# Patient Record
Sex: Female | Born: 1975 | Race: White | Hispanic: No | Marital: Married | Smoking: Never smoker
Health system: Southern US, Community
[De-identification: ages and names within clinical notes are randomized; demographics above are authoritative.]

## PROBLEM LIST (undated history)

## (undated) DIAGNOSIS — F32A Depression, unspecified: Secondary | ICD-10-CM

## (undated) DIAGNOSIS — Z808 Family history of malignant neoplasm of other organs or systems: Secondary | ICD-10-CM

## (undated) DIAGNOSIS — F419 Anxiety disorder, unspecified: Secondary | ICD-10-CM

## (undated) DIAGNOSIS — F329 Major depressive disorder, single episode, unspecified: Secondary | ICD-10-CM

## (undated) DIAGNOSIS — Z803 Family history of malignant neoplasm of breast: Secondary | ICD-10-CM

## (undated) HISTORY — DX: Anxiety disorder, unspecified: F41.9

## (undated) HISTORY — DX: Family history of malignant neoplasm of breast: Z80.3

## (undated) HISTORY — PX: NO PAST SURGERIES: SHX2092

## (undated) HISTORY — DX: Family history of malignant neoplasm of other organs or systems: Z80.8

## (undated) HISTORY — DX: Depression, unspecified: F32.A

## (undated) HISTORY — DX: Major depressive disorder, single episode, unspecified: F32.9

---

## 1998-11-23 ENCOUNTER — Other Ambulatory Visit: Admission: RE | Admit: 1998-11-23 | Discharge: 1998-11-23 | Payer: Self-pay | Admitting: Obstetrics and Gynecology

## 1999-11-22 ENCOUNTER — Other Ambulatory Visit: Admission: RE | Admit: 1999-11-22 | Discharge: 1999-11-22 | Payer: Self-pay | Admitting: Obstetrics and Gynecology

## 2001-01-01 ENCOUNTER — Other Ambulatory Visit: Admission: RE | Admit: 2001-01-01 | Discharge: 2001-01-01 | Payer: Self-pay | Admitting: Obstetrics and Gynecology

## 2002-01-21 ENCOUNTER — Other Ambulatory Visit: Admission: RE | Admit: 2002-01-21 | Discharge: 2002-01-21 | Payer: Self-pay | Admitting: Obstetrics and Gynecology

## 2002-02-14 ENCOUNTER — Ambulatory Visit (HOSPITAL_COMMUNITY): Admission: RE | Admit: 2002-02-14 | Discharge: 2002-02-14 | Payer: Self-pay | Admitting: Obstetrics and Gynecology

## 2002-02-14 ENCOUNTER — Encounter: Payer: Self-pay | Admitting: Obstetrics and Gynecology

## 2003-01-27 ENCOUNTER — Other Ambulatory Visit: Admission: RE | Admit: 2003-01-27 | Discharge: 2003-01-27 | Payer: Self-pay | Admitting: Obstetrics and Gynecology

## 2003-05-25 ENCOUNTER — Inpatient Hospital Stay (HOSPITAL_COMMUNITY): Admission: AD | Admit: 2003-05-25 | Discharge: 2003-05-25 | Payer: Self-pay | Admitting: Obstetrics and Gynecology

## 2003-05-25 IMAGING — US US OB COMP LESS 14 WK
1 series · 18 of 28 positions shown · non-contrast
Comparison: none

CLINICAL DATA: Abnormal uterine bleeding.
 TRANSABDOMINAL AND TRANSVAGINAL PELVIC OBSTETRICAL ULTRASOUND <14 WEEKS 
 There is a gestational sac with a mean sac diameter of 1 cm consistent with 5 weeks 4 days gestation.  A yolk sac is visible.  An embryo was not currently visible.  There is a small subchorionic hemorrhage.  The right ovary is normal.  There is a 1.6 x 1.4 x 2.0 cm hemorrhagic corpus luteum cyst on the left ovary.  There is no free fluid.  Transverse images of the uterus indicate that the patient appears to have a septated uterus.  The external contour of the fundus does not suggest a bicornuate uterus.  
 IMPRESSION
 Septated endometrial cavity.  Early pregnancy in the left horn.  Small subchorionic hemorrhage.

[Series 1: us ob comp<14 wk · 18 of 44 slices shown]
[im 1/44]
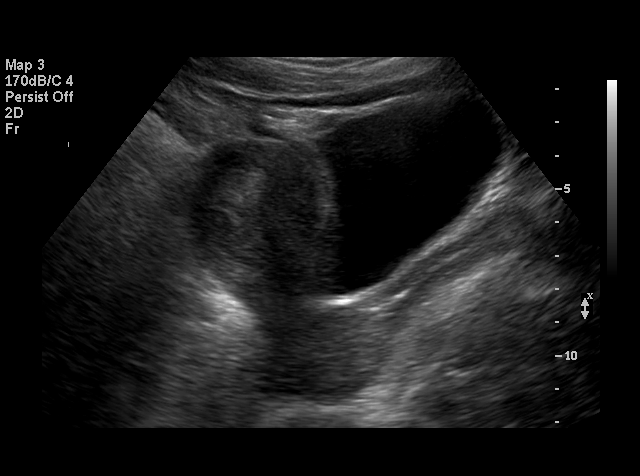
[im 4/44]
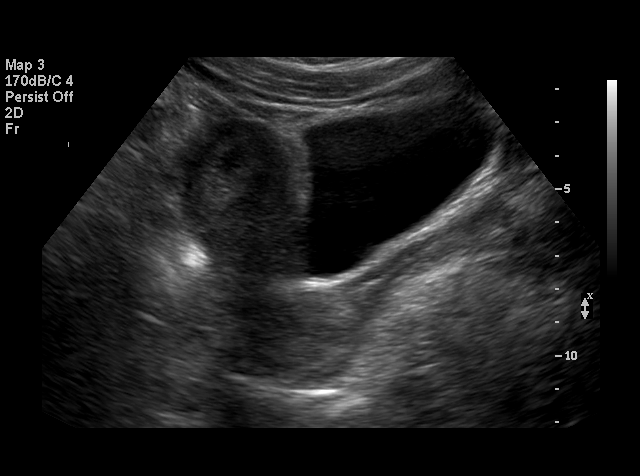
[im 5/44]
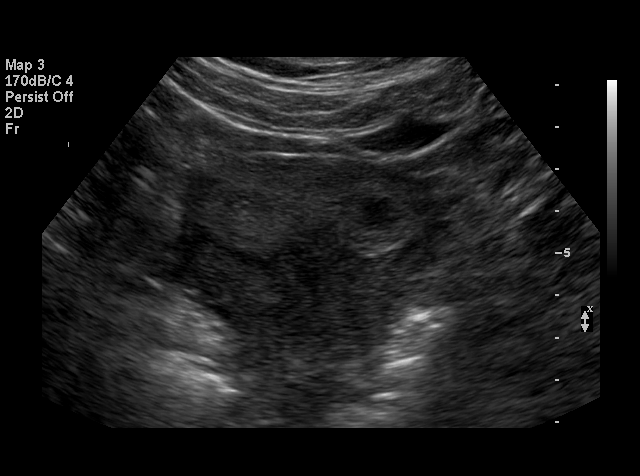
[im 8/44]
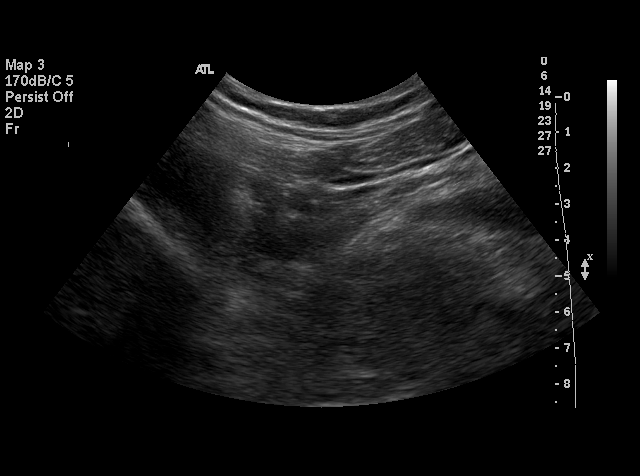
[im 12/44]
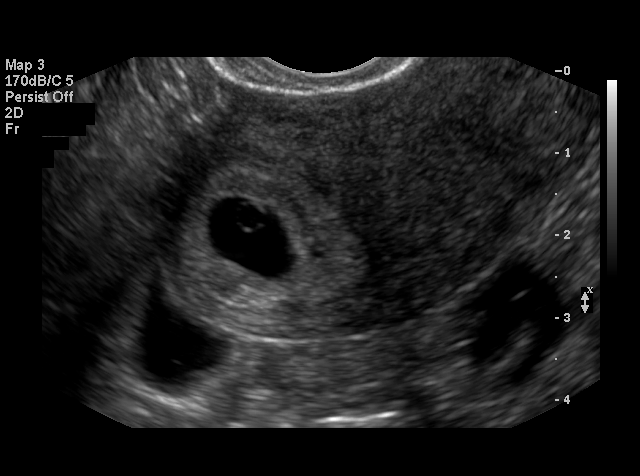
[im 13/44]
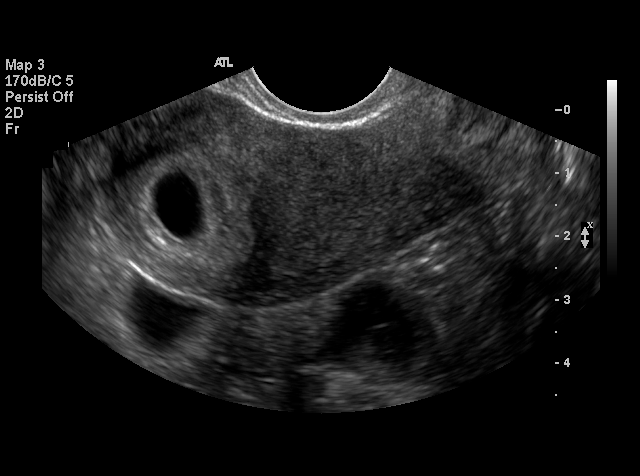
[im 16/44]
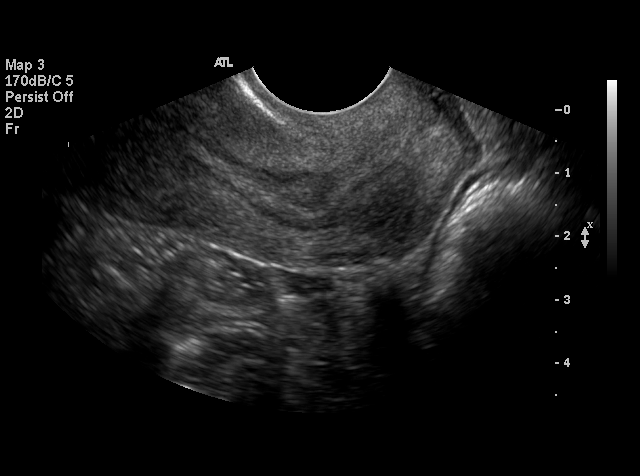
[im 18/44]
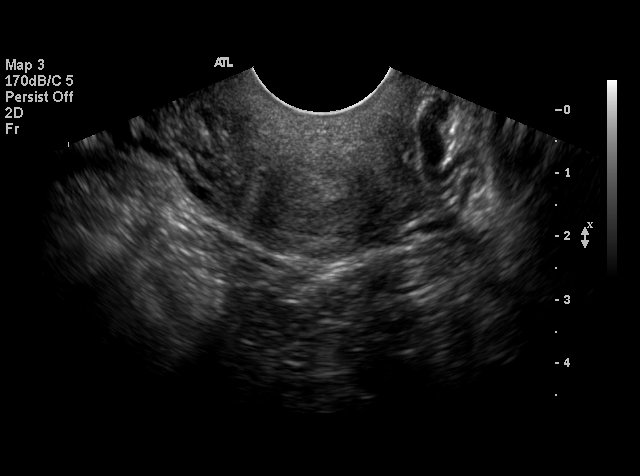
[im 21/44]
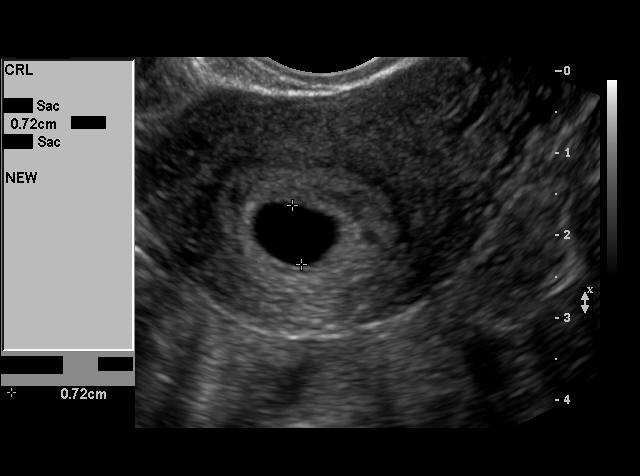
[im 23/44]
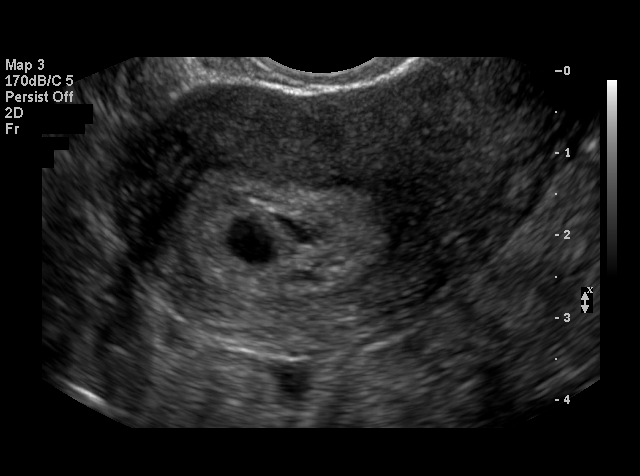
[im 26/44]
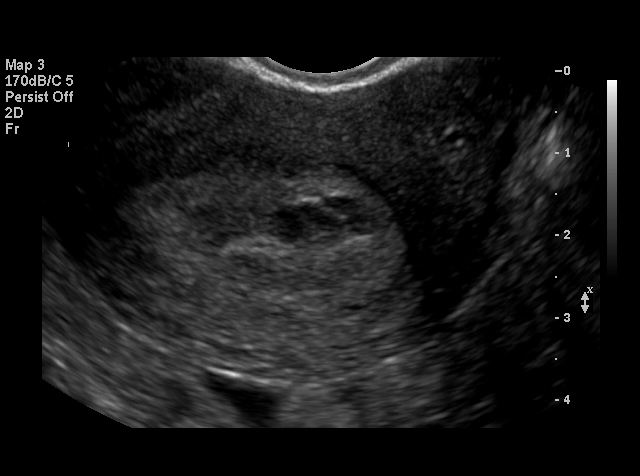
[im 28/44]
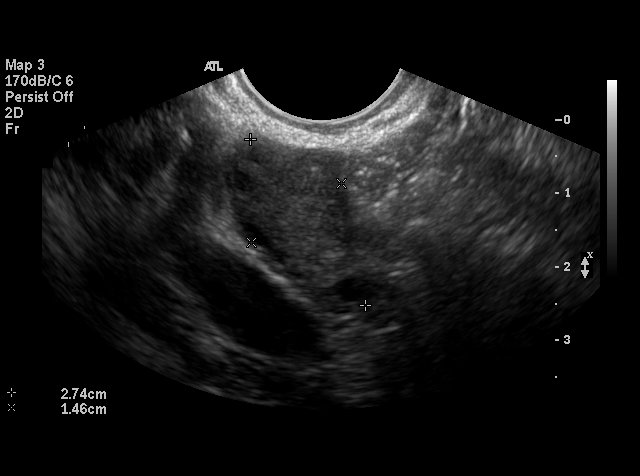
[im 31/44]
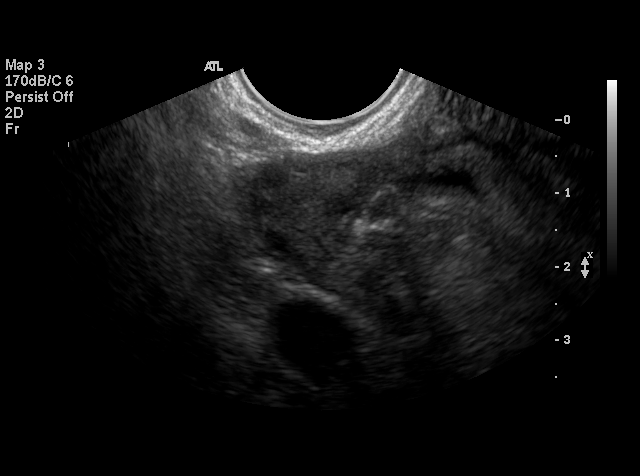
[im 34/44]
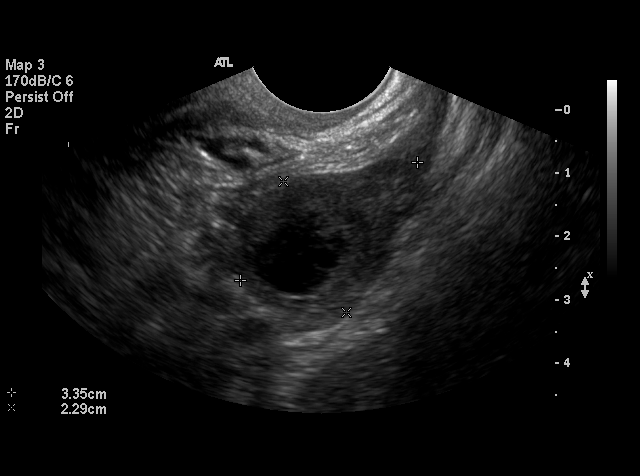
[im 36/44]
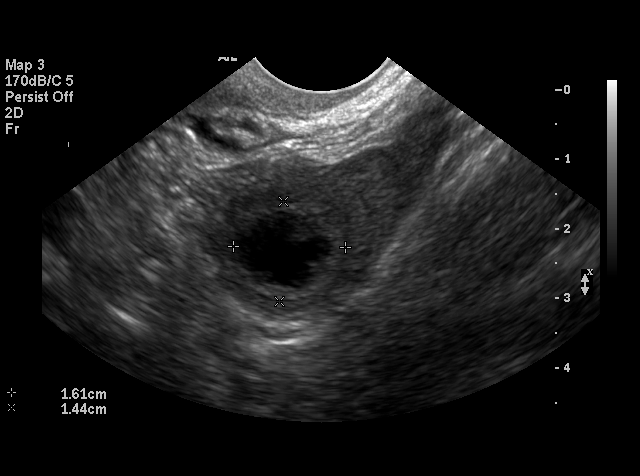
[im 39/44]
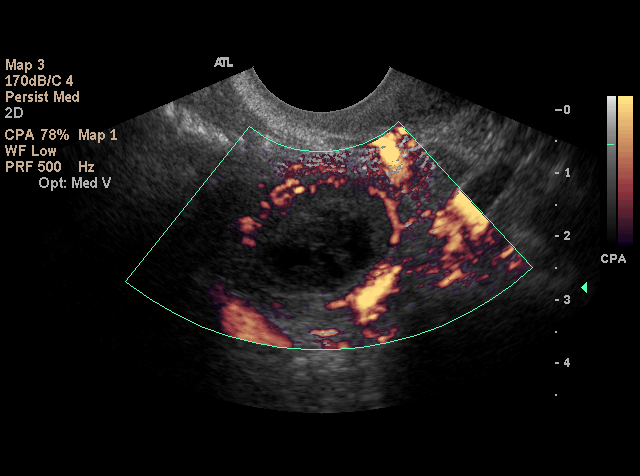
[im 40/44]
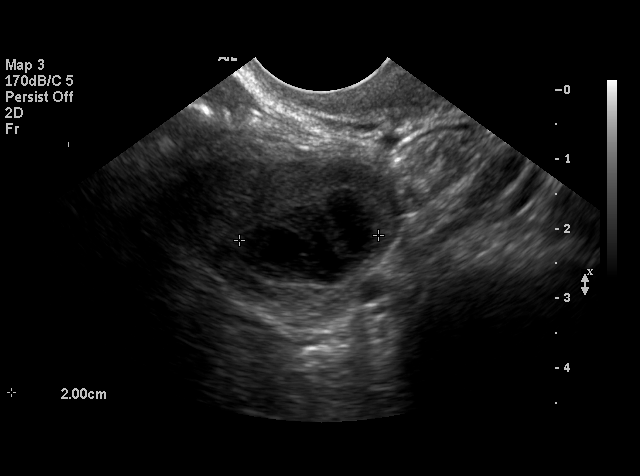
[im 44/44]
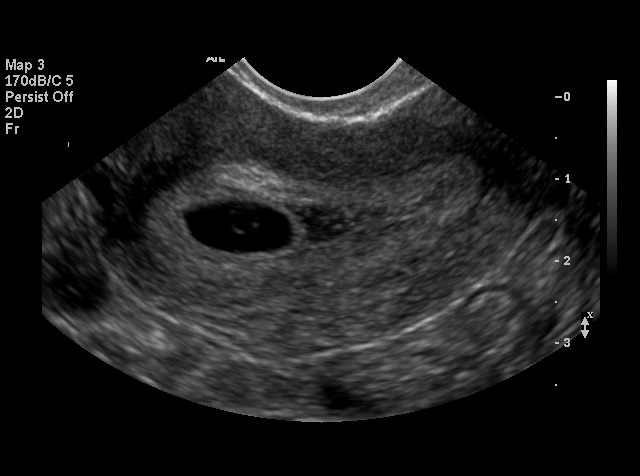

[18 of 28 positions shown; findings below may reference images not displayed]

## 2003-10-15 ENCOUNTER — Ambulatory Visit (HOSPITAL_COMMUNITY): Admission: RE | Admit: 2003-10-15 | Discharge: 2003-10-15 | Payer: Self-pay | Admitting: Obstetrics and Gynecology

## 2003-10-15 IMAGING — US US RETROPERITONEAL COMPLETE
1 series · 14 of 25 positions shown · non-contrast
Comparison: none

CLINICAL DATA: Right-sided pain. Hematuria.  26 weeks pregnant.  
 ULTRASOUND OF THE KIDNEYS 
 No comparison.
 The right kidney appears normal and measures 12.0 cm.  The left kidney appears normal measuring 10.9 cm.  There is no hydronephrosis.  The urinary bladder appears normal.  No renal calculi are identified. 
 IMPRESSION
 Negative.

[Series 1: unknown · 0.28mm/px · 14 of 26 slices shown]
[im 1/26]
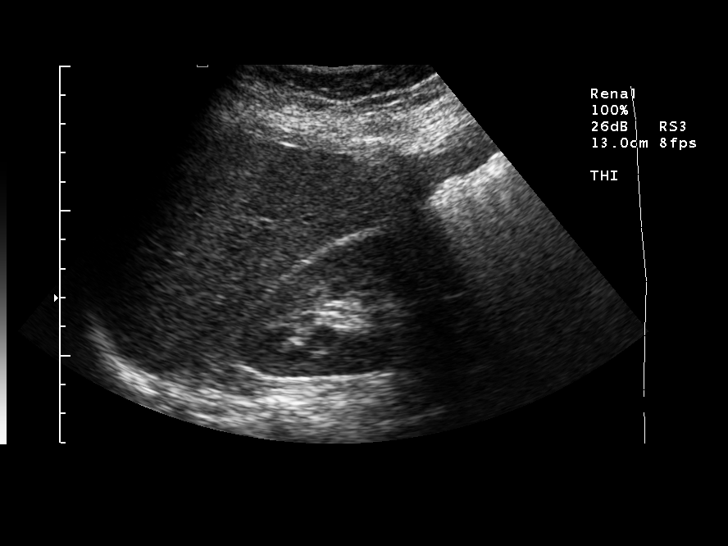
[im 3/26]
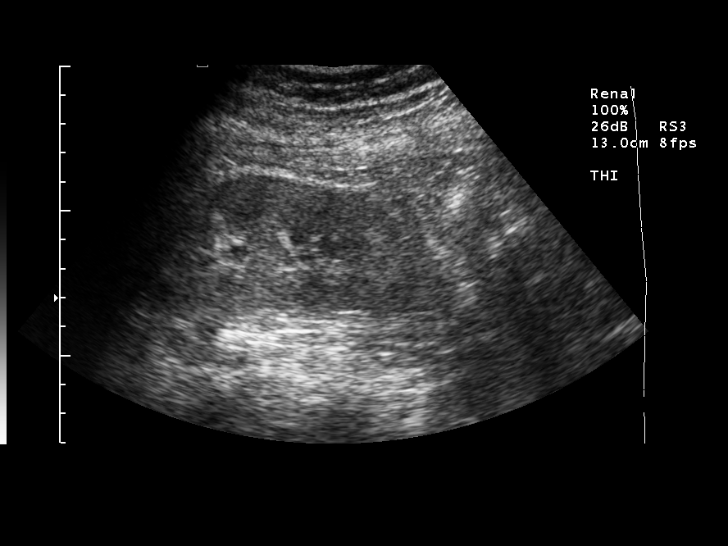
[im 5/26]
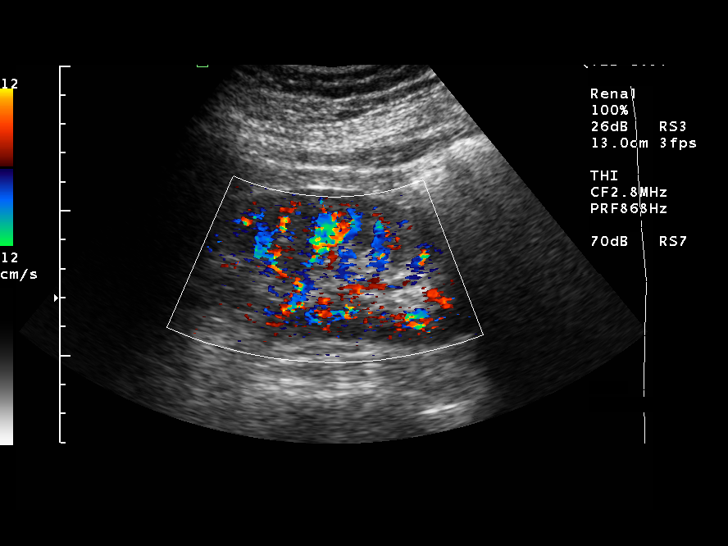
[im 7/26]
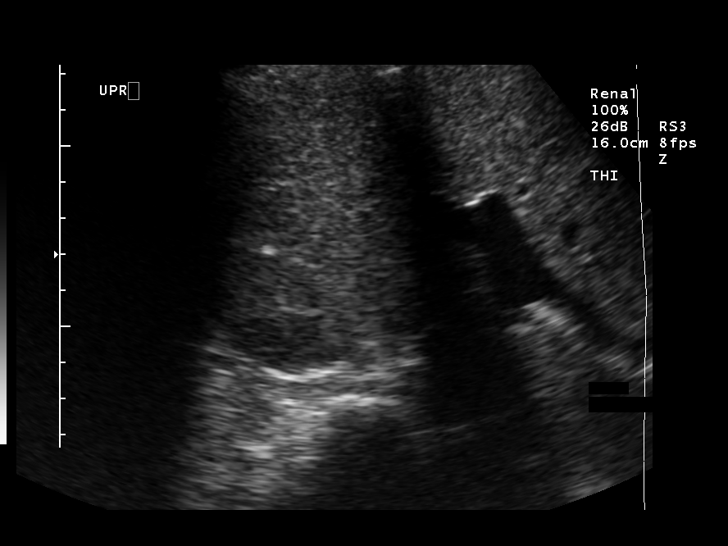
[im 9/26]
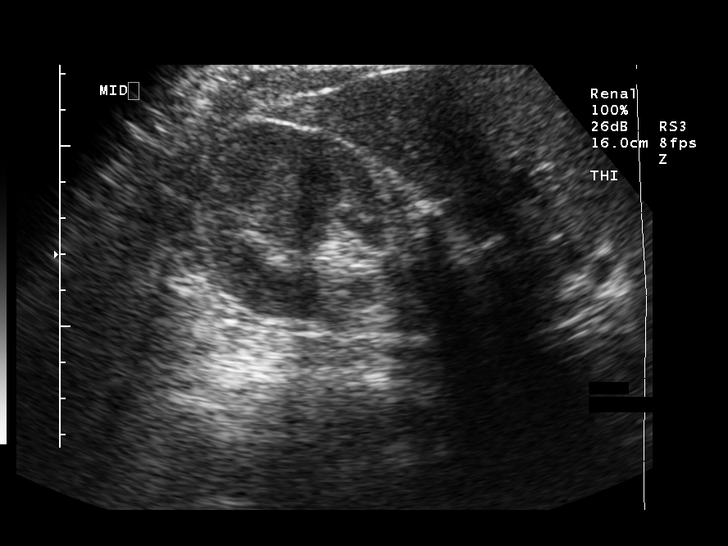
[im 10/26]
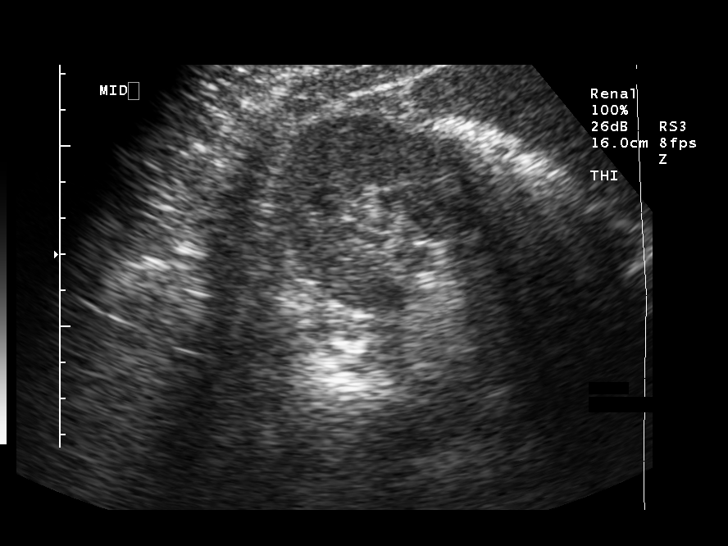
[im 12/26]
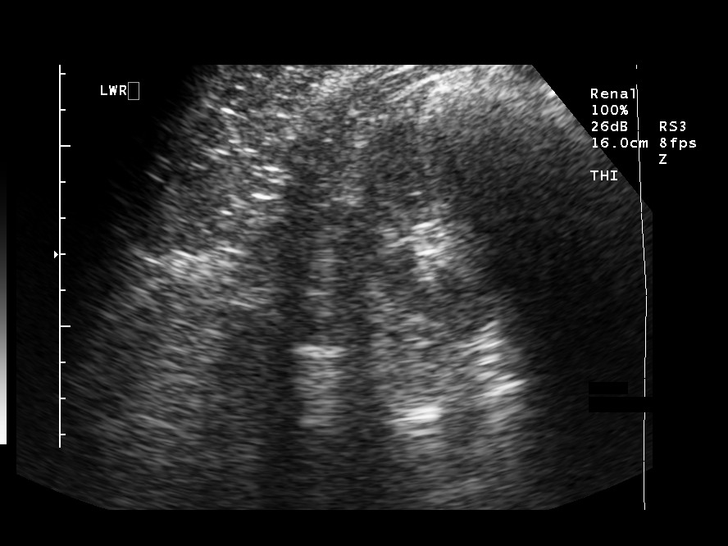
[im 14/26]
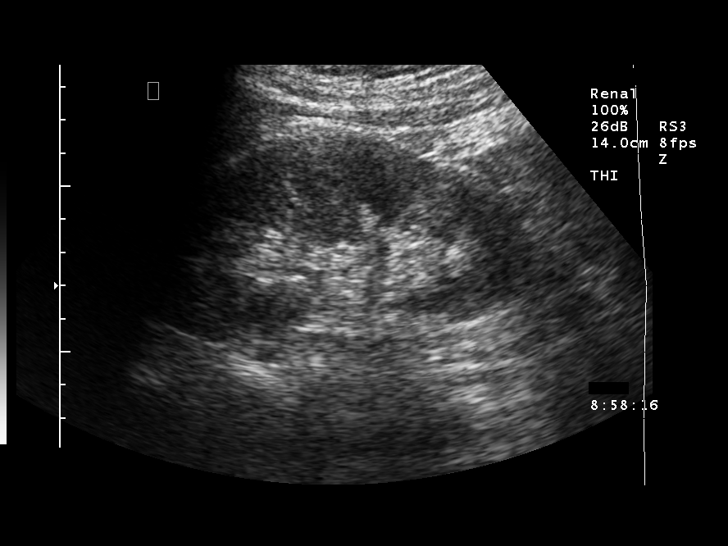
[im 16/26]
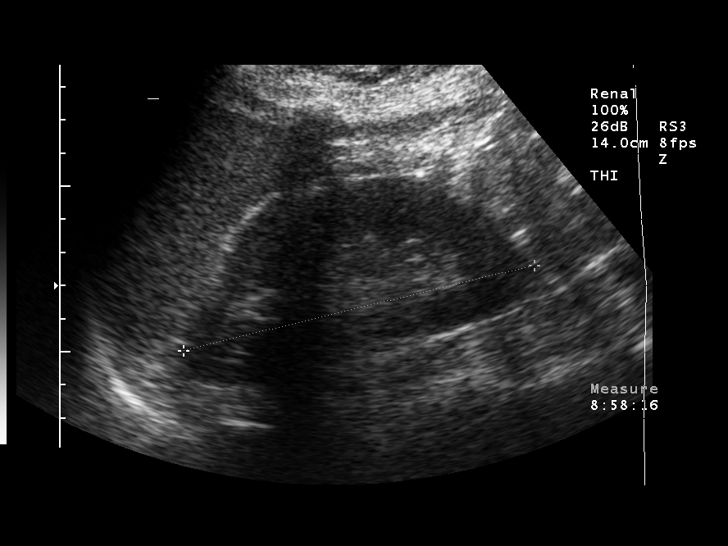
[im 17/26]
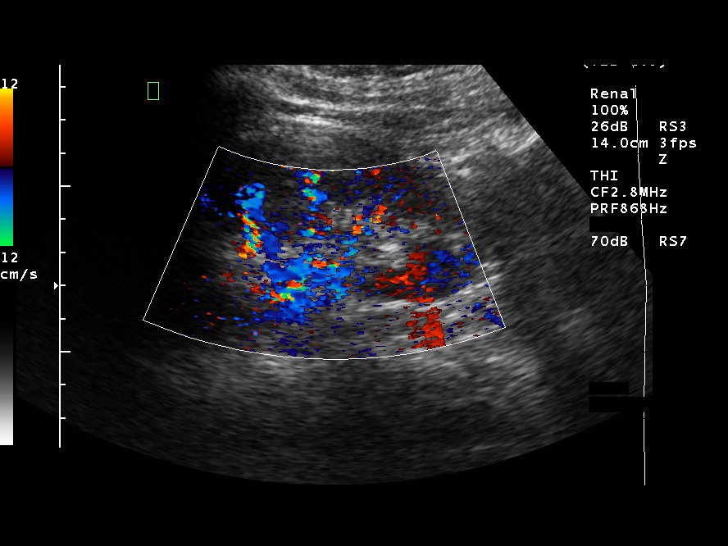
[im 19/26]
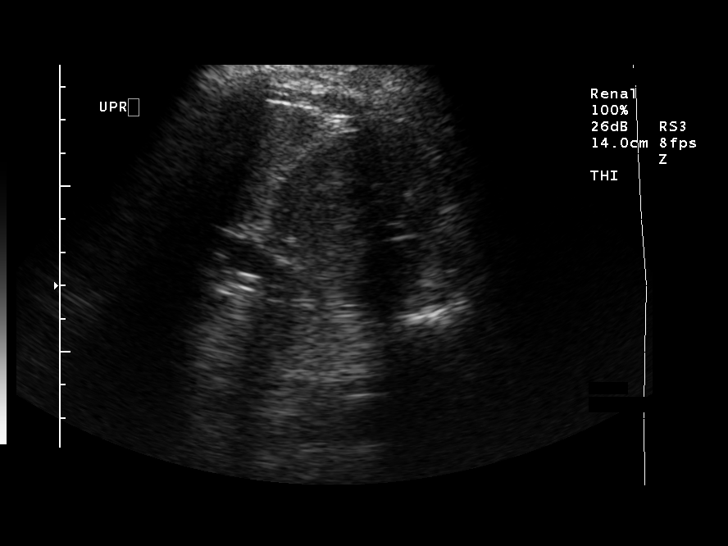
[im 21/26]
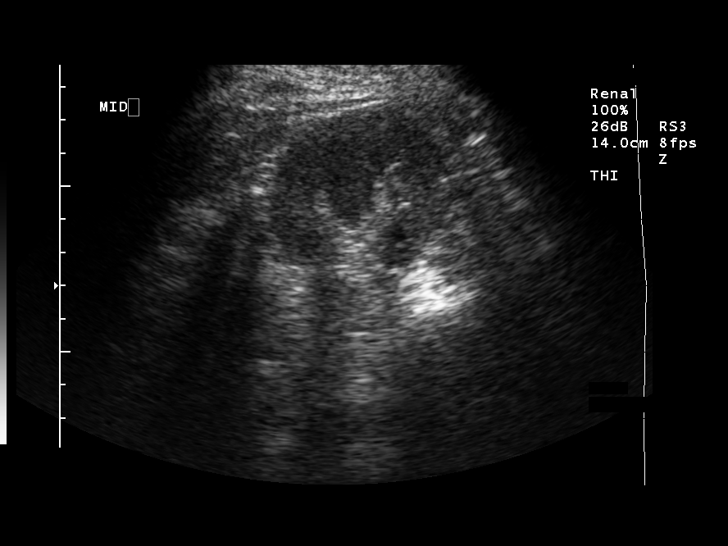
[im 23/26]
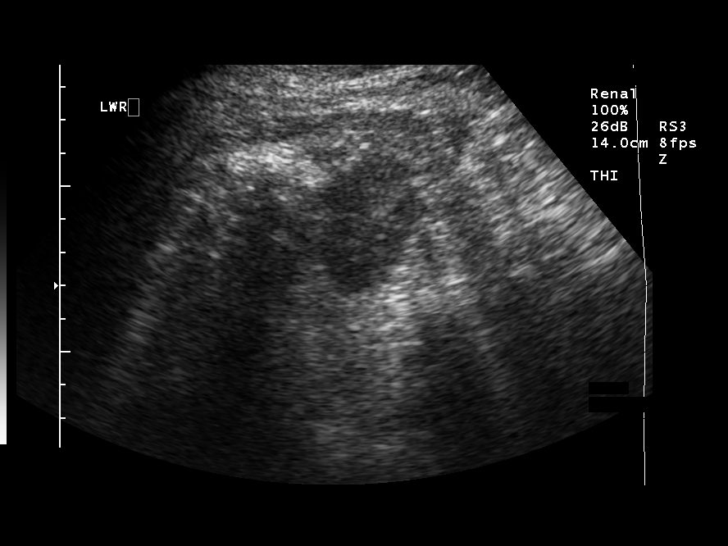
[im 26/26]
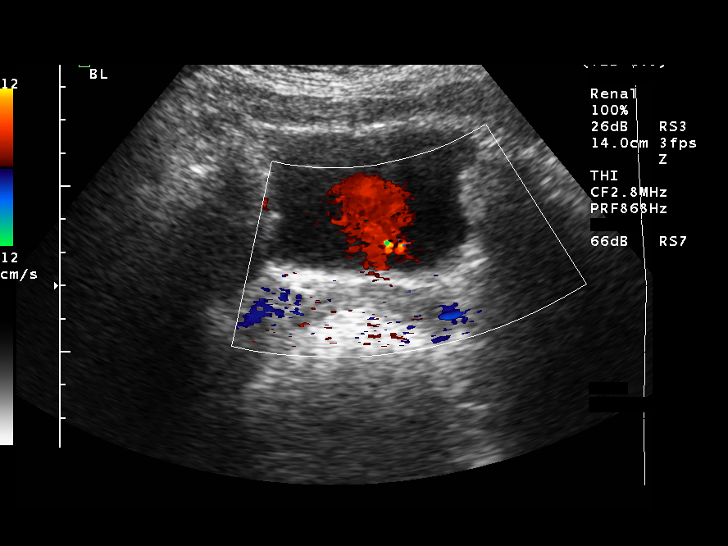

[14 of 25 positions shown; findings below may reference images not displayed]

## 2004-01-08 ENCOUNTER — Inpatient Hospital Stay (HOSPITAL_COMMUNITY): Admission: AD | Admit: 2004-01-08 | Discharge: 2004-01-11 | Payer: Self-pay | Admitting: Obstetrics and Gynecology

## 2004-01-09 ENCOUNTER — Encounter (INDEPENDENT_AMBULATORY_CARE_PROVIDER_SITE_OTHER): Payer: Self-pay | Admitting: Specialist

## 2004-02-16 ENCOUNTER — Other Ambulatory Visit: Admission: RE | Admit: 2004-02-16 | Discharge: 2004-02-16 | Payer: Self-pay | Admitting: Obstetrics and Gynecology

## 2005-03-02 ENCOUNTER — Other Ambulatory Visit: Admission: RE | Admit: 2005-03-02 | Discharge: 2005-03-02 | Payer: Self-pay | Admitting: Obstetrics and Gynecology

## 2006-03-06 ENCOUNTER — Other Ambulatory Visit: Admission: RE | Admit: 2006-03-06 | Discharge: 2006-03-06 | Payer: Self-pay | Admitting: Obstetrics and Gynecology

## 2008-03-27 ENCOUNTER — Ambulatory Visit: Payer: Self-pay | Admitting: Internal Medicine

## 2008-07-10 ENCOUNTER — Ambulatory Visit: Payer: Self-pay | Admitting: Internal Medicine

## 2010-09-23 NOTE — H&P (Signed)
NAME:  DANILLE, OPPEDISANO                           ACCOUNT NO.:  [id number]   MEDICAL RECORD NO.:  [id number]                   PATIENT TYPE:  INP   LOCATION:  9167                                 FACILITY:  [facility]   PHYSICIAN:  Janine Limbo, M.D.            DATE OF BIRTH:  1975-05-15   DATE OF ADMISSION:  01/08/2004  DATE OF DISCHARGE:                                HISTORY & PHYSICAL   HISTORY OF PRESENT ILLNESS:  This is a 35 year old gravida 1 para 0 at 28  and 0-sevenths weeks who presents to the office with gross leaking of clear  fluid from the vagina.  She reports only rare contractions.  Upon exam there  is gross evidence of ruptured membranes.  Her cervix remains at 1 cm  dilated.  She is therefore being admitted for observation and labor  management.  Pregnancy has been followed by the nurse midwife service and  remarkable for:  1.  First trimester bleeding.  2.  Bilateral renal pyelectasis (fetal).  3.  Hematuria related to urinary tract infection.  4.  Group B strep negative.   OBSTETRICAL HISTORY:  The patient is a primigravida.   HISTORY OF CURRENT PREGNANCY:  The patient entered care at [redacted] weeks  gestation.  She had some first trimester bleeding which resolved.  Her labs  came back within normal limits.  She had a normal ultrasound except for  fetal renal pyelectasis bilaterally.  This persisted on her second  ultrasound at 23 weeks.  She did have some hematuria at 25 weeks which was  treated with Macrobid.  Urine culture was negative but hematuria resolved.  She had an ultrasound at 30 weeks which showed persistent pyelectasis  bilaterally.  The rest of her pregnancy proceeded uneventfully until she  ruptured her membranes today.   MEDICAL HISTORY:  Remarkable for childhood varicella, childhood  pyelonephritis, and a fractured arm at age 45.   SURGICAL HISTORY:  Remarkable for wisdom teeth at age 23.   FAMILY HISTORY:  Remarkable for a grandfather with MI.   Grandmother and  father with diabetes.  Grandmother with stroke.  Grandfather with arthritis.   GENETIC HISTORY:  Remarkable for father-of-the-baby's grandmother who is a  twin, and a cousin with twins.   SOCIAL HISTORY:  The patient is married to [employer], who is involved and  supportive.  She is of the [employer].  She denies any alcohol, tobacco,  or drug use.   ALLERGIES:  None.   MEDICATIONS:  Prenatal vitamins.   PRENATAL LABORATORY DATA:  Hemoglobin 12.3, platelets 278.  Blood type A  positive, antibody screen negative.  RPR nonreactive.  Rubella immune.  Hepatitis B negative.  HIV negative. Pap test normal.  Gonorrhea negative,  chlamydia negative.  Cystis fibrosis negative.  Group B strep negative.   ASSESSMENT:  1.  Intrauterine pregnancy at 38 weeks.  2.  Premature rupture of  membranes at term.  3.  Prodromal labor/no labor.  4.  Group B streptococcus negative.   PLAN:  1.  Admit to birthing suites; Dr. Stefano Gaul notified.  2.  Routine C.N.M. orders.  3.  Options were discussed with the patient regarding expectant versus      augmentation of labor management and the patient will decide later.     Marie L. Williams, C.N.M.                 Janine Limbo, M.D.    MLW/MEDQ  D:  01/08/2004  T:  01/08/2004  Job:  [id number]

## 2010-09-23 NOTE — Discharge Summary (Signed)
NAME:  Lisa Golden, Lisa Golden                           ACCOUNT NO.:  [id number]   MEDICAL RECORD NO.:  [id number]                   PATIENT TYPE:  INP   LOCATION:  9115                                 FACILITY:  [facility]   PHYSICIAN:  Janine Limbo, M.D.            DATE OF BIRTH:  July 09, 1975   DATE OF ADMISSION:  01/08/2004  DATE OF DISCHARGE:  01/11/2004                                 DISCHARGE SUMMARY   ADMISSION DIAGNOSES:  1.  Intrauterine pregnancy at 38 weeks.  2.  Early labor.  3.  Spontaneous rupture of membranes.  4.  Negative group B Streptococcus.   DISCHARGE DIAGNOSES:  1.  Intrauterine pregnancy at 38 weeks.  2.  Early labor.  3.  Spontaneous rupture of membranes.  4.  Negative group B Streptococcus.  5.  Chorioamnionitis.  6.  Maternal exhaustion.  7.  Nuchal cord x2.  8.  Bilateral vaginal laceration.   HOSPITAL PROCEDURES:  1.  Pitocin augmentation.  2.  Epidural.  3.  Vacuum extraction vaginal delivery.  4.  Repair of bilateral vaginal lacerations.   HOSPITAL COURSE:  The patient was admitted with rupture of membranes in  early labor.  She was observed expectantly.  When labor did not progress  very far, she elected to proceed with Pitocin augmentation.  She progressed  well after that but developed a fever and maternal exhaustion.  When the  vertex was a +1 station, options were discussed and the patient elected to  proceed with a vacuum extraction vaginal delivery, which was accomplished of  a female infant named Jean Rosenthal, weighing 7 pounds 7 ounces, Apgars 6 and 9,  cord pH 7.32.  Bilateral vaginal lacerations were repaired by Dr. Stefano Gaul.  EBL was 350 mL.  She was taken to postpartum.  She was doing well, bottle-  feeding the infant.  On postpartum day #1 she continued to do well.  Hemoglobin was 10.5, fundus was firm, lochia was moderate.  On postpartum  day #2 she was ready to go home.  Vital signs were stable, fundus was firm,  lochia small, extremities  within normal limits, and she was deemed to have  received full benefit of her hospital stay and was discharged home.   DISCHARGE MEDICATIONS:  Motrin 600 mg p.o. q.6h. p.r.n.   DISCHARGE LABORATORY DATA:  White blood cell count 11.7, hemoglobin 10.5,  platelet count 192.   DISCHARGE INSTRUCTIONS:  Per [facility] handout.   DISCHARGE FOLLOW-UP:  In six weeks or p.r.n. at [facility].   CONDITION ON DISCHARGE:  Good.     Marie L. Williams, C.N.M.                 Janine Limbo, M.D.    MLW/MEDQ  D:  01/11/2004  T:  01/12/2004  Job:  [id number]

## 2010-09-23 NOTE — Op Note (Signed)
NAME:  Lisa Golden, Lisa Golden                           ACCOUNT NO.:  [id number]   MEDICAL RECORD NO.:  [id number]                   PATIENT TYPE:  INP   LOCATION:  9115                                 FACILITY:  [facility]   PHYSICIAN:  Janine Limbo, M.D.            DATE OF BIRTH:  12-Sep-1975   DATE OF PROCEDURE:  01/09/2004  DATE OF DISCHARGE:                                 OPERATIVE REPORT   PREOPERATIVE DIAGNOSES:  1.  Term intrauterine pregnancy.  2.  Chorioamnionitis.  3.  Maternal exhaustion.   POSTOPERATIVE DIAGNOSES:  1.  Term intrauterine pregnancy.  2.  Chorioamnionitis.  3.  Maternal exhaustion.  4.  Nuchal cord x2.  5.  Bilateral vaginal laceration.   PROCEDURE:  1.  Vacuum extraction vaginal delivery.  2.  Repair of bilateral vaginal lacerations.   SURGEON:  Janine Limbo, M.D.   FIRST ASSISTANT:  Renaldo Reel. Emilee Hero, C.N.M.   ANESTHESIA:  Epidural, local 1% Xylocaine.   DISPOSITION:  Lisa Golden is a 35 year old female, gravida 1, para 0, who  presented at 75 weeks' gestation on January 08, 2004.  On presentation, her  cervix was 1 cm dilated and 80% effaced.   DESCRIPTION OF PROCEDURE:  She was contracting regularly, and she had  spontaneous rupture of membranes.  The patient labored slowly and her labor  was augmented with Pitocin.  She was completely dilated at 11:06 p.m. on  January 08, 2004.  The patient pushed effectively.  However, after about 2-  1/2 hours of pushing, the patient began to become exhausted.  The fetal head  was visible at the introitus.  We discussed our options for care at that  point.  We discussed continued pushing, operative vaginal delivery, and  cesarean delivery.  The risks and benefits of each of those options were  outlined.  The patient and her husband elected to proceed with operative  vaginal delivery.  The risks and benefits of vacuum extraction and vaginal  delivery versus forceps vaginal deliveries were then reviewed.  The  patient  and husband decided to proceed with vacuum extraction.  We reviewed the  risks of vacuum extraction including, caput formation, hematoma formation,  the rare risks of intracranial bleeding, and the risks that the vacuum  extraction would be unsuccessful and that we would still need to proceed  with cesarean delivery.  They were ready to proceed at that point.   DESCRIPTION OF PROCEDURE:  The patient was already in the lithotomy  position.  The patient had been prepped and draped.  The cervix was  completely dilated, 100% effaced, and the fetal head was visible at the  introitus.  The Kiwi vacuum extractor was applied, and with one prolonged  push the patient was able to deliver the fetal head.  The mouth and nose  were suctioned.  The patient was noted to have two tight nuchal cords.  The  cords were clamped to the perineum and the father was allowed to cut the  cord.  The remainder of the infant was then delivered.  The infant was taken  to the fetal warmer.  Routine cord blood studies were obtained.  The  placenta was removed.  The cord pH was 7.32.  The patient was noted to have  bilateral second degree lacerations at the introitus.  The perineum was  injected with 28 mL of 1% Xylocaine.  Then 3-0 Vicryl was used to close the  laceration.  Hemostasis was adequate.  The patient tolerated the procedure  well.  The infant remained in the room with the parents for bonding for a  short time and then was taken to the nursery.  the patient was returned to  the supine position and allowed to rest.                                               Janine Limbo, M.D.    AVS/MEDQ  D:  01/09/2004  T:  01/11/2004  Job:  [id number]

## 2010-11-04 ENCOUNTER — Encounter: Payer: Self-pay | Admitting: Internal Medicine

## 2010-11-04 ENCOUNTER — Ambulatory Visit (INDEPENDENT_AMBULATORY_CARE_PROVIDER_SITE_OTHER): Payer: Managed Care, Other (non HMO) | Admitting: Internal Medicine

## 2010-11-04 VITALS — BP 128/86 | Temp 99.9°F | Ht 67.0 in | Wt 159.0 lb

## 2010-11-04 DIAGNOSIS — N943 Premenstrual tension syndrome: Secondary | ICD-10-CM

## 2010-11-04 DIAGNOSIS — F53 Postpartum depression: Secondary | ICD-10-CM

## 2010-11-04 DIAGNOSIS — F411 Generalized anxiety disorder: Secondary | ICD-10-CM

## 2010-11-04 DIAGNOSIS — F419 Anxiety disorder, unspecified: Secondary | ICD-10-CM | POA: Insufficient documentation

## 2010-11-04 DIAGNOSIS — O99345 Other mental disorders complicating the puerperium: Secondary | ICD-10-CM | POA: Insufficient documentation

## 2010-11-04 DIAGNOSIS — IMO0002 Reserved for concepts with insufficient information to code with codable children: Secondary | ICD-10-CM

## 2010-11-04 DIAGNOSIS — F329 Major depressive disorder, single episode, unspecified: Secondary | ICD-10-CM

## 2010-11-04 DIAGNOSIS — F32A Depression, unspecified: Secondary | ICD-10-CM

## 2010-11-04 LAB — T4, FREE: Free T4: 1.23 ng/dL (ref 0.80–1.80)

## 2010-11-04 LAB — TSH: TSH: 2.302 u[IU]/mL (ref 0.350–4.500)

## 2010-11-04 NOTE — Progress Notes (Signed)
  Subjective:    Patient ID: Lisa Golden, female    DOB: [birth year], 35 y.o.   MRN: [id number]  HPI  patient has not been seen here since 2009. She is married and has a 21-year-old son. Patient says that after the birth of her son she developed postpartum depression and took Serafem for depression for several months with good results. She subsequently weaned off of that and has had no recurrence of symptoms.  In January, her son was diagnosed with a possible demyelinating illness. He lost the vision suddenly in his left eye. He was placed on steroids for several months. His prognosis is  uncertain at this point as he had a relapse after the steroids were weaned off. She has found herself worrying about his condition constantly. She feels she is becoming hypervigilant, overprotective and perhaps overbearing. She called today asking for an appointment, crying on the phone. Her menstrual period is due to start next week. Has noticed significant premenstrual symptoms for the past few months. Is not on any oral contraceptives. Says husband has fertility issues and son was hard to conceive. Denies any suicidal thoughts. Tearful in the office today and telling the story about her son. He admits to being both anxious and depressed. Decreased energy level although just had a a good family vacation last week.    Review of Systems     Objective:   Physical Exam  neck supple no adenopathy no thyromegaly; chest clear; cardiac exam regular rate and rhythm normal S1/S2        Assessment & Plan:  Anxiety  Depression  Premenstrual syndrome  Plan start Prozac 20 mg daily #30 with 2 refills. See again in 6 weeks. For anxiety, Xanax 0.5 mg (#60) 1/2-1 by mouth twice daily as needed for anxiety with 2 refills. Recommended counselor.  Check TSH.

## 2010-11-05 ENCOUNTER — Encounter: Payer: Self-pay | Admitting: Internal Medicine

## 2010-12-08 ENCOUNTER — Ambulatory Visit (INDEPENDENT_AMBULATORY_CARE_PROVIDER_SITE_OTHER): Payer: Managed Care, Other (non HMO) | Admitting: Psychology

## 2010-12-08 DIAGNOSIS — F4323 Adjustment disorder with mixed anxiety and depressed mood: Secondary | ICD-10-CM

## 2010-12-16 ENCOUNTER — Encounter: Payer: Self-pay | Admitting: Internal Medicine

## 2010-12-16 ENCOUNTER — Ambulatory Visit (INDEPENDENT_AMBULATORY_CARE_PROVIDER_SITE_OTHER): Payer: Managed Care, Other (non HMO) | Admitting: Internal Medicine

## 2010-12-16 VITALS — BP 108/84 | HR 84 | Temp 100.0°F | Ht 67.0 in | Wt 158.0 lb

## 2010-12-16 DIAGNOSIS — R002 Palpitations: Secondary | ICD-10-CM

## 2010-12-16 DIAGNOSIS — F419 Anxiety disorder, unspecified: Secondary | ICD-10-CM

## 2010-12-16 DIAGNOSIS — F411 Generalized anxiety disorder: Secondary | ICD-10-CM

## 2010-12-16 NOTE — Progress Notes (Signed)
  Subjective:    Patient ID: Lisa Golden, female    DOB: 08-07-1975, 35 y.o.   MRN: [id number]  HPI  For follow up of anxiety depression. Prozac helping a great deal. Saw Dr. Evalina Field for counseling and has another appt 8/21.  No side effects with Prozac. Has not had to take much Xanax. Less obsessing and worrying. Recent TFTs normal.Had palpitaions very briefly in car one day. Explained these are aggravated by caffeine, OTC decongestants and stress.    Review of Systems     Objective:   Physical Exam  Constitutional: She is oriented to person, place, and time.  Cardiovascular: Normal rate and regular rhythm.   No murmur heard. Pulmonary/Chest: Effort normal and breath sounds normal. She has no wheezes. She has no rales.  Neurological: She is alert and oriented to person, place, and time.  Psychiatric: She has a normal mood and affect. Her behavior is normal. Judgment and thought content normal.            Assessment & Plan:  Anxiety/ depression Palpitations-benign Refill Prozac for 6 months. See in 6 months

## 2010-12-27 ENCOUNTER — Ambulatory Visit: Payer: Managed Care, Other (non HMO) | Admitting: Psychology

## 2010-12-29 ENCOUNTER — Ambulatory Visit (INDEPENDENT_AMBULATORY_CARE_PROVIDER_SITE_OTHER): Payer: Managed Care, Other (non HMO) | Admitting: Psychology

## 2010-12-29 DIAGNOSIS — F4323 Adjustment disorder with mixed anxiety and depressed mood: Secondary | ICD-10-CM

## 2011-01-17 ENCOUNTER — Ambulatory Visit (INDEPENDENT_AMBULATORY_CARE_PROVIDER_SITE_OTHER): Payer: Managed Care, Other (non HMO) | Admitting: Psychology

## 2011-01-17 DIAGNOSIS — F4323 Adjustment disorder with mixed anxiety and depressed mood: Secondary | ICD-10-CM

## 2011-01-24 ENCOUNTER — Encounter: Payer: Self-pay | Admitting: Internal Medicine

## 2011-01-24 ENCOUNTER — Ambulatory Visit (INDEPENDENT_AMBULATORY_CARE_PROVIDER_SITE_OTHER): Payer: Managed Care, Other (non HMO) | Admitting: Internal Medicine

## 2011-01-24 DIAGNOSIS — M26629 Arthralgia of temporomandibular joint, unspecified side: Secondary | ICD-10-CM

## 2011-01-24 DIAGNOSIS — M2669 Other specified disorders of temporomandibular joint: Secondary | ICD-10-CM

## 2011-01-24 DIAGNOSIS — H6593 Unspecified nonsuppurative otitis media, bilateral: Secondary | ICD-10-CM

## 2011-01-24 DIAGNOSIS — H6590 Unspecified nonsuppurative otitis media, unspecified ear: Secondary | ICD-10-CM

## 2011-01-24 NOTE — Progress Notes (Signed)
  Subjective:    Patient ID: Lisa Golden, female    DOB: January 31, 1976, 35 y.o.   MRN: [id number]  HPI  35 year old white female who is going to [place] [employer] on September 15 with her husband and son. Has complaint of otalgia left ear. Thinks it may also be left TMJ because she has some tenderness along her TM joint she has noticed. No recent URI. Sometimes notices that she clenches her teeth in her sleep. Is on Prozac for depression. Takes Xanax when necessary for anxiety.    Review of Systems     Objective:   Physical Exam TMs are full and slightly pink bilaterally. Pharynx is clear. Neck is supple. Chest is clear. Mild tenderness left TM joint with some popping when opening her mouth.        Assessment & Plan:  Bilateral serous otitis media  Depression  Left TMJ syndrome  Anxiety  Plan: Zithromax Z-Pak (6 tablets) 2 tablets by mouth day one followed by 1 tablet by mouth days 2 through 5 with no refill. Flexeril 10 mg (#30) 1/2-1 by mouth each bedtime for TMJ syndrome. May take Advil or Aleve for inflammation of TM joint. Suggest over-the-counter decongestant for serous otitis media such as Sudafed PE

## 2011-02-28 ENCOUNTER — Ambulatory Visit: Payer: Managed Care, Other (non HMO) | Admitting: Psychology

## 2011-03-27 ENCOUNTER — Telehealth: Payer: Self-pay | Admitting: Internal Medicine

## 2011-03-27 MED ORDER — FLUOXETINE HCL 20 MG PO CAPS: 20.0000 mg | ORAL_CAPSULE | Freq: Every day | ORAL | Status: DC

## 2011-03-27 NOTE — Telephone Encounter (Signed)
Please call in generic Prozac 20 mg #90 with 1 refill. Please check and see when pt needs to be seen. Like to see patients on antianxiety and antidepressants q 6 months. MJB

## 2011-06-19 ENCOUNTER — Ambulatory Visit (INDEPENDENT_AMBULATORY_CARE_PROVIDER_SITE_OTHER): Payer: Managed Care, Other (non HMO) | Admitting: Internal Medicine

## 2011-06-19 ENCOUNTER — Encounter: Payer: Self-pay | Admitting: Internal Medicine

## 2011-06-19 VITALS — BP 110/80 | HR 76 | Temp 99.5°F | Wt 165.0 lb

## 2011-06-19 DIAGNOSIS — F329 Major depressive disorder, single episode, unspecified: Secondary | ICD-10-CM

## 2011-06-19 DIAGNOSIS — F32A Depression, unspecified: Secondary | ICD-10-CM

## 2011-06-19 NOTE — Patient Instructions (Addendum)
Continue Prozac 20 mg daily. Return in one year for physical exam or sooner if need be

## 2011-06-19 NOTE — Progress Notes (Signed)
  Subjective:    Patient ID: Lisa Golden, female    DOB: February 05, 1976, 36 y.o.   MRN: [id number]  HPI  six-month recheck for depression. She is on generic Prozac 20 mg daily and does well with that. She has Xanax on hand for anxiety but has not been taking that since about 2 weeks after she started the Prozac. Symptoms are well controlled on current regimen. No reports of any other problems or concerns today. She did have influenza immunization.    Review of Systems     Objective:   Physical Exam neck is supple without thyromegaly; chest clear to auscultation; cardiac exam regular rate and rhythm        Assessment & Plan:  Depression--- well controlled on Prozac 20 mg daily  Plan return in one year for physical exam without GYN exam. Okay to refill Prozac through mail order pharmacy for one year.

## 2011-09-14 ENCOUNTER — Encounter: Payer: Self-pay | Admitting: Obstetrics and Gynecology

## 2011-09-14 ENCOUNTER — Ambulatory Visit (INDEPENDENT_AMBULATORY_CARE_PROVIDER_SITE_OTHER): Payer: Private Health Insurance - Indemnity | Admitting: Obstetrics and Gynecology

## 2011-09-14 VITALS — BP 98/70 | Ht 68.0 in | Wt 169.0 lb

## 2011-09-14 DIAGNOSIS — Z01419 Encounter for gynecological examination (general) (routine) without abnormal findings: Secondary | ICD-10-CM

## 2011-09-14 NOTE — Progress Notes (Signed)
Subjective:    Lisa Golden is a 36 y.o. female, No obstetric history on file., who presents for an annual exam.  Son had medical issue this year, lost most of vision in right eye, but now recovered--was demylinization issue s/p viral illnes. Patient started on Prozac due to anxiety, but doing well.  Not using contraception at present, husband on Clomid for low testosterone.   They would be "OK" if pregnancy occurred.    History   Social History  . Marital Status: Single    Spouse Name: N/A    Number of Children: N/A  . Years of Education: N/A   Social History Main Topics  . Smoking status: Never Smoker   . Smokeless tobacco: Never Used  . Alcohol Use: No  . Drug Use: No  . Sexually Active: Yes    Birth Control/ Protection: None   Other Topics Concern  . None   Social History Narrative  . None    Menstrual cycle:   LMP: Patient's last menstrual period was 09/04/2011.           Cycle: Regular, monthly with normal flow and no severe dysmenorrha  The following portions of the patient's history were reviewed and updated as appropriate: allergies, current medications, past family history, past medical history, past social history, past surgical history and problem list.  Review of Systems Pertinent items are noted in HPI. Breast:Negative for breast lump,nipple discharge or nipple retraction Gastrointestinal: Negative for abdominal pain, change in bowel habits or rectal bleeding Urinary:negative   Objective:    BP 98/70  Ht 5\' 8"  (1.727 m)  Wt 169 lb (76.658 kg)  BMI 25.70 kg/m2  LMP 09/04/2011    Weight:  Wt Readings from Last 1 Encounters:  09/14/11 169 lb (76.658 kg)          BMI: Body mass index is 25.70 kg/(m^2).  General Appearance: Alert, appropriate appearance for age. No acute distress HEENT: Grossly normal Neck / Thyroid: Supple, no masses, nodes or enlargement Lungs: clear to auscultation bilaterally Back: No CVA tenderness Breast Exam: No dimpling,  nipple retraction or discharge. No masses or nodes. and No masses or nodes.No dimpling, nipple retraction or discharge. Cardiovascular: Regular rate and rhythm. S1, S2, no murmur Gastrointestinal: Soft, non-tender, no masses or organomegaly Pelvic Exam: Vulva and vagina appear normal. Bimanual exam reveals normal uterus and adnexa. Rectovaginal: not indicated and normal rectal, no masses Lymphatic Exam: Non-palpable nodes in neck, clavicular, axillary, or inguinal regions Skin: no rash or abnormalities Neurologic: Normal gait and speech, no tremor  Psychiatric: Alert and oriented, appropriate affect.   Wet Prep:not applicable Urinalysis:not applicable UPT: Not done   Assessment:    Normal gyn exam    Plan:    Mammogram at age 36 pap smear today return annually or prn STD screening: declined Contraception:no method      Trashaun Streight CNM, [place]

## 2011-09-14 NOTE — Progress Notes (Signed)
.  Last Pap:03/15/2010 WNL: Yes Regular Periods:yes Contraception: none  Monthly Breast exam:no Tetanus<82yrs:no Nl.Bladder Function:yes Daily BMs:yes Healthy Diet:yes Calcium:no Mammogram:no Exercise:yes Seatbelt: yes Abuse at home: yes Stressful work:no Sigmoid-colonoscopy: none Bone Density: No

## 2011-09-18 LAB — PAP IG W/ RFLX HPV ASCU

## 2011-10-23 ENCOUNTER — Other Ambulatory Visit: Payer: Self-pay | Admitting: Internal Medicine

## 2011-10-23 NOTE — Telephone Encounter (Signed)
Has pt been seen in the past year? May need appt for this only.

## 2012-04-24 ENCOUNTER — Telehealth: Payer: Self-pay | Admitting: Internal Medicine

## 2012-04-24 NOTE — Telephone Encounter (Signed)
Pt stated that she needs a new prescription for the generic Xanax. She had an old prescription from last year but never got it filled for the medication. Her next appt. will be 06-13-12 for her physical and she needs it filled before then because she will not have enough to last until then.

## 2012-04-25 ENCOUNTER — Other Ambulatory Visit: Payer: Self-pay

## 2012-04-25 MED ORDER — ALPRAZOLAM 0.5 MG PO TABS: 0.5000 mg | ORAL_TABLET | Freq: Two times a day (BID) | ORAL | Status: DC | PRN

## 2012-04-25 NOTE — Telephone Encounter (Signed)
Call in 30 days of Xanax please

## 2012-05-28 ENCOUNTER — Other Ambulatory Visit: Payer: Self-pay | Admitting: Internal Medicine

## 2012-06-11 ENCOUNTER — Other Ambulatory Visit: Payer: Managed Care, Other (non HMO) | Admitting: Internal Medicine

## 2012-06-11 DIAGNOSIS — Z0000 Encounter for general adult medical examination without abnormal findings: Secondary | ICD-10-CM

## 2012-06-11 LAB — CBC WITH DIFFERENTIAL/PLATELET
Eosinophils Absolute: 0 10*3/uL (ref 0.0–0.7)
Eosinophils Relative: 1 % (ref 0–5)
HCT: 38 % (ref 36.0–46.0)
Lymphocytes Relative: 32 % (ref 12–46)
Lymphs Abs: 1.8 10*3/uL (ref 0.7–4.0)
MCH: 30.9 pg (ref 26.0–34.0)
MCV: 91.8 fL (ref 78.0–100.0)
Monocytes Absolute: 0.6 10*3/uL (ref 0.1–1.0)
RBC: 4.14 MIL/uL (ref 3.87–5.11)
RDW: 13.2 % (ref 11.5–15.5)
WBC: 5.5 10*3/uL (ref 4.0–10.5)

## 2012-06-11 LAB — COMPREHENSIVE METABOLIC PANEL
BUN: 12 mg/dL (ref 6–23)
CO2: 27 mEq/L (ref 19–32)
Calcium: 9.3 mg/dL (ref 8.4–10.5)
Chloride: 106 mEq/L (ref 96–112)
Creat: 0.59 mg/dL (ref 0.50–1.10)
Glucose, Bld: 80 mg/dL (ref 70–99)
Total Bilirubin: 0.9 mg/dL (ref 0.3–1.2)

## 2012-06-11 LAB — LIPID PANEL
Cholesterol: 178 mg/dL (ref 0–200)
HDL: 57 mg/dL (ref >39)
Total CHOL/HDL Ratio: 3.1 Ratio
Triglycerides: 62 mg/dL (ref <150)

## 2012-06-12 LAB — VITAMIN D 25 HYDROXY (VIT D DEFICIENCY, FRACTURES): Vit D, 25-Hydroxy: 26 ng/mL — ABNORMAL LOW (ref 30–89)

## 2012-06-13 ENCOUNTER — Encounter: Payer: Self-pay | Admitting: Internal Medicine

## 2012-06-13 ENCOUNTER — Ambulatory Visit (INDEPENDENT_AMBULATORY_CARE_PROVIDER_SITE_OTHER): Payer: BC Managed Care – PPO | Admitting: Internal Medicine

## 2012-06-13 VITALS — BP 108/74 | HR 76 | Temp 99.0°F | Ht 67.85 in | Wt 164.0 lb

## 2012-06-13 DIAGNOSIS — F419 Anxiety disorder, unspecified: Secondary | ICD-10-CM

## 2012-06-13 DIAGNOSIS — F411 Generalized anxiety disorder: Secondary | ICD-10-CM

## 2012-06-13 DIAGNOSIS — Z0000 Encounter for general adult medical examination without abnormal findings: Secondary | ICD-10-CM

## 2012-06-13 LAB — POCT URINALYSIS DIPSTICK
Bilirubin, UA: NEGATIVE
Glucose, UA: NEGATIVE
Ketones, UA: NEGATIVE
Leukocytes, UA: NEGATIVE
Nitrite, UA: NEGATIVE

## 2012-06-13 MED ORDER — FLUOXETINE HCL 20 MG PO CAPS: 20.0000 mg | ORAL_CAPSULE | Freq: Every day | ORAL | Status: DC

## 2012-06-13 MED ORDER — ALPRAZOLAM 0.5 MG PO TABS: 0.5000 mg | ORAL_TABLET | Freq: Two times a day (BID) | ORAL | Status: DC

## 2012-07-06 ENCOUNTER — Encounter: Payer: Self-pay | Admitting: Internal Medicine

## 2012-07-06 NOTE — Progress Notes (Signed)
Subjective:    Patient ID: Lisa Golden, female    DOB: 04/09/1976, 37 y.o.   MRN: [id number]  HPI 37 year old white female in today for health maintenance exam. Has history of anxiety depression and and a treated with Prozac and Xanax. Patient currently is married but separated. Has one son. After the birth of her son she developed postpartum depression. Son was diagnosed with possible demyelinating illness because of sudden loss of vision in his left eye. Patient says that husband had fertility issues and it was hard for her to conceive. She was seen here in 2012 after a three-year absence. At that time Prozac and Xanax were refilled. We saw her again in February 2013. She remained on Prozac and Xanax. Xanax was just being used on when necessary. Allergies he makes her this. Fractured right forearm 1980 has had urinary tract infections 1995 and 1998. No history of seizures illnesses accidents or operations. His been a patient in this practice since 1999. Does not smoke or consume alcohol. One brother. Both parents and brother in good health. She works for [employer].  Despite the separation she and her husband are getting along well. This separation was his idea. She's not sure quite what prompted it but she's comfortable right now with it and they are sharing custody of their son. It was a surprise to her.    Review of Systems  Constitutional: Negative.   HENT: Negative.   Eyes: Negative.   Respiratory: Negative.   Cardiovascular: Negative.   Gastrointestinal: Negative.   Endocrine: Negative.   Genitourinary: Negative.   Allergic/Immunologic: Negative.   Neurological: Negative.   Hematological: Negative.   Psychiatric/Behavioral:       Some anxiety and insomnia       Objective:   Physical Exam  Vitals reviewed. Constitutional: She is oriented to person, place, and time. She appears well-developed and well-nourished. No distress.  HENT:  Head: Normocephalic and  atraumatic.  Right Ear: External ear normal.  Left Ear: External ear normal.  Nose: Nose normal.  Mouth/Throat: Oropharynx is clear and moist. No oropharyngeal exudate.  Eyes: Conjunctivae and EOM are normal. Pupils are equal, round, and reactive to light. Right eye exhibits no discharge. Left eye exhibits no discharge. No scleral icterus.  Neck: Neck supple. No JVD present. No thyromegaly present.  Cardiovascular: Normal rate, regular rhythm, normal heart sounds and intact distal pulses.   No murmur heard. Pulmonary/Chest: Effort normal and breath sounds normal. No respiratory distress. She has no wheezes. She has no rales. She exhibits no tenderness.  Breasts normal female  Abdominal: Soft. Bowel sounds are normal. She exhibits no distension and no mass. There is no tenderness. There is no rebound and no guarding.  Genitourinary:  Deferred  Musculoskeletal: Normal range of motion. She exhibits no edema.  Lymphadenopathy:    She has no cervical adenopathy.  Neurological: She is alert and oriented to person, place, and time. She has normal reflexes. No cranial nerve deficit. Coordination normal.  Skin: Skin is warm and dry. No rash noted. She is not diaphoretic.  Psychiatric: She has a normal mood and affect. Her behavior is normal. Judgment and thought content normal.          Assessment & Plan:  History  Anxiety  Depression  Plan: Okay to refill Xanax for one year. She's taking it on a when necessary basis    History of postpartum depression  Anxiety  Depression  Plan: Okay to refill Xanax for  one year. Return in one year or as needed. Recommend continuing Prozac since she is newly separated.

## 2012-07-06 NOTE — Patient Instructions (Addendum)
Continue Prozac daily. Take Xanax sparingly as needed. Return in one year.

## 2012-07-06 NOTE — Addendum Note (Signed)
Addended by: Margaree Mackintosh on: 07/06/2012 12:45 PM   Modules accepted: Level of Service

## 2012-08-11 ENCOUNTER — Other Ambulatory Visit: Payer: Self-pay | Admitting: Internal Medicine

## 2012-09-26 ENCOUNTER — Other Ambulatory Visit: Payer: Self-pay | Admitting: Dermatology

## 2012-10-28 ENCOUNTER — Other Ambulatory Visit: Payer: Self-pay | Admitting: Internal Medicine

## 2012-10-28 NOTE — Telephone Encounter (Signed)
Seen Feb 2014. OK to refill Xanax x 6 months

## 2012-11-20 ENCOUNTER — Ambulatory Visit (INDEPENDENT_AMBULATORY_CARE_PROVIDER_SITE_OTHER): Payer: BC Managed Care – PPO | Admitting: Family Medicine

## 2012-11-20 VITALS — BP 108/78 | HR 80 | Temp 97.8°F | Resp 18 | Ht 68.0 in | Wt 166.0 lb

## 2012-11-20 DIAGNOSIS — H02849 Edema of unspecified eye, unspecified eyelid: Secondary | ICD-10-CM

## 2012-11-20 DIAGNOSIS — H109 Unspecified conjunctivitis: Secondary | ICD-10-CM

## 2012-11-20 DIAGNOSIS — H02843 Edema of right eye, unspecified eyelid: Secondary | ICD-10-CM

## 2012-11-20 MED ORDER — OFLOXACIN 0.3 % OP SOLN: 2.0000 [drp] | Freq: Four times a day (QID) | OPHTHALMIC | Status: DC

## 2012-11-20 NOTE — Patient Instructions (Addendum)
1.  Start Claritin or Zyrtec 10mg  one daily.   2. Apply cold compresses to eyelid for swelling.

## 2012-11-20 NOTE — Progress Notes (Signed)
[address]   [place], [place]  [address]   [phone]  Subjective:    Patient ID: Lisa Golden, female    DOB: 19-May-1975, 37 y.o.   MRN: [id number]  HPI This 37 y.o. female presents for evaluation of R eye redness and R upper lid swelling.  Yesterday, had R pink eye.  Applied eye drops for red eye drops; with application of drops, eye burned.  Washed face and rubbed over eye, really tender.  As night went on, everything worsened.  Eyelid swollen this morning; +drainage and eyes closed shut.  Son with drainage from eye for past several days.  No fever/chills/sweats.  No sore throat, rhinorrhea; near pain none.  No headache; no myalgias.  No cough.  No blurred vision; No foreign body sensation.  No pain today in eye.  Has not reapplied drops to eye today.  +Drainage yesterday from eye white.  No photosensitivity.  Swelling has improved this morning.  No other medications other than Aleve.  No contacts or glasses.  No pain with eye movement.  Review of Systems  Constitutional: Negative for fever, chills, diaphoresis and fatigue.  HENT: Negative for ear pain, congestion, sore throat, facial swelling, rhinorrhea, trouble swallowing, neck pain, neck stiffness, voice change and postnasal drip.   Eyes: Positive for discharge, redness and itching. Negative for photophobia, pain and visual disturbance.  Respiratory: Negative for cough and shortness of breath.   Gastrointestinal: Negative for nausea, vomiting and diarrhea.  Skin: Negative for rash.  Neurological: Positive for headaches. Negative for dizziness and light-headedness.    Past Medical History  Diagnosis Date  . Anxiety   . Depression     History reviewed. No pertinent past surgical history.  Prior to Admission medications   Medication Sig Start Date End Date Taking? Authorizing Provider  ALPRAZolam Prudy Feeler) 0.5 MG tablet TAKE 1 TABLET BY MOUTH TWICE A DAY 10/28/12  Yes Margaree Mackintosh, MD  FLUoxetine (PROZAC) 20 MG capsule Take 1  capsule (20 mg total) by mouth daily. 06/13/12  Yes Margaree Mackintosh, MD  ofloxacin (OCUFLOX) 0.3 % ophthalmic solution Place 2 drops into the right eye 4 (four) times daily. 11/20/12   Ethelda Chick, MD    No Known Allergies  History   Social History  . Marital Status: Single    Spouse Name: N/A    Number of Children: N/A  . Years of Education: N/A   Occupational History  . Not on file.   Social History Main Topics  . Smoking status: Never Smoker   . Smokeless tobacco: Never Used  . Alcohol Use: No  . Drug Use: No  . Sexually Active: Yes    Birth Control/ Protection: None   Other Topics Concern  . Not on file   Social History Narrative  . No narrative on file    History reviewed. No pertinent family history.     Objective:   Physical Exam  Nursing note and vitals reviewed. Constitutional: She is oriented to person, place, and time. She appears well-developed and well-nourished. No distress.  HENT:  Head: Normocephalic and atraumatic.  Right Ear: External ear normal.  Left Ear: External ear normal.  Nose: Nose normal.  Mouth/Throat: Oropharynx is clear and moist.  Eyes: EOM are normal. Pupils are equal, round, and reactive to light. Right eye exhibits no discharge, no exudate and no hordeolum. No foreign body present in the right eye. Left eye exhibits no discharge, no exudate and no hordeolum. No  foreign body present in the left eye. Right conjunctiva is injected. Right conjunctiva has no hemorrhage. Left conjunctiva is not injected. Left conjunctiva has no hemorrhage.  R conjunctiva diffusely injected. R upper lid diffusely swollen with mild erythema and minimal induration.  Neurological: She is alert and oriented to person, place, and time.  Skin: Skin is warm and dry. No rash noted. She is not diaphoretic.  Psychiatric: She has a normal mood and affect. Her behavior is normal.       Assessment & Plan:  Conjunctivitis - Plan: ofloxacin (OCUFLOX) 0.3 % ophthalmic  solution  Swelling of eyelid, right   1. R conjunctivitis:  New.  Ddx viral versus bacterial etiology; empirically treat with Ocuflox drops.  If upper lid becomes painful, return to clinic to rule out secondary lid cellulitis.  RTC for photophobia, eye pain, vision changes. 2.  R upper lid swelling:  New. Ddx includes inflammatory response to acute infection versus allergic reaction versus early cellulitis.  Recommend cold compresses bid; Claritin or Zyrtec 10mg  daily; Benadryl qhs.  RTC for worsening.  Meds ordered this encounter  Medications  . ofloxacin (OCUFLOX) 0.3 % ophthalmic solution    Sig: Place 2 drops into the right eye 4 (four) times daily.    Dispense:  10 mL    Refill:  0

## 2012-11-28 ENCOUNTER — Ambulatory Visit (INDEPENDENT_AMBULATORY_CARE_PROVIDER_SITE_OTHER): Payer: BC Managed Care – PPO | Admitting: Internal Medicine

## 2012-11-28 ENCOUNTER — Encounter: Payer: Self-pay | Admitting: Internal Medicine

## 2012-11-28 VITALS — BP 114/82 | HR 92 | Temp 99.3°F | Wt 171.0 lb

## 2012-11-28 DIAGNOSIS — G4700 Insomnia, unspecified: Secondary | ICD-10-CM

## 2012-11-28 DIAGNOSIS — F439 Reaction to severe stress, unspecified: Secondary | ICD-10-CM

## 2012-11-28 DIAGNOSIS — J069 Acute upper respiratory infection, unspecified: Secondary | ICD-10-CM

## 2012-11-28 DIAGNOSIS — J029 Acute pharyngitis, unspecified: Secondary | ICD-10-CM

## 2012-11-28 DIAGNOSIS — Z733 Stress, not elsewhere classified: Secondary | ICD-10-CM

## 2012-11-28 LAB — POCT RAPID STREP A (OFFICE): Rapid Strep A Screen: NEGATIVE

## 2012-11-28 MED ORDER — BENZONATATE 100 MG PO CAPS: ORAL_CAPSULE | ORAL | Status: DC

## 2012-11-28 MED ORDER — LEVOFLOXACIN 500 MG PO TABS: 500.0000 mg | ORAL_TABLET | Freq: Every day | ORAL | Status: DC

## 2012-11-28 NOTE — Progress Notes (Signed)
  Subjective:    Patient ID: Lisa Golden, female    DOB: 12-02-1975, 37 y.o.   MRN: [id number]  HPI  37 year old White Female with URI onset Friday July 18th with sore throat. Had conjunctivitis the previous Wednesday July 16  seen at [facility]  and  Ofloxacin opthalmic drops. Conjunctivitis improved. Patient now has no sore throat but some cough at night and am  with discolored sputum in am. No fever or shaking chills. Has situational stress with separation. Having to take Xanax hs to sleep. If does not take Xanax, then cannot sleep. Pt taking  1/2 of a 0.5 mg tablet hs. May need to take on alternate nights and try to wean off. Says stress is some better.    Review of Systems     Objective:   Physical Exam HEENT:  TMs clear. Pharynx slightly injected . Rapid strep screen is negative.  Neck is supple. No adenopathy. Affect calm. Skin warm, and dry. Conjunctivae clear.        Assessment & Plan:  Insomnia Acute URI Conjunctivitis- resolved Situational Stress  Plan: Levaquin 500 mg daily x 7days. Tessalon Perles 100 mg ( #60)  2 po tid prn cough. Try to wean off Xanax hs and just take sparingly prn.

## 2012-11-28 NOTE — Patient Instructions (Addendum)
Take Levaquin daily for 7 days and take Tessalon perles as needed for cough

## 2013-05-10 ENCOUNTER — Other Ambulatory Visit: Payer: Self-pay | Admitting: Internal Medicine

## 2013-06-16 ENCOUNTER — Other Ambulatory Visit: Payer: BC Managed Care – PPO | Admitting: Internal Medicine

## 2013-06-16 DIAGNOSIS — Z13228 Encounter for screening for other metabolic disorders: Secondary | ICD-10-CM

## 2013-06-16 DIAGNOSIS — Z0000 Encounter for general adult medical examination without abnormal findings: Secondary | ICD-10-CM

## 2013-06-16 DIAGNOSIS — Z13220 Encounter for screening for lipoid disorders: Secondary | ICD-10-CM

## 2013-06-16 DIAGNOSIS — Z1329 Encounter for screening for other suspected endocrine disorder: Secondary | ICD-10-CM

## 2013-06-16 DIAGNOSIS — Z130 Encounter for screening for diseases of the blood and blood-forming organs and certain disorders involving the immune mechanism: Secondary | ICD-10-CM

## 2013-06-16 LAB — LIPID PANEL
CHOL/HDL RATIO: 3.2 ratio
Cholesterol: 196 mg/dL (ref 0–200)
HDL: 61 mg/dL (ref >39)
LDL Cholesterol: 120 mg/dL — ABNORMAL HIGH (ref 0–99)
Triglycerides: 76 mg/dL (ref <150)
VLDL: 15 mg/dL (ref 0–40)

## 2013-06-16 LAB — CBC WITH DIFFERENTIAL/PLATELET
Basophils Absolute: 0 10*3/uL (ref 0.0–0.1)
Basophils Relative: 0 % (ref 0–1)
EOS PCT: 1 % (ref 0–5)
Eosinophils Absolute: 0 10*3/uL (ref 0.0–0.7)
HEMATOCRIT: 38.7 % (ref 36.0–46.0)
Hemoglobin: 13 g/dL (ref 12.0–15.0)
LYMPHS ABS: 1.7 10*3/uL (ref 0.7–4.0)
Lymphocytes Relative: 28 % (ref 12–46)
MCH: 30.5 pg (ref 26.0–34.0)
MCHC: 33.6 g/dL (ref 30.0–36.0)
MCV: 90.8 fL (ref 78.0–100.0)
MONO ABS: 0.7 10*3/uL (ref 0.1–1.0)
Monocytes Relative: 12 % (ref 3–12)
Neutro Abs: 3.5 10*3/uL (ref 1.7–7.7)
Neutrophils Relative %: 59 % (ref 43–77)
Platelets: 313 10*3/uL (ref 150–400)
RBC: 4.26 MIL/uL (ref 3.87–5.11)
RDW: 13.5 % (ref 11.5–15.5)
WBC: 6 10*3/uL (ref 4.0–10.5)

## 2013-06-16 LAB — COMPREHENSIVE METABOLIC PANEL
ALT: 15 U/L (ref 0–35)
AST: 18 U/L (ref 0–37)
Albumin: 4.5 g/dL (ref 3.5–5.2)
Alkaline Phosphatase: 43 U/L (ref 39–117)
BUN: 11 mg/dL (ref 6–23)
CALCIUM: 9.2 mg/dL (ref 8.4–10.5)
CHLORIDE: 105 meq/L (ref 96–112)
CO2: 26 meq/L (ref 19–32)
CREATININE: 0.66 mg/dL (ref 0.50–1.10)
GLUCOSE: 86 mg/dL (ref 70–99)
Potassium: 4.3 mEq/L (ref 3.5–5.3)
Sodium: 138 mEq/L (ref 135–145)
Total Bilirubin: 0.5 mg/dL (ref 0.2–1.2)
Total Protein: 6.9 g/dL (ref 6.0–8.3)

## 2013-06-16 LAB — TSH: TSH: 1.174 u[IU]/mL (ref 0.350–4.500)

## 2013-06-17 ENCOUNTER — Encounter: Payer: Self-pay | Admitting: Internal Medicine

## 2013-06-17 ENCOUNTER — Ambulatory Visit (INDEPENDENT_AMBULATORY_CARE_PROVIDER_SITE_OTHER): Payer: BC Managed Care – PPO | Admitting: Internal Medicine

## 2013-06-17 VITALS — BP 112/76 | HR 76 | Temp 99.3°F | Ht 67.5 in | Wt 174.0 lb

## 2013-06-17 DIAGNOSIS — R82998 Other abnormal findings in urine: Secondary | ICD-10-CM

## 2013-06-17 DIAGNOSIS — M6740 Ganglion, unspecified site: Secondary | ICD-10-CM

## 2013-06-17 DIAGNOSIS — F411 Generalized anxiety disorder: Secondary | ICD-10-CM

## 2013-06-17 DIAGNOSIS — F419 Anxiety disorder, unspecified: Secondary | ICD-10-CM

## 2013-06-17 DIAGNOSIS — Z0000 Encounter for general adult medical examination without abnormal findings: Secondary | ICD-10-CM

## 2013-06-17 DIAGNOSIS — Z23 Encounter for immunization: Secondary | ICD-10-CM

## 2013-06-17 DIAGNOSIS — M67439 Ganglion, unspecified wrist: Secondary | ICD-10-CM

## 2013-06-17 DIAGNOSIS — E7800 Pure hypercholesterolemia, unspecified: Secondary | ICD-10-CM

## 2013-06-17 LAB — POCT URINALYSIS DIPSTICK
BILIRUBIN UA: NEGATIVE
Blood, UA: NEGATIVE
Glucose, UA: NEGATIVE
KETONES UA: NEGATIVE
Nitrite, UA: NEGATIVE
PROTEIN UA: NEGATIVE
Spec Grav, UA: <=1.005
Urobilinogen, UA: NEGATIVE
pH, UA: 8

## 2013-06-17 LAB — VITAMIN D 25 HYDROXY (VIT D DEFICIENCY, FRACTURES): VIT D 25 HYDROXY: 52 ng/mL (ref 30–89)

## 2013-06-17 MED ORDER — TETANUS-DIPHTH-ACELL PERTUSSIS 5-2.5-18.5 LF-MCG/0.5 IM SUSP: 0.5000 mL | Freq: Once | INTRAMUSCULAR | Status: DC

## 2013-06-17 MED ORDER — ALPRAZOLAM 0.5 MG PO TABS: ORAL_TABLET | ORAL | Status: DC

## 2013-06-17 NOTE — Patient Instructions (Addendum)
Continue Prozac. Tetanus immunization update given. Xanax refill given. RTC  One year or as needed. Watch diet and exercise. Continue Prozac.

## 2013-06-17 NOTE — Progress Notes (Signed)
   Subjective:    Patient ID: Lisa Golden, female    DOB: [birth year], 38 y.o.   MRN: [id number]  HPI  pleasant 38 year old white female in today for health maintenance exam. History of anxiety depression treated with Prozac and Xanax. Has not had Xanax in some time. Is seeing counselor at [facility]. She is separated from her husband. She says he is addicted to pornography. They have one son. Husband is currently living with his mother. She says he is an excellent father. After the birth of her son, she developed postpartum depression. Her son was diagnosed with possible demyelinating illness because of a sudden loss of vision in his left eye at the time. She remains on generic Prozac.  Past medical history: Fracture right forearm 1980. Urinary tract infections 1995 and 1998. No history of serious illnesses accidents or operations.  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  Family history: Parents and brother in good health.  Social history: Does not smoke or consume alcohol. She works for [employer].    Review of Systems  Constitutional: Negative.   HENT: Negative.   Eyes: Negative.   Respiratory: Negative.   Cardiovascular: Negative.   Gastrointestinal: Negative.   Endocrine: Negative.   Musculoskeletal:       Nodule noted dorsal aspect right wrist  Allergic/Immunologic: Negative.   Neurological: Negative.   Hematological: Negative.   Psychiatric/Behavioral:       History of anxiety       Objective:   Physical Exam  Vitals reviewed. Constitutional: She is oriented to person, place, and time. She appears well-developed and well-nourished. No distress.  HENT:  Head: Normocephalic and atraumatic.  Right Ear: External ear normal.  Left Ear: External ear normal.  Mouth/Throat: No oropharyngeal exudate.  Eyes: Conjunctivae and EOM are normal. Pupils are equal, round, and reactive to light.  Neck: Neck supple. No JVD present. No  thyromegaly present.  Cardiovascular: Normal rate, regular rhythm and normal heart sounds.   No murmur heard. Pulmonary/Chest: Effort normal and breath sounds normal. She has no wheezes. She has no rales.  Breasts normal female  Abdominal: Soft. Bowel sounds are normal. She exhibits no distension and no mass. There is no tenderness. There is no rebound and no guarding.  Genitourinary:  Deferred to GYN Dr. Doran Stabler office.  Musculoskeletal: Normal range of motion. She exhibits no edema.  Ganglion cyst dorsal aspect right wrist. Not tender.  Lymphadenopathy:    She has no cervical adenopathy.  Neurological: She is alert and oriented to person, place, and time. She has normal reflexes. She displays normal reflexes. No cranial nerve deficit. Coordination normal.  Skin: Skin is warm and dry. No rash noted. She is not diaphoretic.  Psychiatric: She has a normal mood and affect. Her behavior is normal. Judgment and thought content normal.          Assessment & Plan:  History of anxiety-not taking Xanax at the present time. Remains on Prozac.  Situational stress-separation from husband and is a single parent  Ganglion cyst right wrist-asymptomatic continue to observe. This is a new finding.  Elevated LDL cholesterol  Plan: Return in one year or as needed. Her LDL cholesterol is elevated. Needs to watch diet and exercise.

## 2013-06-18 LAB — URINE CULTURE
Colony Count: NO GROWTH
Organism ID, Bacteria: NO GROWTH

## 2014-01-22 ENCOUNTER — Other Ambulatory Visit: Payer: Self-pay | Admitting: Dermatology

## 2014-01-30 ENCOUNTER — Other Ambulatory Visit: Payer: Self-pay | Admitting: Internal Medicine

## 2014-01-30 NOTE — Telephone Encounter (Signed)
Please refill medication 06/10/2014. Please call patient in physical exam after 06/17/2014. Must be seen yearly.

## 2014-02-16 ENCOUNTER — Encounter: Payer: Self-pay | Admitting: Internal Medicine

## 2014-02-16 ENCOUNTER — Ambulatory Visit (INDEPENDENT_AMBULATORY_CARE_PROVIDER_SITE_OTHER): Payer: BC Managed Care – PPO | Admitting: Internal Medicine

## 2014-02-16 VITALS — BP 110/78 | HR 100 | Temp 99.4°F | Ht 69.0 in | Wt 173.0 lb

## 2014-02-16 DIAGNOSIS — J069 Acute upper respiratory infection, unspecified: Secondary | ICD-10-CM

## 2014-02-16 MED ORDER — AZITHROMYCIN 250 MG PO TABS: ORAL_TABLET | ORAL | Status: DC

## 2014-02-16 NOTE — Progress Notes (Signed)
   Subjective:    Patient ID: Lisa Golden, female    DOB: 1975/10/12, 38 y.o.   MRN: [id number]  HPI  Onset fever and sore throat 2 days ago. Some headache. No chills. No ear pain. No significant cough.    Review of Systems     Objective:   Physical Exam TMs are clear. Pharynx only slightly injected. Neck supple without significant adenopathy. Chest clear to auscultation.       Assessment & Plan:  Acute URI  Plan: Zithromax 2 po day 1 then one po days 2-5. Tylenol for fever.

## 2014-02-16 NOTE — Patient Instructions (Signed)
Take Zithromax as directed. Take Tylenol as needed for fever

## 2014-07-09 ENCOUNTER — Ambulatory Visit (INDEPENDENT_AMBULATORY_CARE_PROVIDER_SITE_OTHER): Payer: BLUE CROSS/BLUE SHIELD | Admitting: Internal Medicine

## 2014-07-09 ENCOUNTER — Encounter: Payer: Self-pay | Admitting: Internal Medicine

## 2014-07-09 VITALS — BP 120/82 | HR 104 | Temp 98.7°F | Ht 69.0 in | Wt 179.0 lb

## 2014-07-09 DIAGNOSIS — Z8659 Personal history of other mental and behavioral disorders: Secondary | ICD-10-CM

## 2014-07-09 MED ORDER — FLUOXETINE HCL 10 MG PO CAPS: 10.0000 mg | ORAL_CAPSULE | Freq: Every day | ORAL | Status: DC

## 2014-07-10 NOTE — Patient Instructions (Signed)
Decreased Prozac from 20-10 mg daily for 4 weeks and then to twice weekly for 4 weeks and then try to discontinue it.

## 2014-07-10 NOTE — Progress Notes (Signed)
   Subjective:    Patient ID: Lisa Golden, female    DOB: 02/18/1976, 39 y.o.   MRN: [id number]  HPI  She was seen here in 2012 after a three-year absence and was under a lot of stress. Was started back on Prozac and Xanax. Has been maintained on this medication since 2012 on a regular basis. She did well on those medications. She had a son who was diagnosed with a chronic illness. She has a history of postpartum depression. She's been through a separation. She has been seen by counselor, Pervis Hocking. Says the counselor think she is doing well and doesn't need counseling at the present time. She would like to come off of Prozac and would like to know how to taper it.    Review of Systems     Objective:   Physical Exam  Spent 15 minutes speaking with patient about how to taper Prozac. It has a long half-life. I think she can safely go to 10 mg daily for about a month and then twice weekly for another month and then discontinue it.      Assessment & Plan:  Anxiety  History of depression  History of postpartum depression  Plan: Taper Prozac as above. Call if any questions. She is aware that there may be a possibility that depression can recur and she may need to take Prozac once again in the future

## 2014-07-28 ENCOUNTER — Ambulatory Visit (INDEPENDENT_AMBULATORY_CARE_PROVIDER_SITE_OTHER): Payer: BLUE CROSS/BLUE SHIELD | Admitting: Internal Medicine

## 2014-07-28 ENCOUNTER — Encounter: Payer: Self-pay | Admitting: Internal Medicine

## 2014-07-28 VITALS — BP 128/70 | HR 79 | Temp 98.4°F | Wt 178.0 lb

## 2014-07-28 DIAGNOSIS — M25552 Pain in left hip: Secondary | ICD-10-CM

## 2014-07-28 MED ORDER — MELOXICAM 15 MG PO TABS: 15.0000 mg | ORAL_TABLET | Freq: Every day | ORAL | Status: DC

## 2014-07-28 NOTE — Patient Instructions (Signed)
Take meloxicam 15 mg daily for 30 days. Apply ice to hip 20 minutes daily. Cut back on running for 7-10 days. Call if not better in 2 weeks.

## 2014-07-28 NOTE — Progress Notes (Signed)
   Subjective:    Patient ID: Lisa Golden, female    DOB: [birth year], 39 y.o.   MRN: [id number]  HPI  Patient recently started running with her son for exercise. Says she was not able to run very far without getting short of breath. However for 2 weeks has been experiencing    some pain in her left hip area.    Review of Systems     Objective:   Physical Exam  She is tender over left hip anterior superior iliac spine. No pain with internal or external rotation of left hip. Abdomen is benign.     Assessment & Plan:  Left hip pain-suspect she strained her hip with running  Plan: Meloxicam 15 mg daily #30 tablets with no refill. Apply ice to left hip area for 20 minutes daily. Call if not better in 2 weeks.

## 2014-08-10 ENCOUNTER — Encounter: Payer: Self-pay | Admitting: Internal Medicine

## 2014-08-10 ENCOUNTER — Other Ambulatory Visit: Payer: Self-pay | Admitting: Internal Medicine

## 2014-08-10 ENCOUNTER — Ambulatory Visit
Admission: RE | Admit: 2014-08-10 | Discharge: 2014-08-10 | Disposition: A | Discharge status: Home / Self Care | Payer: BLUE CROSS/BLUE SHIELD | Source: Ambulatory Visit | Attending: Internal Medicine | Admitting: Internal Medicine

## 2014-08-10 ENCOUNTER — Ambulatory Visit (INDEPENDENT_AMBULATORY_CARE_PROVIDER_SITE_OTHER): Payer: BLUE CROSS/BLUE SHIELD | Admitting: Internal Medicine

## 2014-08-10 ENCOUNTER — Telehealth: Payer: Self-pay | Admitting: Internal Medicine

## 2014-08-10 VITALS — BP 122/72 | HR 84 | Temp 98.3°F | Wt 176.0 lb

## 2014-08-10 DIAGNOSIS — R1032 Left lower quadrant pain: Secondary | ICD-10-CM

## 2014-08-10 DIAGNOSIS — K5900 Constipation, unspecified: Secondary | ICD-10-CM | POA: Diagnosis not present

## 2014-08-10 DIAGNOSIS — R8290 Unspecified abnormal findings in urine: Secondary | ICD-10-CM | POA: Diagnosis not present

## 2014-08-10 DIAGNOSIS — R109 Unspecified abdominal pain: Secondary | ICD-10-CM

## 2014-08-10 LAB — POCT URINALYSIS DIPSTICK
BILIRUBIN UA: NEGATIVE
Blood, UA: NEGATIVE
GLUCOSE UA: NEGATIVE
Ketones, UA: NEGATIVE
NITRITE UA: NEGATIVE
Protein, UA: NEGATIVE
Spec Grav, UA: <=1.005
Urobilinogen, UA: NEGATIVE
pH, UA: 5

## 2014-08-10 LAB — CBC WITH DIFFERENTIAL/PLATELET
BASOS PCT: 0 % (ref 0–1)
Basophils Absolute: 0 10*3/uL (ref 0.0–0.1)
Eosinophils Absolute: 0 10*3/uL (ref 0.0–0.7)
Eosinophils Relative: 0 % (ref 0–5)
HEMATOCRIT: 39.3 % (ref 36.0–46.0)
Hemoglobin: 13.1 g/dL (ref 12.0–15.0)
LYMPHS PCT: 25 % (ref 12–46)
Lymphs Abs: 2.2 10*3/uL (ref 0.7–4.0)
MCH: 30.2 pg (ref 26.0–34.0)
MCHC: 33.3 g/dL (ref 30.0–36.0)
MCV: 90.6 fL (ref 78.0–100.0)
MPV: 11.3 fL (ref 8.6–12.4)
Monocytes Absolute: 0.6 10*3/uL (ref 0.1–1.0)
Monocytes Relative: 7 % (ref 3–12)
NEUTROS ABS: 6.1 10*3/uL (ref 1.7–7.7)
NEUTROS PCT: 68 % (ref 43–77)
Platelets: 314 10*3/uL (ref 150–400)
RBC: 4.34 MIL/uL (ref 3.87–5.11)
RDW: 13.3 % (ref 11.5–15.5)
WBC: 8.9 10*3/uL (ref 4.0–10.5)

## 2014-08-10 LAB — TSH: TSH: 1.584 u[IU]/mL (ref 0.350–4.500)

## 2014-08-10 LAB — COMPLETE METABOLIC PANEL WITH GFR
ALBUMIN: 4.5 g/dL (ref 3.5–5.2)
ALT: 10 U/L (ref 0–35)
AST: 14 U/L (ref 0–37)
Alkaline Phosphatase: 48 U/L (ref 39–117)
BUN: 10 mg/dL (ref 6–23)
CALCIUM: 9.9 mg/dL (ref 8.4–10.5)
CO2: 22 meq/L (ref 19–32)
Chloride: 103 mEq/L (ref 96–112)
Creat: 0.76 mg/dL (ref 0.50–1.10)
GFR, Est Non African American: >89 mL/min
Glucose, Bld: 146 mg/dL — ABNORMAL HIGH (ref 70–99)
Potassium: 4.6 mEq/L (ref 3.5–5.3)
Sodium: 137 mEq/L (ref 135–145)
Total Bilirubin: 0.7 mg/dL (ref 0.2–1.2)
Total Protein: 7.5 g/dL (ref 6.0–8.3)

## 2014-08-10 LAB — T4, FREE: Free T4: 1.2 ng/dL (ref 0.80–1.80)

## 2014-08-10 IMAGING — CR DG ABDOMEN 2V
2 series · 2 of 2 positions shown · non-contrast
Comparison: None.

CLINICAL DATA: Left flank pain for 2 weeks.

EXAM:
ABDOMEN - 2 VIEW

[view not recorded (1 of 2)]
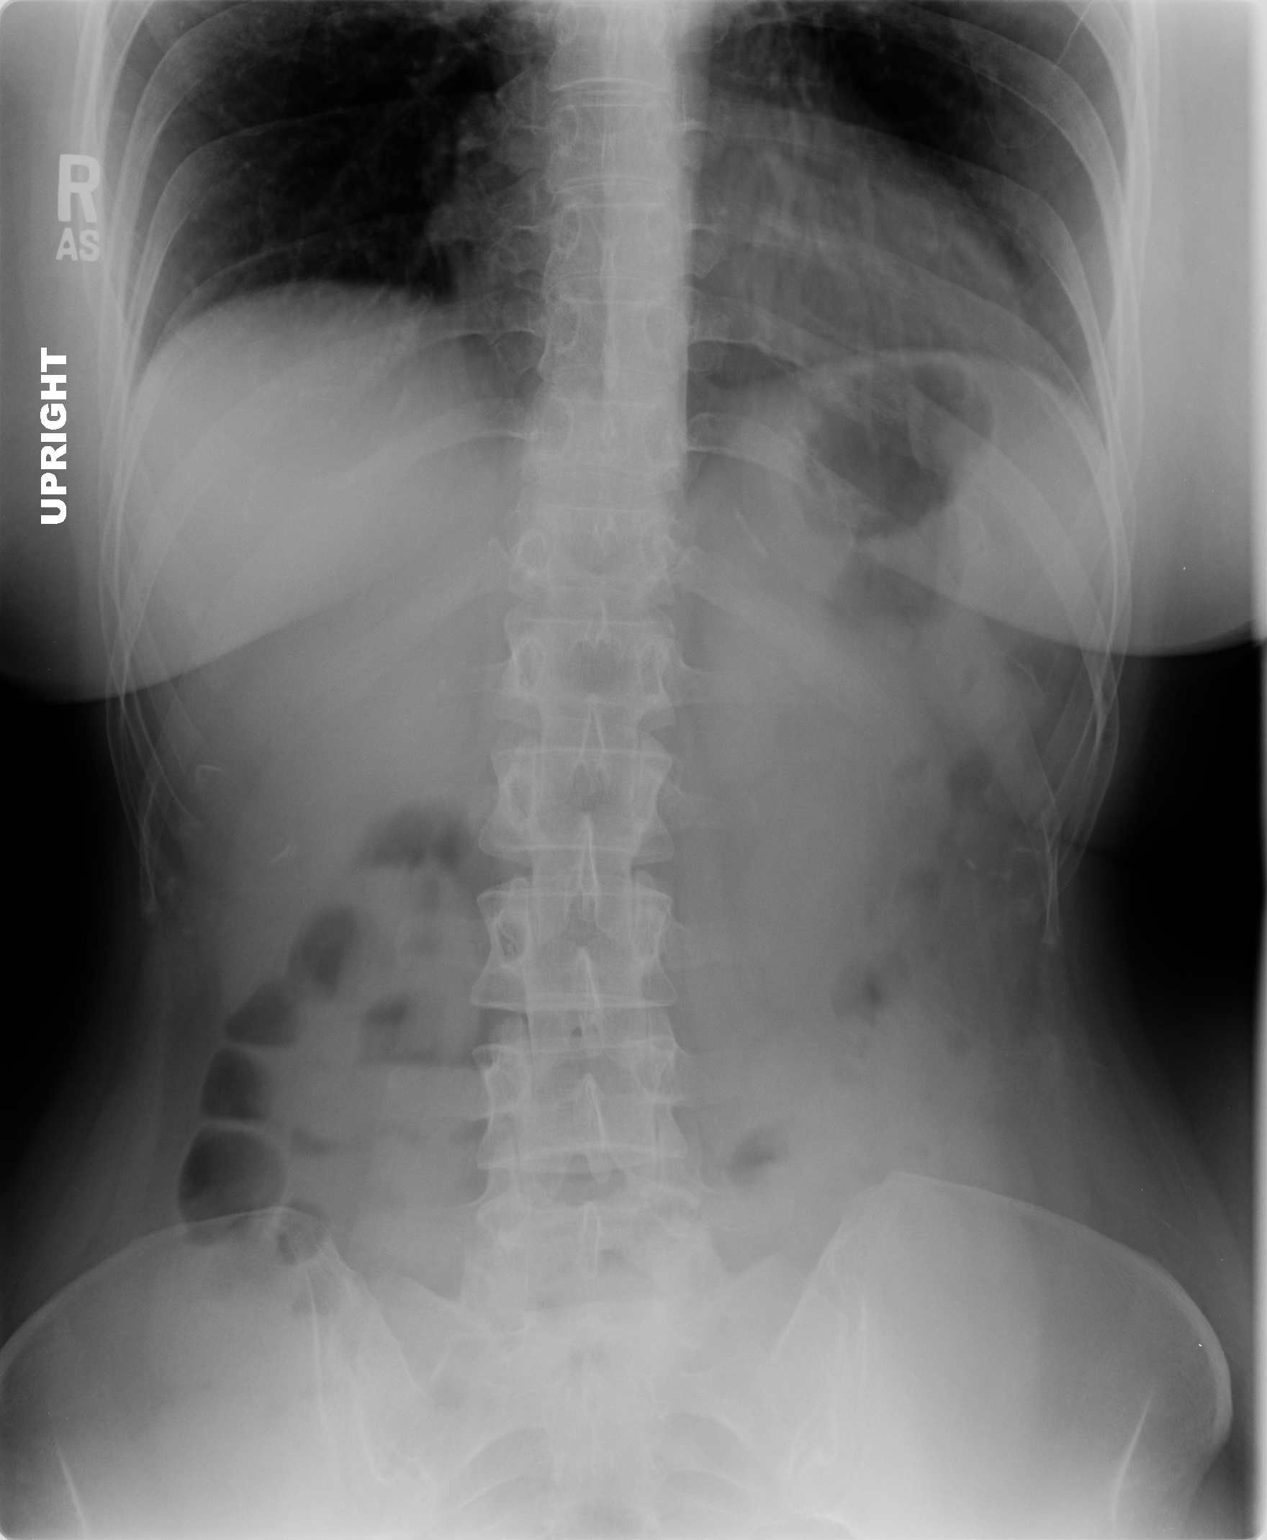

[view not recorded (2 of 2)]
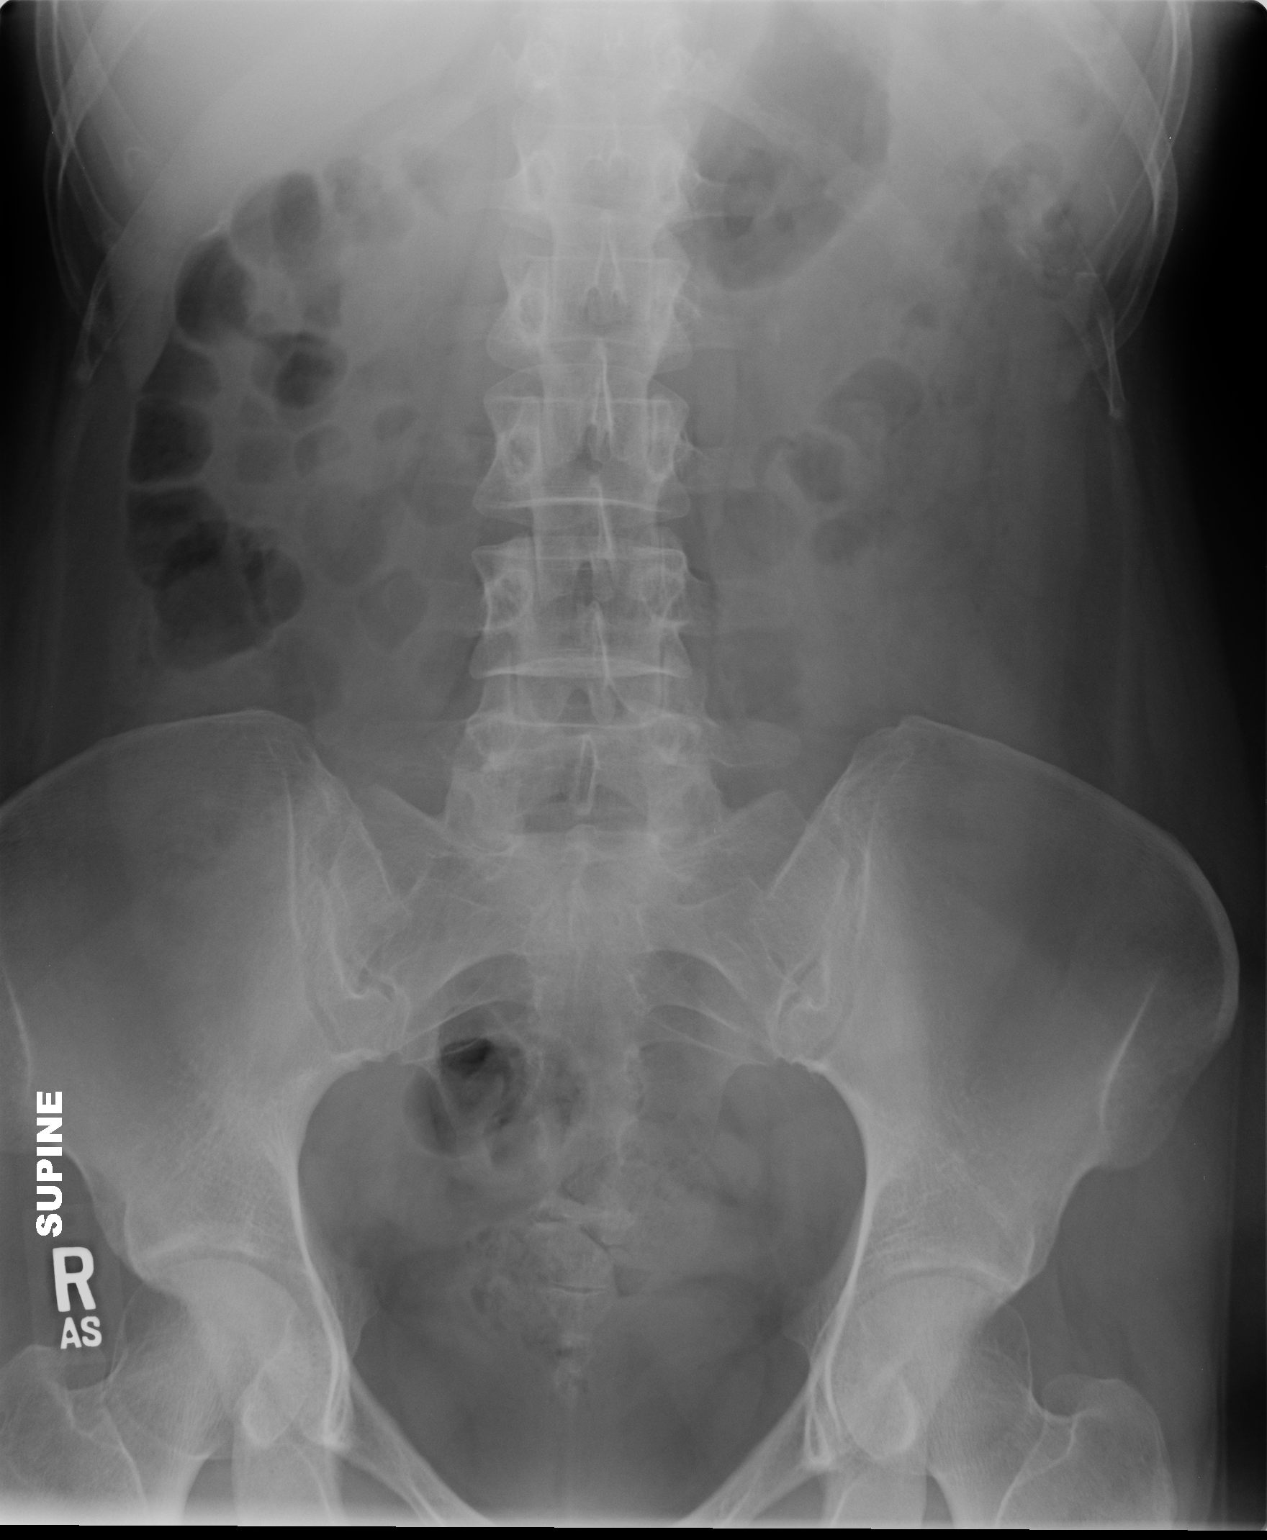

[2 of 2 positions shown; findings below may reference images not displayed]

FINDINGS: The bowel gas pattern is normal. There is no evidence of free air.
No radio-opaque calculi or other significant radiographic
abnormality is seen.
IMPRESSION: Negative.

## 2014-08-10 NOTE — Telephone Encounter (Signed)
Spoke with Dr. Renold Genta; verbal order to send patient for KUB flat and upright and see patient after x-ray this afternoon after x-ray.  Vinnie Level will put in order for the x-ray to be done at [facility].

## 2014-08-10 NOTE — Telephone Encounter (Signed)
Spoke with patient; advised to go for x-ray between now and 2pm.  Gave her appointment here at 2:45 today.  Patient verbalized understanding of these instructions.

## 2014-08-10 NOTE — Patient Instructions (Signed)
Appt with orthopedist regarding hip pain. Take 2 Dulcolax tablets tonight and repeat in 12 hours if no relief of constipation. Then start MiraLAX daily. Continue Mobic

## 2014-08-10 NOTE — Telephone Encounter (Signed)
States she doesn't feel the Meloxicam is providing any relief at all.  Has been using that and icing every night as instructed.  She feels this may be intestinal.   Says that it's at the top of the hip bone when mashed.  Very sore today and she hasn't had a BM today.  The consistency of BM's are not normal.  She seems to have some relief when she has a BM.  She now states the pain seems to move now too.  She feels maybe something muscle related.

## 2014-08-10 NOTE — Progress Notes (Signed)
   Subjective:    Patient ID: Lisa Golden, female    DOB: 1975-10-03, 39 y.o.   MRN: [id number]  HPI  Was seen here recently with what appeared to be pain over upper left hip in axillary line. Had been exercising recently. Now thinks this may have something to do with constipation. She does have a history of constipation. Says she feels some relief with pain after having a bowel movement. KUB today showed no evidence of constipation. Still tender over upper left hip axillary line area. Has been taking Mobic and applying ice to the area but doesn't see much improvement.  No blood in urine. No urinary symptoms. Urinalysis shows LE. Culture was sent. No blood in urine.     Review of Systems     Objective:   Physical Exam  Abdomen is soft nondistended without hepatosplenomegaly or masses.. Mild tenderness left lower abdomen without rebound. Tender over upper hip in axillary line. Straight leg raising is negative at 90 and muscle strength is normal.      Assessment & Plan:  Left hip pain-previously treated with Mobic  Constipation  Possible UTI-culture sent  Plan: Patient will see orthopedist regarding hip pain. We did draw labs today including TSH. She will take 2 Dulcolax tablets tonight and repeat in 12 hours if no relief of constipation. Then she should start MiraLAX daily. I considered whether or not to do a CT of the abdomen and pelvis but will hold off until she is evaluated by orthopedist.  While having blood drawn , patient had a vasovagal near syncopal episode. She recovered quickly.

## 2014-08-11 LAB — HEMOGLOBIN A1C
Hgb A1c MFr Bld: 5.4 % (ref <5.7)
Mean Plasma Glucose: 108 mg/dL (ref <117)

## 2014-08-12 ENCOUNTER — Telehealth: Payer: Self-pay | Admitting: Internal Medicine

## 2014-08-12 LAB — URINE CULTURE
Colony Count: NO GROWTH
Organism ID, Bacteria: NO GROWTH

## 2014-08-12 NOTE — Telephone Encounter (Signed)
LMOM for patient to call me.   Ortho appointment made with Dr. Joni Fears at [facility] [address] [place] [phone] Appointment:  4/22 @ 10:00  Faxed info over to office @ [phone] (on 4/6 @ 1430)

## 2015-04-20 ENCOUNTER — Ambulatory Visit (INDEPENDENT_AMBULATORY_CARE_PROVIDER_SITE_OTHER): Payer: BLUE CROSS/BLUE SHIELD | Admitting: Internal Medicine

## 2015-04-20 ENCOUNTER — Encounter: Payer: Self-pay | Admitting: Internal Medicine

## 2015-04-20 VITALS — BP 104/78 | HR 99 | Temp 98.1°F | Resp 20 | Ht 69.0 in | Wt 163.0 lb

## 2015-04-20 DIAGNOSIS — F411 Generalized anxiety disorder: Secondary | ICD-10-CM

## 2015-04-20 DIAGNOSIS — Z658 Other specified problems related to psychosocial circumstances: Secondary | ICD-10-CM | POA: Diagnosis not present

## 2015-04-20 DIAGNOSIS — F439 Reaction to severe stress, unspecified: Secondary | ICD-10-CM

## 2015-04-20 MED ORDER — FLUOXETINE HCL 20 MG PO CAPS: 20.0000 mg | ORAL_CAPSULE | Freq: Every day | ORAL | Status: DC

## 2015-04-20 MED ORDER — ALPRAZOLAM 0.5 MG PO TABS: ORAL_TABLET | ORAL | Status: DC

## 2015-04-20 NOTE — Patient Instructions (Addendum)
Restart Prozac 20 mg daily. Take Xanax twice daily as needed. Call if symptoms do not improve in 2-4 weeks or sooner get hoarse and talking on the.if worse

## 2015-04-20 NOTE — Progress Notes (Signed)
   Subjective:    Patient ID: Lisa Golden, female    DOB: [birth year], 39 y.o.   MRN: [id number]  HPI  5 years ago her son was diagnosed with acute disseminated encephalomyelitis which affected his optic nerve causing him to lose significant vision in his. This a very traumatic for her. However he's adjusted well and lives a normal life. Around that time she developed anxiety related to that and was treated with Xanax and Prozac. In March, she decided to wean herself off Prozac. She did well until recently when her son became ill at school. He had a syncopal episode which was very frightening and traumatic for her. He was seen at the hospital and was checked out. He also has mild hydronephrosis in his been evaluated at [facility] and that is being followed. All of this seemed to trigger some posttraumatic stress. It same time of year that he was initially diagnosed with his vision loss. She's felt very panicked and overwhelmed. She has Xanax on hand which she's been taking sparingly. However it doesn't seem to be enough at the present time.   She sleeping well with the Xanax. She is not crying. Her thoughts are rational. She is a single mother.    Review of Systems     Objective:   Physical Exam  Not examined periods but 20 minutes speaking with her about posttraumatic stress, panic disorder, anxiety      Assessment & Plan:  Anxiety  Situational stress  Plan: Restart Prozac 20 mg daily. Continue this a minimum of 6 months before considering tapering again. Refill Xanax 0.5 mg 1/2-1 by mouth twice daily #60 with 1 refill. She will call me if she does not improve. She has appointment see counselor tomorrow.

## 2015-06-28 ENCOUNTER — Telehealth: Payer: Self-pay | Admitting: Internal Medicine

## 2015-06-28 NOTE — Telephone Encounter (Signed)
Patient states she feels like she needs to be tested for the flu.  She aches all over, has fever of 101, headache, vomiting since 5:00 a.m. Today.  Advised patient she may want to consider Urgent Care or [facility] where she can have Rapid Flu done.  Patient will consider, but would like to be scheduled to be seen by you.  I don't have anything until late afternoon tomorrow on the schedule.    Please advise.

## 2015-06-28 NOTE — Telephone Encounter (Signed)
Called patient back; she was at the Urgent Care having a flu test done at the time of the call.  Was awaiting the results at the time of the call.

## 2015-06-28 NOTE — Telephone Encounter (Signed)
Needs to go to Urgent Care.  °

## 2015-09-28 ENCOUNTER — Other Ambulatory Visit: Payer: Self-pay | Admitting: Internal Medicine

## 2015-12-21 ENCOUNTER — Other Ambulatory Visit: Payer: Self-pay | Admitting: Internal Medicine

## 2016-04-12 ENCOUNTER — Other Ambulatory Visit: Payer: Self-pay | Admitting: Internal Medicine

## 2016-04-13 ENCOUNTER — Other Ambulatory Visit: Payer: Self-pay | Admitting: Internal Medicine

## 2016-04-13 MED ORDER — FLUOXETINE HCL 20 MG PO CAPS: 20.0000 mg | ORAL_CAPSULE | Freq: Every day | ORAL | 2 refills | Status: DC

## 2016-04-28 ENCOUNTER — Ambulatory Visit (INDEPENDENT_AMBULATORY_CARE_PROVIDER_SITE_OTHER): Payer: BLUE CROSS/BLUE SHIELD | Admitting: Internal Medicine

## 2016-04-28 ENCOUNTER — Encounter: Payer: Self-pay | Admitting: Internal Medicine

## 2016-04-28 VITALS — HR 88 | Temp 98.9°F | Resp 18 | Wt 171.0 lb

## 2016-04-28 DIAGNOSIS — J209 Acute bronchitis, unspecified: Secondary | ICD-10-CM

## 2016-04-28 MED ORDER — PSEUDOEPH-BROMPHEN-DM 30-2-10 MG/5ML PO SYRP: 5.0000 mL | ORAL_SOLUTION | Freq: Three times a day (TID) | ORAL | 0 refills | Status: DC | PRN

## 2016-04-28 MED ORDER — LEVOFLOXACIN 500 MG PO TABS: 500.0000 mg | ORAL_TABLET | Freq: Every day | ORAL | 0 refills | Status: DC

## 2016-04-28 NOTE — Progress Notes (Signed)
   Subjective:    Patient ID: Lisa Golden, female    DOB: [birth year], 40 y.o.   MRN: [id number]  HPI 40 year old Female with to an urgent care on December 5 for respiratory infection. Was treated with Zithromax Z-PAK but cough never resolved. Now producing discolored sputum with low-grade temperature. Chest is sore from coughing.    Review of Systems     Objective:   Physical Exam TMs are clear. Pharynx is clear. Neck is supple without significant adenopathy. Chest clear to auscultation.       Assessment & Plan:  Acute bronchitis  Plan: Levaquin 500 milligrams daily for 10 days. Rest and drink plenty of fluids.

## 2016-05-03 NOTE — Patient Instructions (Signed)
Levaquin 500 milligrams daily for 10 days. Rest and drink plenty of fluids.

## 2016-06-02 ENCOUNTER — Other Ambulatory Visit: Payer: BLUE CROSS/BLUE SHIELD | Admitting: Internal Medicine

## 2016-06-02 DIAGNOSIS — Z1329 Encounter for screening for other suspected endocrine disorder: Secondary | ICD-10-CM

## 2016-06-02 DIAGNOSIS — Z13220 Encounter for screening for lipoid disorders: Secondary | ICD-10-CM

## 2016-06-02 DIAGNOSIS — Z0000 Encounter for general adult medical examination without abnormal findings: Secondary | ICD-10-CM

## 2016-06-02 DIAGNOSIS — Z1321 Encounter for screening for nutritional disorder: Secondary | ICD-10-CM

## 2016-06-02 LAB — CBC WITH DIFFERENTIAL/PLATELET
BASOS ABS: 57 {cells}/uL (ref 0–200)
BASOS PCT: 1 %
EOS PCT: 1 %
Eosinophils Absolute: 57 cells/uL (ref 15–500)
HCT: 39.3 % (ref 35.0–45.0)
Hemoglobin: 12.9 g/dL (ref 11.7–15.5)
LYMPHS ABS: 1425 {cells}/uL (ref 850–3900)
LYMPHS PCT: 25 %
MCH: 30.5 pg (ref 27.0–33.0)
MCHC: 32.8 g/dL (ref 32.0–36.0)
MCV: 92.9 fL (ref 80.0–100.0)
MONOS PCT: 11 %
MPV: 10.5 fL (ref 7.5–12.5)
Monocytes Absolute: 627 cells/uL (ref 200–950)
NEUTROS ABS: 3534 {cells}/uL (ref 1500–7800)
Neutrophils Relative %: 62 %
PLATELETS: 292 10*3/uL (ref 140–400)
RBC: 4.23 MIL/uL (ref 3.80–5.10)
RDW: 13.5 % (ref 11.0–15.0)
WBC: 5.7 10*3/uL (ref 3.8–10.8)

## 2016-06-02 LAB — TSH: TSH: 1.51 m[IU]/L

## 2016-06-03 LAB — LIPID PANEL
CHOL/HDL RATIO: 3 ratio (ref <5.0)
Cholesterol: 198 mg/dL (ref <200)
HDL: 67 mg/dL (ref >50)
LDL Cholesterol: 118 mg/dL — ABNORMAL HIGH (ref <100)
Triglycerides: 64 mg/dL (ref <150)
VLDL: 13 mg/dL (ref <30)

## 2016-06-03 LAB — COMPREHENSIVE METABOLIC PANEL
ALT: 8 U/L (ref 6–29)
AST: 15 U/L (ref 10–30)
Albumin: 4 g/dL (ref 3.6–5.1)
Alkaline Phosphatase: 34 U/L (ref 33–115)
BUN: 10 mg/dL (ref 7–25)
CHLORIDE: 105 mmol/L (ref 98–110)
CO2: 25 mmol/L (ref 20–31)
Calcium: 9 mg/dL (ref 8.6–10.2)
Creat: 0.68 mg/dL (ref 0.50–1.10)
GLUCOSE: 83 mg/dL (ref 65–99)
POTASSIUM: 4.2 mmol/L (ref 3.5–5.3)
SODIUM: 136 mmol/L (ref 135–146)
Total Bilirubin: 0.6 mg/dL (ref 0.2–1.2)
Total Protein: 6.9 g/dL (ref 6.1–8.1)

## 2016-06-03 LAB — VITAMIN D 25 HYDROXY (VIT D DEFICIENCY, FRACTURES): VIT D 25 HYDROXY: 51 ng/mL (ref 30–100)

## 2016-06-06 ENCOUNTER — Encounter: Payer: Self-pay | Admitting: Internal Medicine

## 2016-06-06 ENCOUNTER — Ambulatory Visit (INDEPENDENT_AMBULATORY_CARE_PROVIDER_SITE_OTHER): Payer: BLUE CROSS/BLUE SHIELD | Admitting: Internal Medicine

## 2016-06-06 VITALS — BP 110/70 | HR 80 | Ht 69.0 in | Wt 176.0 lb

## 2016-06-06 DIAGNOSIS — F32A Depression, unspecified: Secondary | ICD-10-CM

## 2016-06-06 DIAGNOSIS — F329 Major depressive disorder, single episode, unspecified: Secondary | ICD-10-CM

## 2016-06-06 DIAGNOSIS — F418 Other specified anxiety disorders: Secondary | ICD-10-CM

## 2016-06-06 DIAGNOSIS — Z0000 Encounter for general adult medical examination without abnormal findings: Secondary | ICD-10-CM

## 2016-06-06 DIAGNOSIS — E7800 Pure hypercholesterolemia, unspecified: Secondary | ICD-10-CM

## 2016-06-06 DIAGNOSIS — F419 Anxiety disorder, unspecified: Secondary | ICD-10-CM

## 2016-06-06 LAB — POC URINALSYSI DIPSTICK (AUTOMATED)
Bilirubin, UA: NEGATIVE
Glucose, UA: NEGATIVE
Ketones, UA: NEGATIVE
LEUKOCYTES UA: NEGATIVE
NITRITE UA: NEGATIVE
PH UA: 6.5
Protein, UA: NEGATIVE
Spec Grav, UA: 1.015
Urobilinogen, UA: NEGATIVE

## 2016-06-06 MED ORDER — ALPRAZOLAM 0.5 MG PO TABS: ORAL_TABLET | ORAL | 1 refills | Status: DC

## 2016-06-06 MED ORDER — FLUOXETINE HCL 20 MG PO CAPS: 20.0000 mg | ORAL_CAPSULE | Freq: Every day | ORAL | 2 refills | Status: DC

## 2016-06-06 NOTE — Progress Notes (Signed)
Subjective:    Patient ID: Lisa Golden, female    DOB: 04/23/1976, 41 y.o.   MRN: [id number]  HPI 41 year old Female for health maintenance exam and evaluation of medical issues.  Hx anxiety and depression. Treated with Prozac and prn Xanax.Xanax refilled today at her request.  She is on oral contraceptives. Has had issues with breakthrough bleeding before being started on oral contraceptives which was generally midcycle. Seen at [place].  She is divorced. She has met someone recently this past summer and began dating. He has older children. They are taking things slowly. She has one son who is 28 years old. After the birth of her son, she developed postpartum depression. Son was diagnosed with possible demyelinating illness because of sudden loss of vision in his left eye. Patient says that her husband had fertility issues and it was hard for her to conceive. In 2012 after 3 year absence, we saw her and placed her on Prozac and Xanax. We saw her again in February 2013 and she remained on Prozac and Xanax.  In 2016, she decided to taper off of Prozac and she did well until Sunday became ill at school. He had a syncopal episode which was frightening and traumatic for her. He was seen at the hospital and was checked out and found to have mild hydronephrosis that is being followed. All of this seemed to trigger posttraumatic stress inpatient. She had to go back on medication. Thus, she had to go back on medication.  Past medical history: Fractured right arm 1980. Had urinary tract infections 1995 and 1998. No history of serious illnesses accidents or operations.  Was diagnosed with influenza a February 2017. In December 2017 she was seen at [facility] with acute bronchitis treated initially with a Z-Pak but returned on December 22 here and was treated with Levaquin with success.  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 Does not smoke or consume  alcohol.  One brother. Parents and brother in good health. 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   All other systems reviewed and are negative.      Objective:   Physical Exam  Constitutional: She is oriented to person, place, and time. She appears well-developed and well-nourished.  HENT:  Head: Normocephalic and atraumatic.  Right Ear: External ear normal.  Left Ear: External ear normal.  Mouth/Throat: Oropharynx is clear and moist. No oropharyngeal exudate.  Eyes: Conjunctivae and EOM are normal. Pupils are equal, round, and reactive to light. Right eye exhibits no discharge. Left eye exhibits no discharge. No scleral icterus.  Neck: Neck supple. No JVD present. No thyromegaly present.  Cardiovascular: Normal rate, regular rhythm, normal heart sounds and intact distal pulses.   No murmur heard. Pulmonary/Chest: Effort normal and breath sounds normal. No respiratory distress. She has no wheezes. She has no rales. She exhibits no tenderness.  Abdominal: Soft. Bowel sounds are normal. She exhibits no distension and no mass. There is no tenderness. There is no rebound and no guarding.  Genitourinary:  Genitourinary Comments: Deferred to GYN  Musculoskeletal: She exhibits no edema.  Lymphadenopathy:    She has no cervical adenopathy.  Neurological: She is alert and oriented to person, place, and time. She has normal reflexes. No cranial nerve deficit. Coordination normal.  Skin: Skin is warm and dry. No rash noted. She is not diaphoretic.  Psychiatric:  She has a normal mood and affect. Her behavior is normal. Judgment and thought content normal.  Vitals reviewed.         Assessment & Plan:  History of anxiety depression-stable on Prozac and when necessary Xanax  Elevated LDL-118. Continue diet and exercise efforts  Plan: Return in one year or as needed.

## 2016-06-07 NOTE — Patient Instructions (Signed)
Continue Prozac daily and when necessary Xanax. Return in one year or as needed. Watch diet and try to get some exercise. LDL is elevated at 118

## 2016-06-27 ENCOUNTER — Telehealth: Payer: Self-pay | Admitting: Internal Medicine

## 2016-06-27 NOTE — Telephone Encounter (Signed)
Patient had a physical on 06/06/16 and it was coded with a 99213 diagnotic code and a code for anxiety was attached to it also.  She expressed that the insurance company is not going to pay for a routine physical because of the way it was coded.  She wants to know if that code can be changed to a code for a routine physical so the insurance company will pay for it 100%.

## 2016-06-27 NOTE — Telephone Encounter (Signed)
NO - we have explained this once. When you have additional problems a modifier is added. It will not be changed.

## 2017-02-26 DIAGNOSIS — Z01419 Encounter for gynecological examination (general) (routine) without abnormal findings: Secondary | ICD-10-CM | POA: Diagnosis not present

## 2017-02-26 DIAGNOSIS — Z1231 Encounter for screening mammogram for malignant neoplasm of breast: Secondary | ICD-10-CM | POA: Diagnosis not present

## 2017-02-26 LAB — HM MAMMOGRAPHY: HM MAMMO: NORMAL (ref 0–4)

## 2017-02-28 ENCOUNTER — Encounter: Payer: Self-pay | Admitting: Internal Medicine

## 2017-03-26 DIAGNOSIS — J0110 Acute frontal sinusitis, unspecified: Secondary | ICD-10-CM | POA: Diagnosis not present

## 2017-03-30 ENCOUNTER — Other Ambulatory Visit: Payer: Self-pay | Admitting: Internal Medicine

## 2017-05-17 ENCOUNTER — Other Ambulatory Visit: Payer: Self-pay | Admitting: Internal Medicine

## 2017-05-17 DIAGNOSIS — Z13220 Encounter for screening for lipoid disorders: Secondary | ICD-10-CM

## 2017-05-17 DIAGNOSIS — Z1321 Encounter for screening for nutritional disorder: Secondary | ICD-10-CM

## 2017-05-17 DIAGNOSIS — Z1329 Encounter for screening for other suspected endocrine disorder: Secondary | ICD-10-CM

## 2017-05-17 DIAGNOSIS — Z0000 Encounter for general adult medical examination without abnormal findings: Secondary | ICD-10-CM

## 2017-06-05 ENCOUNTER — Other Ambulatory Visit: Payer: 59 | Admitting: Internal Medicine

## 2017-06-05 DIAGNOSIS — Z1321 Encounter for screening for nutritional disorder: Secondary | ICD-10-CM | POA: Diagnosis not present

## 2017-06-05 DIAGNOSIS — Z0000 Encounter for general adult medical examination without abnormal findings: Secondary | ICD-10-CM

## 2017-06-05 DIAGNOSIS — Z13220 Encounter for screening for lipoid disorders: Secondary | ICD-10-CM | POA: Diagnosis not present

## 2017-06-05 DIAGNOSIS — Z1329 Encounter for screening for other suspected endocrine disorder: Secondary | ICD-10-CM

## 2017-06-06 LAB — COMPLETE METABOLIC PANEL WITH GFR
AG Ratio: 1.4 (calc) (ref 1.0–2.5)
ALBUMIN MSPROF: 4.4 g/dL (ref 3.6–5.1)
ALKALINE PHOSPHATASE (APISO): 44 U/L (ref 33–115)
ALT: 10 U/L (ref 6–29)
AST: 16 U/L (ref 10–30)
BILIRUBIN TOTAL: 0.7 mg/dL (ref 0.2–1.2)
BUN: 10 mg/dL (ref 7–25)
CHLORIDE: 104 mmol/L (ref 98–110)
CO2: 26 mmol/L (ref 20–32)
Calcium: 9.6 mg/dL (ref 8.6–10.2)
Creat: 0.78 mg/dL (ref 0.50–1.10)
GFR, Est African American: 109 mL/min/{1.73_m2} (ref >=60)
GFR, Est Non African American: 94 mL/min/{1.73_m2} (ref >=60)
GLUCOSE: 90 mg/dL (ref 65–99)
Globulin: 3.2 g/dL (calc) (ref 1.9–3.7)
Potassium: 4.7 mmol/L (ref 3.5–5.3)
Sodium: 140 mmol/L (ref 135–146)
Total Protein: 7.6 g/dL (ref 6.1–8.1)

## 2017-06-06 LAB — CBC WITH DIFFERENTIAL/PLATELET
BASOS PCT: 0.6 %
Basophils Absolute: 42 cells/uL (ref 0–200)
EOS PCT: 0.6 %
Eosinophils Absolute: 42 cells/uL (ref 15–500)
HCT: 40.1 % (ref 35.0–45.0)
Hemoglobin: 13.4 g/dL (ref 11.7–15.5)
LYMPHS ABS: 1645 {cells}/uL (ref 850–3900)
MCH: 30.1 pg (ref 27.0–33.0)
MCHC: 33.4 g/dL (ref 32.0–36.0)
MCV: 90.1 fL (ref 80.0–100.0)
MPV: 11.6 fL (ref 7.5–12.5)
Monocytes Relative: 7.7 %
NEUTROS PCT: 67.6 %
Neutro Abs: 4732 cells/uL (ref 1500–7800)
PLATELETS: 322 10*3/uL (ref 140–400)
RBC: 4.45 10*6/uL (ref 3.80–5.10)
RDW: 12 % (ref 11.0–15.0)
TOTAL LYMPHOCYTE: 23.5 %
WBC mixed population: 539 cells/uL (ref 200–950)
WBC: 7 10*3/uL (ref 3.8–10.8)

## 2017-06-06 LAB — LIPID PANEL
CHOL/HDL RATIO: 4.2 (calc) (ref <5.0)
Cholesterol: 230 mg/dL — ABNORMAL HIGH (ref <200)
HDL: 55 mg/dL (ref >50)
LDL CHOLESTEROL (CALC): 143 mg/dL — AB
NON-HDL CHOLESTEROL (CALC): 175 mg/dL — AB (ref <130)
TRIGLYCERIDES: 181 mg/dL — AB (ref <150)

## 2017-06-06 LAB — TSH: TSH: 1.73 mIU/L

## 2017-06-06 LAB — VITAMIN D 25 HYDROXY (VIT D DEFICIENCY, FRACTURES): VIT D 25 HYDROXY: 49 ng/mL (ref 30–100)

## 2017-06-07 ENCOUNTER — Ambulatory Visit (INDEPENDENT_AMBULATORY_CARE_PROVIDER_SITE_OTHER): Payer: 59 | Admitting: Internal Medicine

## 2017-06-07 ENCOUNTER — Encounter: Payer: Self-pay | Admitting: Internal Medicine

## 2017-06-07 VITALS — BP 120/78 | HR 98 | Ht 67.0 in | Wt 187.0 lb

## 2017-06-07 DIAGNOSIS — F329 Major depressive disorder, single episode, unspecified: Secondary | ICD-10-CM | POA: Diagnosis not present

## 2017-06-07 DIAGNOSIS — Z0000 Encounter for general adult medical examination without abnormal findings: Secondary | ICD-10-CM

## 2017-06-07 DIAGNOSIS — E782 Mixed hyperlipidemia: Secondary | ICD-10-CM | POA: Diagnosis not present

## 2017-06-07 DIAGNOSIS — F419 Anxiety disorder, unspecified: Secondary | ICD-10-CM

## 2017-06-07 DIAGNOSIS — Z6829 Body mass index (BMI) 29.0-29.9, adult: Secondary | ICD-10-CM | POA: Diagnosis not present

## 2017-06-07 DIAGNOSIS — F32A Depression, unspecified: Secondary | ICD-10-CM

## 2017-06-07 LAB — POCT URINALYSIS DIPSTICK
Appearance: NORMAL
Bilirubin, UA: NEGATIVE
GLUCOSE UA: NEGATIVE
Ketones, UA: NEGATIVE
LEUKOCYTES UA: NEGATIVE
NITRITE UA: NEGATIVE
Odor: NORMAL
Protein, UA: NEGATIVE
SPEC GRAV UA: 1.01 (ref 1.010–1.025)
Urobilinogen, UA: 0.2 E.U./dL
pH, UA: 6.5 (ref 5.0–8.0)

## 2017-06-07 NOTE — Progress Notes (Signed)
Subjective:    Patient ID: Lisa Golden, female    DOB: March 28, 1976, 42 y.o.   MRN: [id number]  HPI       42 year old Female in today for health maintenance exam.  In 2014 , weight was 164. Has gained 11 pounds over last year.  Does not get enough exercise.  Works a lot.  Talked about diet and exercise today.  History of anxiety and depression treated with Xanax and Prozac.  She is on oral contraceptives through [place].  Has had issues with breakthrough bleeding before being started on these which was generally mid cycle.  She is divorced and has met a gentleman to spend time with.  He has older children.  She has 1 son who is 23 years old.  After the birth of her son, she developed postpartum depression.  Son was diagnosed with a possible demyelinating illness because of sudden loss of vision in his left eye.  In 2012 after a 3-year absence from seeing her, we saw her and placed her on Prozac and Xanax.  In 2016 she had decided to taper off of Prozac and she did well until her son became ill at school.  He had a syncopal episode which was frightening and traumatic for her.  He was checked out and was found to have mild hydronephrosis but no significant neurological issues.  That she had to go back on her medication.  Past medical history: Influenza February 2017.  Fractured right arm 1980.  Had urinary tract infections 1995 in 1998.  No operations.  No serious illnesses.  She is been a patient in this practice since 1999.  Does not smoke or consume alcohol.  She has 1 brother.  Her parents are in good health as is her brother.  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    Review of Systems  Constitutional: Negative.   All other systems reviewed and are negative.      Objective:   Physical Exam  Constitutional: She is oriented to person, place, and time. She appears well-developed and well-nourished. No distress.  HENT:  Head:  Normocephalic and atraumatic.  Right Ear: External ear normal.  Left Ear: External ear normal.  Mouth/Throat: Oropharynx is clear and moist.  Eyes: EOM are normal. Pupils are equal, round, and reactive to light. Right eye exhibits no discharge. Left eye exhibits no discharge. No scleral icterus.  Neck: Neck supple. No JVD present. No thyromegaly present.  Cardiovascular: Normal rate, regular rhythm, normal heart sounds and intact distal pulses.  No murmur heard. Pulmonary/Chest: No respiratory distress. She has no wheezes. She has no rales.  Breasts normal female without masses  Abdominal: Soft. Bowel sounds are normal. She exhibits no distension and no mass. There is no tenderness. There is no rebound and no guarding.  Genitourinary:  Genitourinary Comments: Deferred to GYN  Musculoskeletal: She exhibits no edema.  Lymphadenopathy:    She has no cervical adenopathy.  Neurological: She is alert and oriented to person, place, and time. She has normal reflexes. No cranial nerve deficit. Coordination normal.  Skin: Skin is warm and dry. No rash noted. She is not diaphoretic.  Psychiatric: She has a normal mood and affect. Her behavior is normal. Judgment and thought content normal.  Vitals reviewed.         Assessment & Plan:  Normal health maintenance exam  Weight gain-talked about diet and exercise.  History of anxiety depression stable on  Prozac and Xanax  Hyperlipidemia-total cholesterol was 230, triglycerides 181 and LDL cholesterol 143.  This is significantly elevated more than last year.  BMI 29.29

## 2017-06-30 ENCOUNTER — Encounter: Payer: Self-pay | Admitting: Internal Medicine

## 2017-06-30 NOTE — Patient Instructions (Signed)
Work on diet exercise and weight loss.  Return in May for follow-up  Of mixed hyperlipidemia with fasting lipid panel.  Continue Prozac and Xanax.

## 2017-07-01 DIAGNOSIS — R69 Illness, unspecified: Secondary | ICD-10-CM | POA: Diagnosis not present

## 2017-07-06 ENCOUNTER — Other Ambulatory Visit: Payer: Self-pay | Admitting: Internal Medicine

## 2017-09-04 ENCOUNTER — Other Ambulatory Visit: Payer: Self-pay | Admitting: Internal Medicine

## 2017-09-04 DIAGNOSIS — E782 Mixed hyperlipidemia: Secondary | ICD-10-CM

## 2017-09-17 ENCOUNTER — Other Ambulatory Visit: Payer: 59 | Admitting: Internal Medicine

## 2017-09-17 DIAGNOSIS — E782 Mixed hyperlipidemia: Secondary | ICD-10-CM | POA: Diagnosis not present

## 2017-09-17 LAB — LIPID PANEL
CHOL/HDL RATIO: 3.7 (calc) (ref <5.0)
CHOLESTEROL: 214 mg/dL — AB (ref <200)
HDL: 58 mg/dL (ref >50)
LDL Cholesterol (Calc): 132 mg/dL (calc) — ABNORMAL HIGH
Non-HDL Cholesterol (Calc): 156 mg/dL (calc) — ABNORMAL HIGH (ref <130)
Triglycerides: 125 mg/dL (ref <150)

## 2017-09-20 ENCOUNTER — Ambulatory Visit: Payer: 59 | Admitting: Internal Medicine

## 2017-09-20 VITALS — BP 102/70 | HR 82 | Ht 67.0 in | Wt 190.0 lb

## 2017-09-20 DIAGNOSIS — E782 Mixed hyperlipidemia: Secondary | ICD-10-CM

## 2017-09-20 NOTE — Progress Notes (Signed)
   Subjective:    Patient ID: Lisa Golden, female    DOB: 1975/12/05, 42 y.o.   MRN: [id number]  HPI In today to follow-up on hyperlipidemia.  Says she has been watching her diet.  Total cholesterol has decreased from 2 30-2 14.  Triglycerides have improved from 181 to 125 which is excellent.  LDL cholesterol has decreased from 1 43 to 132.  We discussed statin therapy but she prefers to continue to work on diet and exercise with follow-up in February.    Review of Systems see above     Objective:   Physical Exam  Not examined but spent 15 minutes speaking with her about this issue.      Assessment & Plan:  Mixed hyperlipidemia  Plan: Results reviewed with her.  Triglycerides have improved considerably.  Still needs to work on LDL cholesterol.  Physical exam booked for February 2020.

## 2017-10-01 ENCOUNTER — Other Ambulatory Visit: Payer: Self-pay | Admitting: Internal Medicine

## 2017-10-03 ENCOUNTER — Other Ambulatory Visit: Payer: Self-pay | Admitting: Internal Medicine

## 2017-10-03 ENCOUNTER — Encounter: Payer: Self-pay | Admitting: Internal Medicine

## 2017-10-03 NOTE — Patient Instructions (Signed)
Continue to work on diet exercise and weight loss.  Physical exam booked for February 2020.

## 2017-10-04 DIAGNOSIS — R05 Cough: Secondary | ICD-10-CM | POA: Diagnosis not present

## 2017-10-04 DIAGNOSIS — R0982 Postnasal drip: Secondary | ICD-10-CM | POA: Diagnosis not present

## 2017-10-19 ENCOUNTER — Encounter: Payer: Self-pay | Admitting: Internal Medicine

## 2017-10-19 ENCOUNTER — Ambulatory Visit: Payer: 59 | Admitting: Internal Medicine

## 2017-10-19 VITALS — BP 100/70 | HR 84 | Temp 98.3°F | Ht 67.0 in | Wt 189.0 lb

## 2017-10-19 DIAGNOSIS — R05 Cough: Secondary | ICD-10-CM

## 2017-10-19 DIAGNOSIS — J069 Acute upper respiratory infection, unspecified: Secondary | ICD-10-CM

## 2017-10-19 DIAGNOSIS — R059 Cough, unspecified: Secondary | ICD-10-CM

## 2017-10-19 MED ORDER — PSEUDOEPH-BROMPHEN-DM 30-2-10 MG/5ML PO SYRP: 5.0000 mL | ORAL_SOLUTION | Freq: Four times a day (QID) | ORAL | 0 refills | Status: DC | PRN

## 2017-10-19 MED ORDER — LEVOFLOXACIN 500 MG PO TABS: 500.0000 mg | ORAL_TABLET | Freq: Every day | ORAL | 0 refills | Status: DC

## 2017-10-19 NOTE — Patient Instructions (Signed)
Bromfed-DM as directed for cough.  Levaquin 500 mg daily for 10 days.  Rest and drink plenty of fluids.

## 2017-10-19 NOTE — Progress Notes (Signed)
   Subjective:    Patient ID: Lisa Golden, female    DOB: [birth year], 42 y.o.   MRN: [id number]  HPI Patient had e- visit a couple of weeks ago [place] the [place].  Patient says Augmentin and Tessalon Perles were prescribed for her and she was told she likely had bronchitis.  She has had no fever.  She has persistent cough despite taking these medications.  Cough is nonproductive.  Says throat is sore.  Took entire course of Augmentin.  Says there is a tickle at times in her throat causing her to cough and that Tessalon Perles is not controlling cough.    Review of Systems see above     Objective:   Physical Exam Skin warm and dry.  No cervical adenopathy.  TMs clear.  Pharynx is injected without exudate.  Neck is supple.  Chest clear to auscultation without rales or wheezing.       Assessment & Plan:  Protracted respiratory infection  Cough  Plan: Levaquin 500 mg daily for 10 days.  Bromfed DM as directed for cough.  Says she cannot  tolerate codeine or hydrocodone.  Rest and drink plenty of fluids.  She had similar illness in December 2017 and responded to these medications.

## 2017-12-04 DIAGNOSIS — L820 Inflamed seborrheic keratosis: Secondary | ICD-10-CM | POA: Diagnosis not present

## 2017-12-04 DIAGNOSIS — Z86018 Personal history of other benign neoplasm: Secondary | ICD-10-CM | POA: Diagnosis not present

## 2017-12-04 DIAGNOSIS — D2261 Melanocytic nevi of right upper limb, including shoulder: Secondary | ICD-10-CM | POA: Diagnosis not present

## 2017-12-04 DIAGNOSIS — L918 Other hypertrophic disorders of the skin: Secondary | ICD-10-CM | POA: Diagnosis not present

## 2018-01-10 DIAGNOSIS — L918 Other hypertrophic disorders of the skin: Secondary | ICD-10-CM | POA: Diagnosis not present

## 2018-01-11 ENCOUNTER — Other Ambulatory Visit: Payer: Self-pay | Admitting: Internal Medicine

## 2018-03-01 DIAGNOSIS — Z6828 Body mass index (BMI) 28.0-28.9, adult: Secondary | ICD-10-CM | POA: Diagnosis not present

## 2018-03-01 DIAGNOSIS — Z1231 Encounter for screening mammogram for malignant neoplasm of breast: Secondary | ICD-10-CM | POA: Diagnosis not present

## 2018-03-01 DIAGNOSIS — Z01419 Encounter for gynecological examination (general) (routine) without abnormal findings: Secondary | ICD-10-CM | POA: Diagnosis not present

## 2018-04-14 ENCOUNTER — Other Ambulatory Visit: Payer: Self-pay | Admitting: Internal Medicine

## 2018-04-24 DIAGNOSIS — R0981 Nasal congestion: Secondary | ICD-10-CM | POA: Diagnosis not present

## 2018-04-24 DIAGNOSIS — R6889 Other general symptoms and signs: Secondary | ICD-10-CM | POA: Diagnosis not present

## 2018-04-24 DIAGNOSIS — Z20828 Contact with and (suspected) exposure to other viral communicable diseases: Secondary | ICD-10-CM | POA: Diagnosis not present

## 2018-06-06 ENCOUNTER — Other Ambulatory Visit: Payer: 59 | Admitting: Internal Medicine

## 2018-06-10 ENCOUNTER — Encounter: Payer: 59 | Admitting: Internal Medicine

## 2018-07-08 ENCOUNTER — Other Ambulatory Visit: Payer: Self-pay

## 2018-07-08 MED ORDER — FLUOXETINE HCL 20 MG PO CAPS: ORAL_CAPSULE | ORAL | 1 refills | Status: DC

## 2018-07-09 ENCOUNTER — Other Ambulatory Visit: Payer: 59 | Admitting: Internal Medicine

## 2018-07-09 DIAGNOSIS — E782 Mixed hyperlipidemia: Secondary | ICD-10-CM | POA: Diagnosis not present

## 2018-07-09 DIAGNOSIS — Z0000 Encounter for general adult medical examination without abnormal findings: Secondary | ICD-10-CM

## 2018-07-09 DIAGNOSIS — F419 Anxiety disorder, unspecified: Secondary | ICD-10-CM

## 2018-07-09 DIAGNOSIS — F329 Major depressive disorder, single episode, unspecified: Secondary | ICD-10-CM

## 2018-07-09 DIAGNOSIS — F32A Depression, unspecified: Secondary | ICD-10-CM

## 2018-07-10 LAB — CBC WITH DIFFERENTIAL/PLATELET
ABSOLUTE MONOCYTES: 662 {cells}/uL (ref 200–950)
BASOS ABS: 31 {cells}/uL (ref 0–200)
Basophils Relative: 0.4 %
Eosinophils Absolute: 69 cells/uL (ref 15–500)
Eosinophils Relative: 0.9 %
HEMATOCRIT: 38.9 % (ref 35.0–45.0)
HEMOGLOBIN: 13.2 g/dL (ref 11.7–15.5)
LYMPHS ABS: 2372 {cells}/uL (ref 850–3900)
MCH: 30.5 pg (ref 27.0–33.0)
MCHC: 33.9 g/dL (ref 32.0–36.0)
MCV: 89.8 fL (ref 80.0–100.0)
MPV: 11.8 fL (ref 7.5–12.5)
Monocytes Relative: 8.6 %
NEUTROS ABS: 4566 {cells}/uL (ref 1500–7800)
NEUTROS PCT: 59.3 %
Platelets: 326 10*3/uL (ref 140–400)
RBC: 4.33 10*6/uL (ref 3.80–5.10)
RDW: 12.2 % (ref 11.0–15.0)
Total Lymphocyte: 30.8 %
WBC: 7.7 10*3/uL (ref 3.8–10.8)

## 2018-07-10 LAB — COMPLETE METABOLIC PANEL WITH GFR
AG RATIO: 1.7 (calc) (ref 1.0–2.5)
ALBUMIN MSPROF: 4.6 g/dL (ref 3.6–5.1)
ALT: 22 U/L (ref 6–29)
AST: 15 U/L (ref 10–30)
Alkaline phosphatase (APISO): 61 U/L (ref 31–125)
BUN: 11 mg/dL (ref 7–25)
CALCIUM: 9.4 mg/dL (ref 8.6–10.2)
CO2: 22 mmol/L (ref 20–32)
Chloride: 104 mmol/L (ref 98–110)
Creat: 0.72 mg/dL (ref 0.50–1.10)
GFR, Est African American: 119 mL/min/{1.73_m2} (ref >=60)
GFR, Est Non African American: 103 mL/min/{1.73_m2} (ref >=60)
GLOBULIN: 2.7 g/dL (ref 1.9–3.7)
Glucose, Bld: 92 mg/dL (ref 65–99)
POTASSIUM: 4.5 mmol/L (ref 3.5–5.3)
SODIUM: 136 mmol/L (ref 135–146)
Total Bilirubin: 0.9 mg/dL (ref 0.2–1.2)
Total Protein: 7.3 g/dL (ref 6.1–8.1)

## 2018-07-10 LAB — LIPID PANEL
CHOL/HDL RATIO: 3.4 (calc) (ref <5.0)
Cholesterol: 215 mg/dL — ABNORMAL HIGH (ref <200)
HDL: 64 mg/dL (ref >=50)
LDL Cholesterol (Calc): 133 mg/dL (calc) — ABNORMAL HIGH
Non-HDL Cholesterol (Calc): 151 mg/dL (calc) — ABNORMAL HIGH (ref <130)
Triglycerides: 83 mg/dL (ref <150)

## 2018-07-10 LAB — VITAMIN D 25 HYDROXY (VIT D DEFICIENCY, FRACTURES): VIT D 25 HYDROXY: 41 ng/mL (ref 30–100)

## 2018-07-10 LAB — TSH: TSH: 1.94 m[IU]/L

## 2018-07-12 ENCOUNTER — Ambulatory Visit (INDEPENDENT_AMBULATORY_CARE_PROVIDER_SITE_OTHER): Payer: 59 | Admitting: Internal Medicine

## 2018-07-12 ENCOUNTER — Encounter: Payer: Self-pay | Admitting: Internal Medicine

## 2018-07-12 VITALS — BP 140/90 | HR 96 | Ht 67.0 in | Wt 190.0 lb

## 2018-07-12 DIAGNOSIS — R030 Elevated blood-pressure reading, without diagnosis of hypertension: Secondary | ICD-10-CM

## 2018-07-12 DIAGNOSIS — E7800 Pure hypercholesterolemia, unspecified: Secondary | ICD-10-CM | POA: Diagnosis not present

## 2018-07-12 DIAGNOSIS — F32A Depression, unspecified: Secondary | ICD-10-CM

## 2018-07-12 DIAGNOSIS — F419 Anxiety disorder, unspecified: Secondary | ICD-10-CM

## 2018-07-12 DIAGNOSIS — Z6829 Body mass index (BMI) 29.0-29.9, adult: Secondary | ICD-10-CM

## 2018-07-12 DIAGNOSIS — Z0000 Encounter for general adult medical examination without abnormal findings: Secondary | ICD-10-CM

## 2018-07-12 DIAGNOSIS — F329 Major depressive disorder, single episode, unspecified: Secondary | ICD-10-CM

## 2018-07-12 LAB — POCT URINALYSIS DIPSTICK
Appearance: NEGATIVE
Bilirubin, UA: NEGATIVE
GLUCOSE UA: NEGATIVE
KETONES UA: NEGATIVE
LEUKOCYTES UA: NEGATIVE
Nitrite, UA: NEGATIVE
Odor: NEGATIVE
Protein, UA: NEGATIVE
RBC UA: NEGATIVE
SPEC GRAV UA: 1.01 (ref 1.010–1.025)
Urobilinogen, UA: NEGATIVE E.U./dL — AB
pH, UA: 6.5 (ref 5.0–8.0)

## 2018-08-04 MED ORDER — ALPRAZOLAM 0.5 MG PO TABS: ORAL_TABLET | ORAL | 1 refills | Status: DC

## 2018-08-04 NOTE — Progress Notes (Signed)
Subjective:    Patient ID: Lisa Golden, female    DOB: Jun 13, 1975, 43 y.o.   MRN: [id number]  HPI pleasant 43 year old female in today for health maintenance exam and evaluation of medical issues.    She says she had flu early December.  Did not get flu vaccine.  Has felt well.  Blood pressure is elevated at 140/90.  Does have some anxiety issues.  Also takes Prozac in addition to Xanax.  Needs to monitor blood pressure at home and call if persistently elevated.    In 2014 her weight was 164 pounds.  In 2019 she weighed 187 pounds and now weighs 190 pounds.  Could benefit from diet exercise and weight loss.  This would help her blood pressure readings as well.  Discussion surrounding this today.  [facility] is GYN physician.  She is on oral contraceptives.  If blood pressure remains elevated she may need to come off of these.  She had issues with breakthrough bleeding before being started on these which was generally midcycle.  Social history: She is divorced and has a son who is 43 years old.  After the birth of her son, she developed postpartum depression.  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  She does not smoke or consume alcohol.  In 2012 after a 3-year absence from seeing her we saw her and placed her on Prozac and Xanax.  In 2016 she decided to taper off of Prozac and did well until her son became ill at school.  He had a syncopal episode which was frightening and traumatic for her.  He had no significant neurological issues and she at that point went back on Prozac and Xanax.  Past medical history: Influenza February 2017.  Fractured right arm 1980.  Urinary tract infections 1995 and 1998.  No operations.  No serious illnesses.     Family history: She has 1 brother.  Parents are in good health.    Review of Systems  Constitutional: Negative.   All other systems reviewed and are  negative.      Objective:   Physical Exam Vitals signs reviewed.  Constitutional:      General: She is not in acute distress.    Appearance: Normal appearance. She is not ill-appearing.  HENT:     Head: Normocephalic and atraumatic.     Right Ear: Tympanic membrane normal.     Left Ear: Tympanic membrane normal.     Nose: Nose normal.     Mouth/Throat:     Mouth: Mucous membranes are moist.     Pharynx: Oropharynx is clear.  Eyes:     General: No scleral icterus.       Right eye: No discharge.        Left eye: No discharge.     Extraocular Movements: Extraocular movements intact.     Conjunctiva/sclera: Conjunctivae normal.     Pupils: Pupils are equal, round, and reactive to light.  Neck:     Musculoskeletal: Neck supple. No neck rigidity.     Vascular: No carotid bruit.     Comments: No thyromegaly Cardiovascular:     Rate and Rhythm: Normal rate and regular rhythm.     Pulses: Normal pulses.     Heart sounds: Normal heart sounds. No murmur.  Pulmonary:     Effort: No respiratory distress.     Breath sounds: No wheezing or  rales.     Comments: Breast without masses Abdominal:     General: Bowel sounds are normal. There is no distension.     Palpations: Abdomen is soft. There is no mass.     Tenderness: There is no abdominal tenderness. There is no right CVA tenderness or left CVA tenderness.  Genitourinary:    Comments: Deferred to GYN Musculoskeletal:     Right lower leg: No edema.     Left lower leg: No edema.  Lymphadenopathy:     Cervical: No cervical adenopathy.  Skin:    General: Skin is warm and dry.     Findings: No rash.  Neurological:     General: No focal deficit present.     Mental Status: She is alert and oriented to person, place, and time.     Cranial Nerves: No cranial nerve deficit.     Sensory: No sensory deficit.     Motor: No weakness.     Coordination: Coordination normal.     Gait: Gait normal.  Psychiatric:        Mood and Affect:  Mood normal.        Behavior: Behavior normal.        Thought Content: Thought content normal.        Judgment: Judgment normal.           Assessment & Plan:  Elevated blood pressure reading-needs to monitor blood pressure at home and call if persistently elevated  BMI is 29.76.  May benefit from Dr. Migdalia Dk clinic.  Information provided.  Anxiety-treated with Xanax and Prozac.  Elevated LDL cholesterol of 133 and was 143 a year ago.  Give consideration to 3 times a week statin medication.  She will think about it.  She will be seen again in 6 months for lipid panel and hemoglobin A1c with office visit.

## 2018-08-04 NOTE — Patient Instructions (Signed)
Counsel regarding diet exercise and weight loss.  Lipids are elevated.  Continue Prozac and Klonopin.  Needs to follow-up in 6 months.

## 2019-01-07 ENCOUNTER — Other Ambulatory Visit: Payer: 59 | Admitting: Internal Medicine

## 2019-01-14 ENCOUNTER — Ambulatory Visit: Payer: 59 | Admitting: Internal Medicine

## 2019-01-22 ENCOUNTER — Other Ambulatory Visit: Payer: Self-pay | Admitting: Internal Medicine

## 2019-02-24 ENCOUNTER — Other Ambulatory Visit: Payer: Self-pay

## 2019-02-24 ENCOUNTER — Other Ambulatory Visit: Payer: 59 | Admitting: Internal Medicine

## 2019-02-24 DIAGNOSIS — E782 Mixed hyperlipidemia: Secondary | ICD-10-CM

## 2019-02-25 ENCOUNTER — Other Ambulatory Visit: Payer: 59 | Admitting: Internal Medicine

## 2019-02-25 LAB — LIPID PANEL
Cholesterol: 236 mg/dL — ABNORMAL HIGH (ref <200)
HDL: 61 mg/dL (ref >=50)
LDL Cholesterol (Calc): 153 mg/dL (calc) — ABNORMAL HIGH
Non-HDL Cholesterol (Calc): 175 mg/dL (calc) — ABNORMAL HIGH (ref <130)
Total CHOL/HDL Ratio: 3.9 (calc) (ref <5.0)
Triglycerides: 108 mg/dL (ref <150)

## 2019-02-25 LAB — HEMOGLOBIN A1C
Hgb A1c MFr Bld: 5.4 % of total Hgb (ref <5.7)
Mean Plasma Glucose: 108 (calc)
eAG (mmol/L): 6 (calc)

## 2019-02-28 ENCOUNTER — Encounter: Payer: Self-pay | Admitting: Internal Medicine

## 2019-02-28 ENCOUNTER — Ambulatory Visit: Payer: 59 | Admitting: Internal Medicine

## 2019-02-28 ENCOUNTER — Other Ambulatory Visit: Payer: Self-pay

## 2019-02-28 VITALS — BP 100/80 | HR 86 | Temp 97.8°F | Ht 67.0 in | Wt 191.0 lb

## 2019-02-28 DIAGNOSIS — Z23 Encounter for immunization: Secondary | ICD-10-CM

## 2019-02-28 DIAGNOSIS — E7800 Pure hypercholesterolemia, unspecified: Secondary | ICD-10-CM | POA: Diagnosis not present

## 2019-02-28 MED ORDER — ROSUVASTATIN CALCIUM 5 MG PO TABS: 5.0000 mg | ORAL_TABLET | Freq: Every day | ORAL | 2 refills | Status: DC

## 2019-02-28 NOTE — Progress Notes (Signed)
   Subjective:    Patient ID: Lisa Golden, female    DOB: [birth year], 43 y.o.   MRN: [id number]  HPI patient was seen in March for physical examination and was found to have total cholesterol of 215 and an LDL cholesterol of 133.  Despite family history, she was not willing to start statin medication.  Previously it had total cholesterol of 214 in May 2019 with an LDL of 132.  In January 2019 she had a total cholesterol of 230, triglycerides of 181 and an LDL cholesterol of 143.  BMI is 29.91.  Tries to diet and exercise.  Recent lipid panel done for this follow-up appointment shows total cholesterol of 236, HDL 61, triglycerides 108 and LDL cholesterol of 103.  Hemoglobin A1c is 5.4% and stable.  Flu vaccine given 02/28/2019  Review of Systems     Objective:   Physical Exam Blood pressure 100/80, pulse 86, temperature 97.8 pulse oximetry 91 weight 191 pounds BMI 29.91  Spent 15 minutes speaking with her about diet exercise and weight loss.  Spent time reviewing the lab results with increase in lipid values noted.     Assessment & Plan:  Pure hypercholesterolemia  Plan: Patient willing to start Crestor generic 5 mg 3 times a week and will follow up in March at time of physical examination.

## 2019-03-08 NOTE — Patient Instructions (Signed)
Flu vaccine given.  Start Crestor 5 mg 3 times a week and follow-up at physical exam in March

## 2019-03-28 ENCOUNTER — Other Ambulatory Visit: Payer: Self-pay | Admitting: Obstetrics and Gynecology

## 2019-03-28 DIAGNOSIS — N6320 Unspecified lump in the left breast, unspecified quadrant: Secondary | ICD-10-CM

## 2019-04-07 ENCOUNTER — Other Ambulatory Visit: Payer: Self-pay

## 2019-04-07 ENCOUNTER — Ambulatory Visit
Admission: RE | Admit: 2019-04-07 | Discharge: 2019-04-07 | Disposition: A | Discharge status: Home / Self Care | Payer: 59 | Source: Ambulatory Visit | Attending: Obstetrics and Gynecology | Admitting: Obstetrics and Gynecology

## 2019-04-07 ENCOUNTER — Ambulatory Visit
Admission: RE | Admit: 2019-04-07 | Discharge: 2019-04-07 | Disposition: A | Discharge status: Home / Self Care | Payer: BLUE CROSS/BLUE SHIELD | Source: Ambulatory Visit | Attending: Obstetrics and Gynecology | Admitting: Obstetrics and Gynecology

## 2019-04-07 ENCOUNTER — Other Ambulatory Visit: Payer: Self-pay | Admitting: Obstetrics and Gynecology

## 2019-04-07 DIAGNOSIS — N6320 Unspecified lump in the left breast, unspecified quadrant: Secondary | ICD-10-CM

## 2019-04-07 IMAGING — MG MM BREAST LOCALIZATION CLIP
4 series · 4 of 12 positions shown · non-contrast
Comparison: Previous exam(s).

CLINICAL DATA: Confirmation of clip placement after
ultrasound-guided core needle biopsy of a mass involving the LOWER
OUTER LEFT breast at ANTERIOR depth.

EXAM:
2D AND TOMOSYNTHESIS DIAGNOSTIC LEFT MAMMOGRAM POST ULTRASOUND
BIOPSY

[L ML synth-2D]
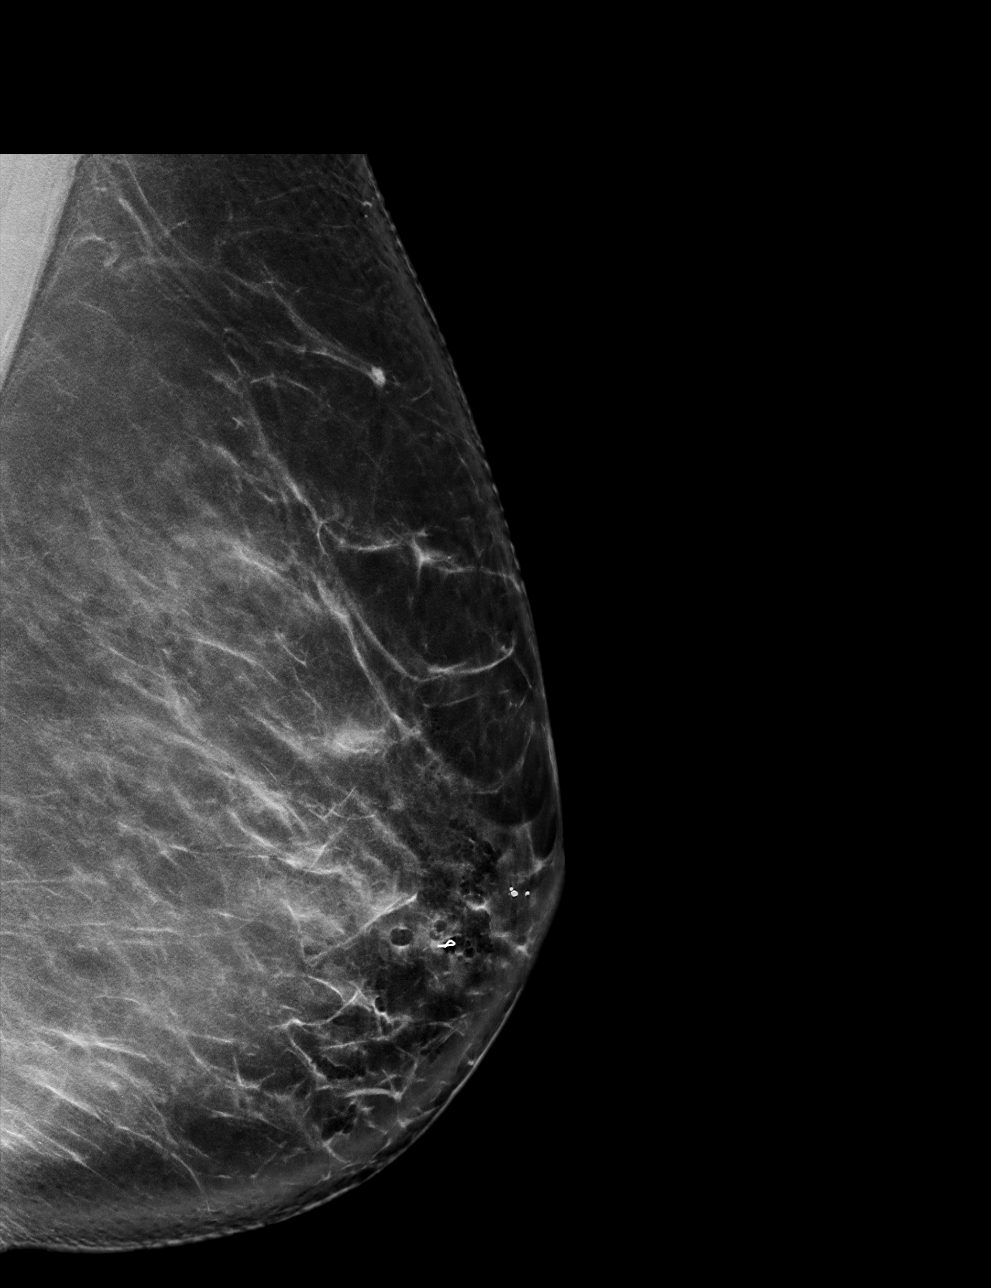

[L CC synth-2D]
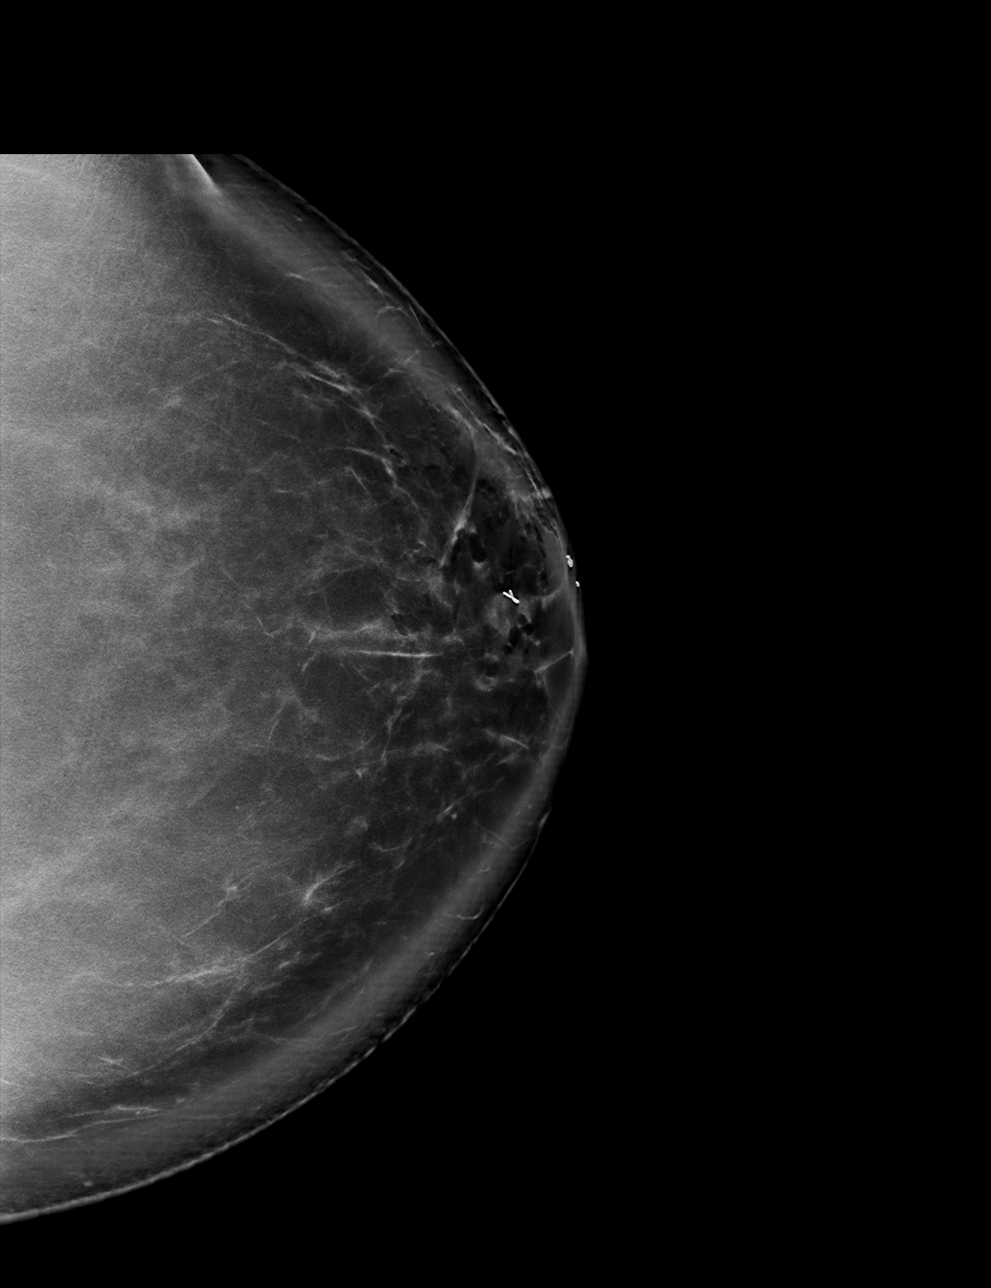

[L CC tomo · tomo slice 59/117.0]
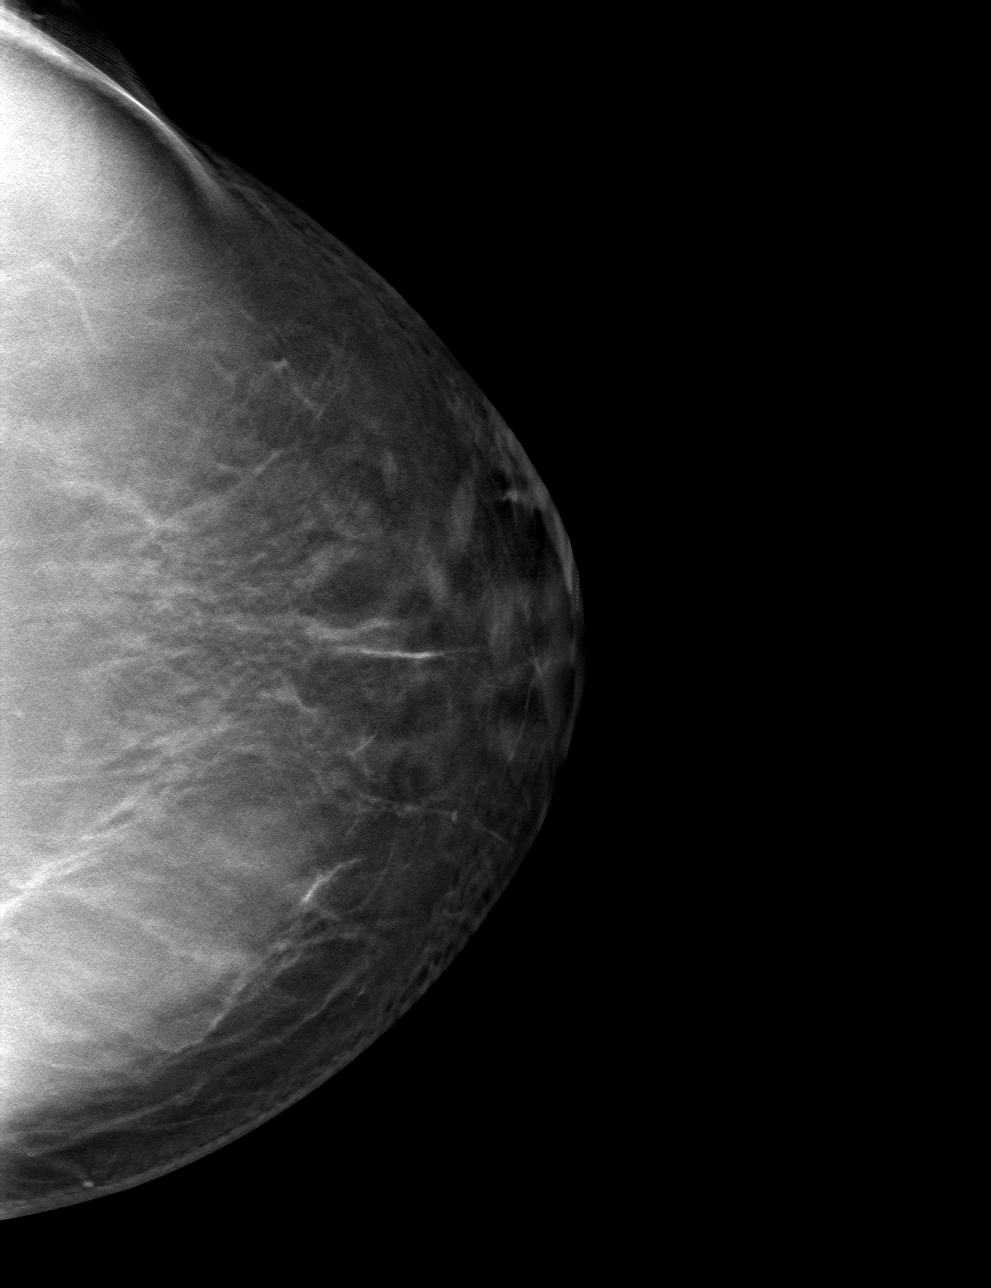

[L ML tomo · tomo slice 49/98.0]
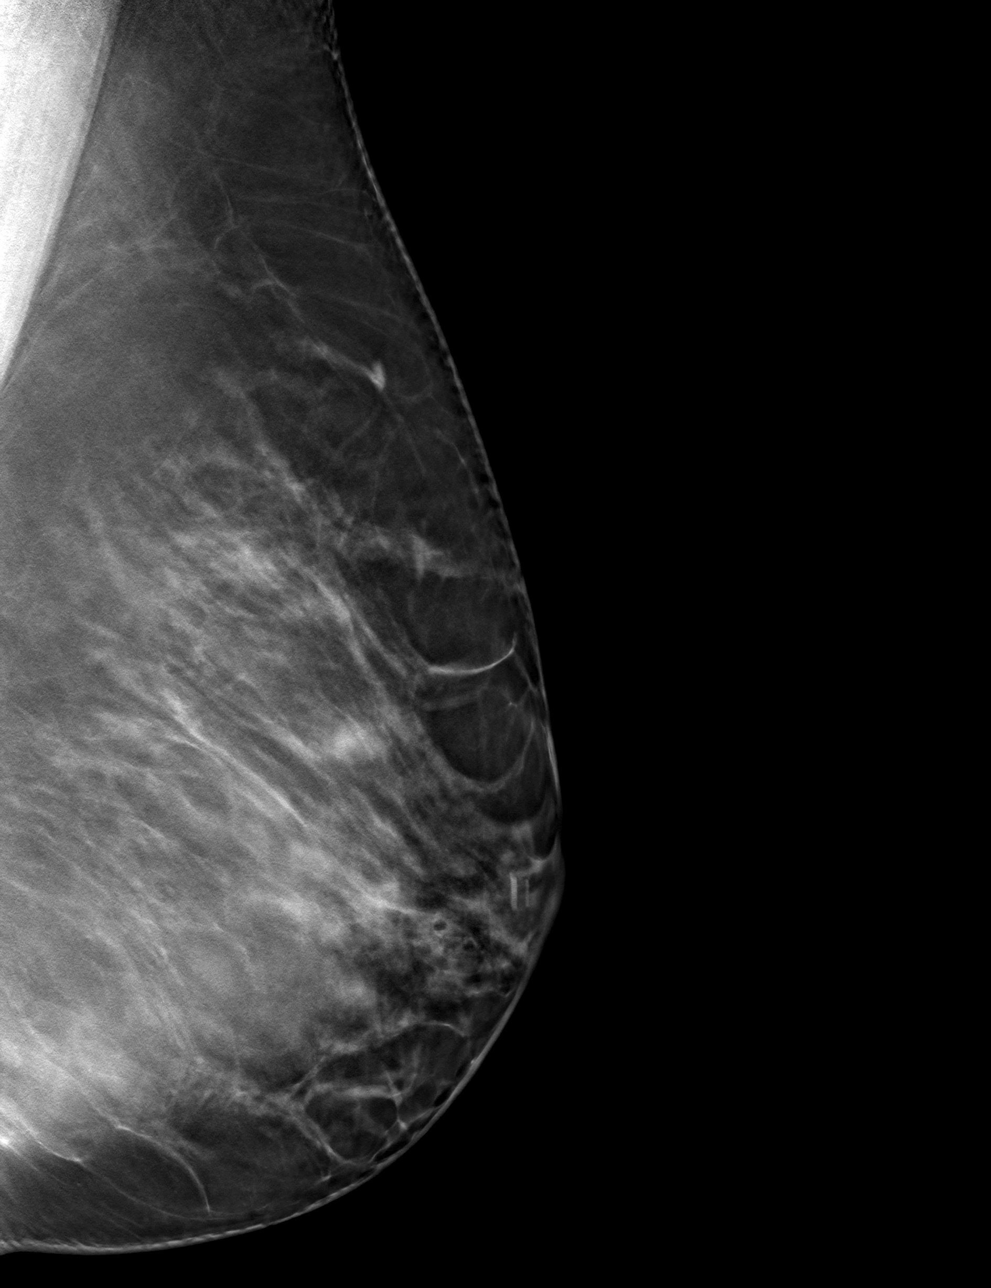

[4 of 12 positions shown; findings below may reference images not displayed]

FINDINGS: Tomosynthesis and synthesized full field CC and mediolateral images
were obtained following ultrasound guided biopsy of a suspicious
mass involving the LOWER OUTER LEFT breast at ANTERIOR depth. The
ribbon shaped tissue marking clip is appropriately positioned within
the biopsied mass.

Expected post biopsy changes are present without evidence of
hematoma.
IMPRESSION: Appropriate positioning of the ribbon shaped biopsy marking clip
within the biopsied mass in the LOWER OUTER LEFT breast at ANTERIOR
depth.

Final Assessment: Post Procedure Mammograms for Marker Placement

## 2019-04-07 IMAGING — US US BREAST*L* LIMITED INC AXILLA
1 series · 10 of 10 positions shown · non-contrast
Comparison: Previous exam(s).

CLINICAL DATA: The patient was called back from screening
mammography due to a left breast mass.

EXAM:
DIGITAL DIAGNOSTIC LEFT MAMMOGRAM WITH TOMO
ULTRASOUND LEFT BREAST

[Series 1: us breast*left* limited inc axilla · 0.06mm/px · 10 of 10 slices shown]
[im 1/10]
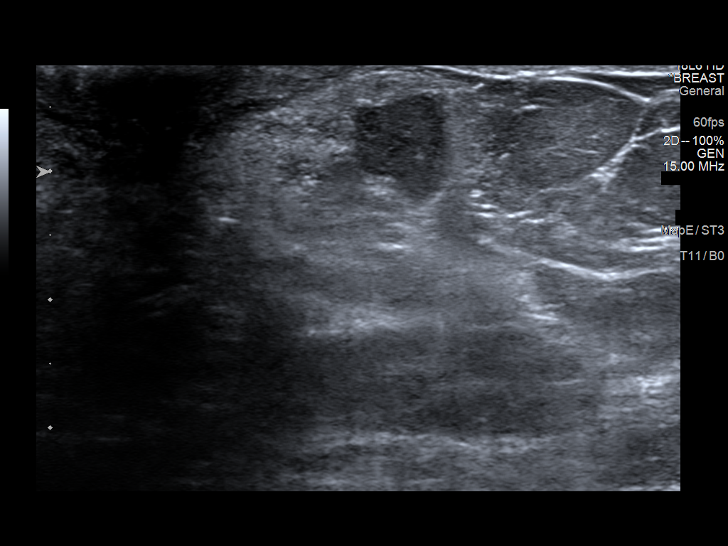
[im 2/10]
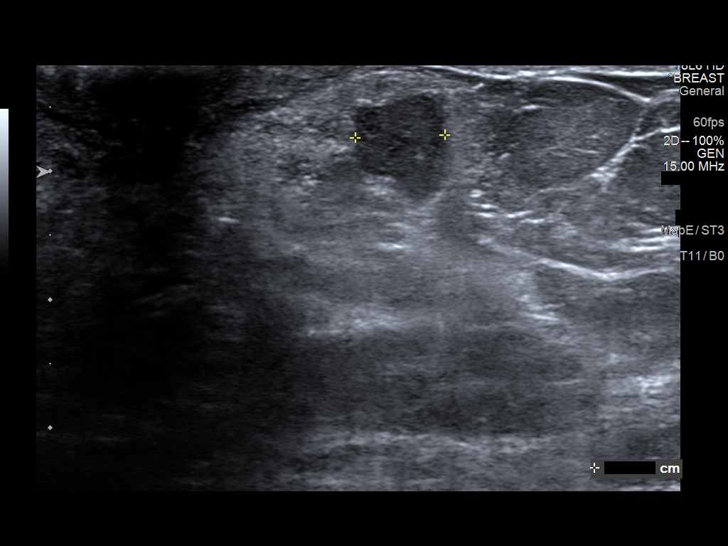
[im 3/10]
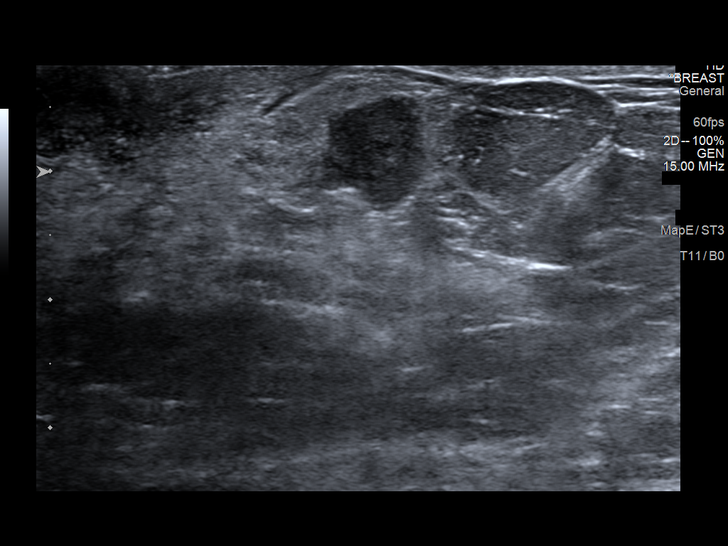
[im 4/10]
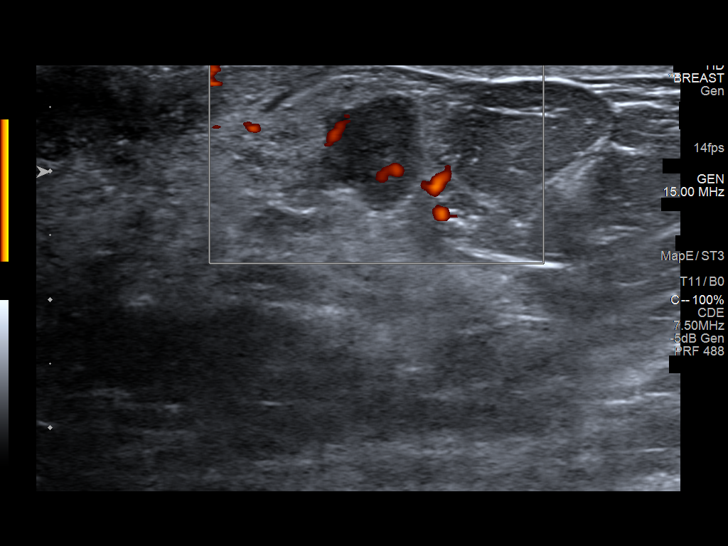
[im 5/10]
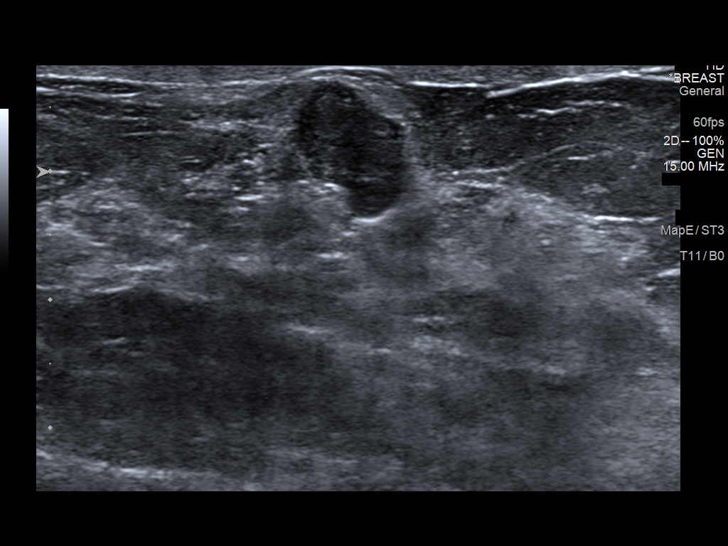
[im 6/10]
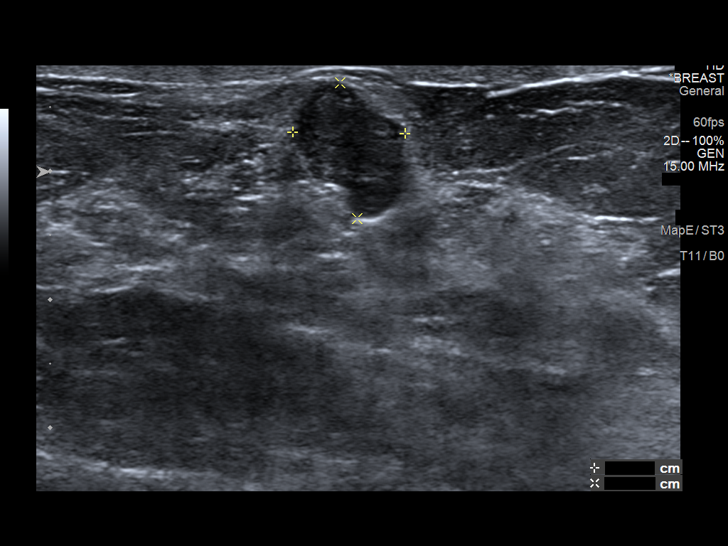
[im 7/10]
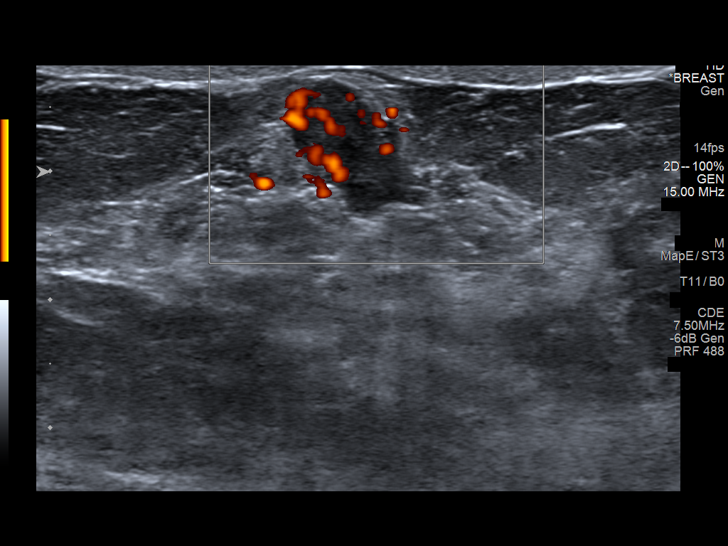
[im 8/10]
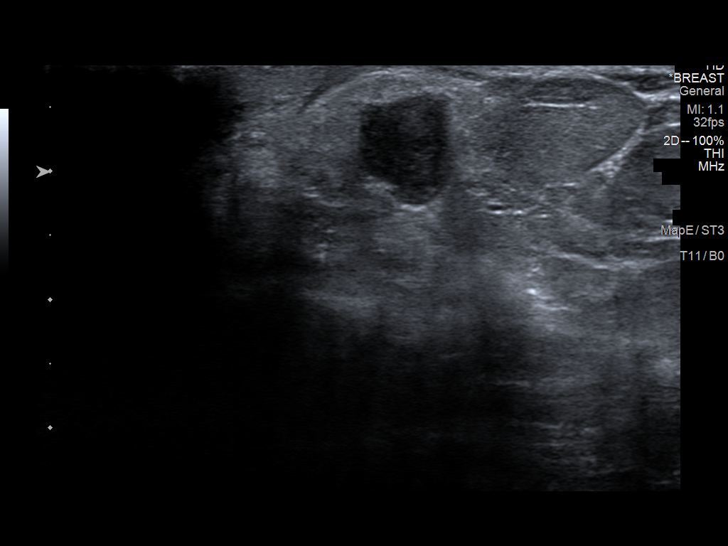
[im 9/10]
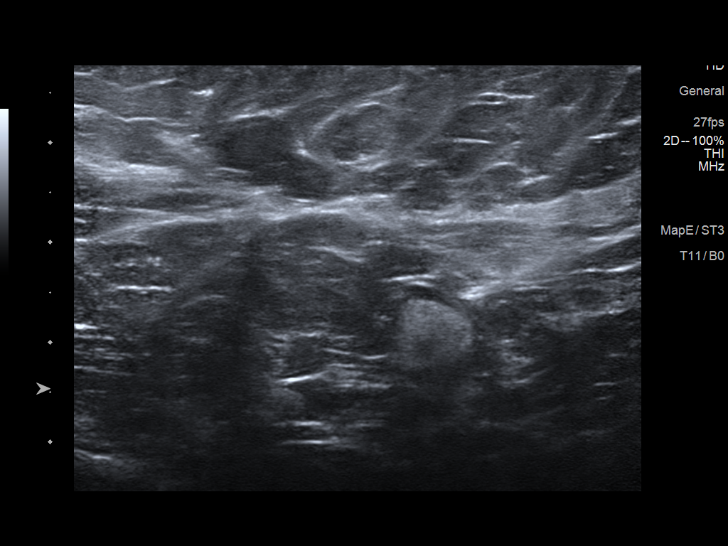
[im 10/10]
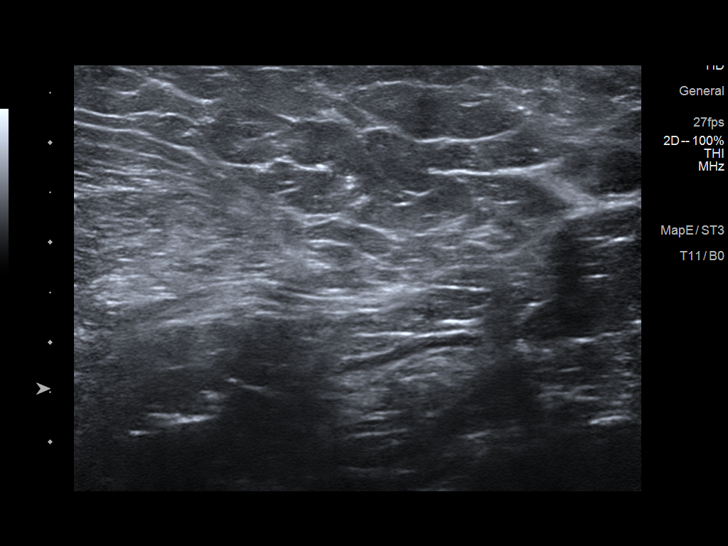

[10 of 10 positions shown; findings below may reference images not displayed]

ACR Breast Density Category b: There are scattered areas of
fibroglandular density.
FINDINGS: The left retroareolar breast mass persists on additional imaging and
represents a change.

On physical exam, no suspicious lumps are identified.

Targeted ultrasound is performed, showing a solid mass in the left
breast at 4 o'clock, 1 cm from the nipple measuring 7 by 9 x 11 mm
today. The mass is oval and taller than wide. No posterior shadowing
or increased through transmission. There is internal blood flow. No
axillary adenopathy.
IMPRESSION: Indeterminate left breast mass at 4 o'clock, 1 cm from the nipple.

RECOMMENDATION:
Ultrasound-guided biopsy of the 4 o'clock left breast mass.

I have discussed the findings and recommendations with the patient.
If applicable, a reminder letter will be sent to the patient
regarding the next appointment.

BI-RADS CATEGORY  4: Suspicious.

## 2019-04-07 IMAGING — US US BREAST BX W LOC DEV 1ST LESION IMG BX SPEC US GUIDE*L*
1 series · 11 of 11 positions shown · non-contrast
Comparison: Previous exam(s).
COMPARISON: Previous exam(s).

Addendum:
CLINICAL DATA: 43-year-old presenting with a screening detected
suspicious 1.1 cm mass involving the LOWER OUTER QUADRANT of the
LEFT breast at 4 o'clock position approximately 1 cm from nipple.

EXAM:
ULTRASOUND GUIDED LEFT BREAST CORE NEEDLE BIOPSY

[Series 1: us breast bx w loc dev 1st lesion img bx spec us g · 0.07mm/px · 11 of 11 slices shown]
[im 1/11]
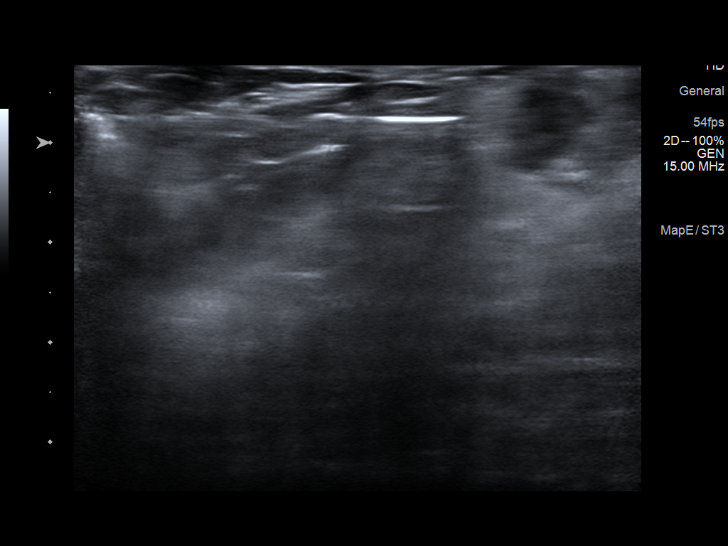
[im 2/11]
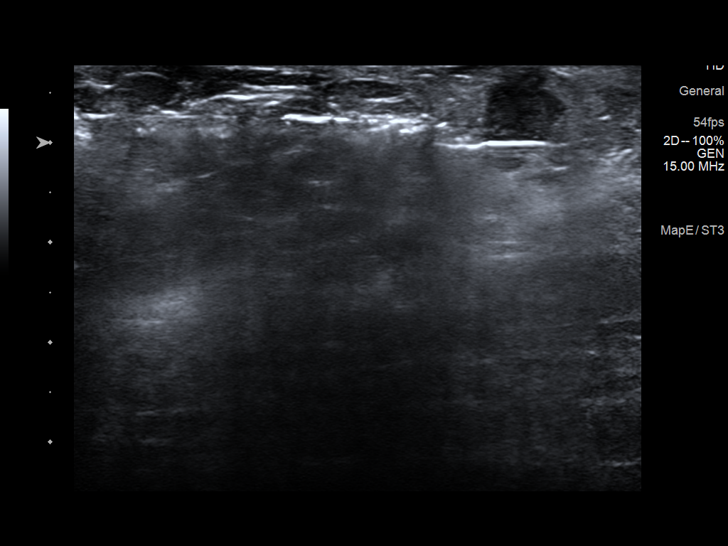
[im 3/11]
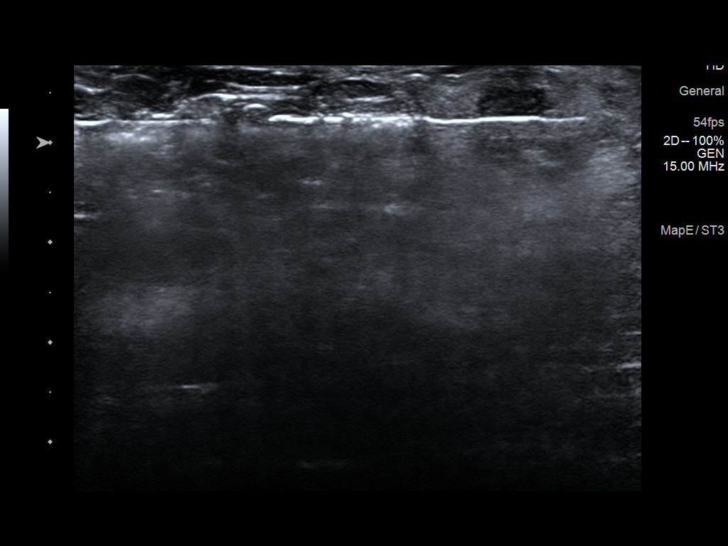
[im 4/11]
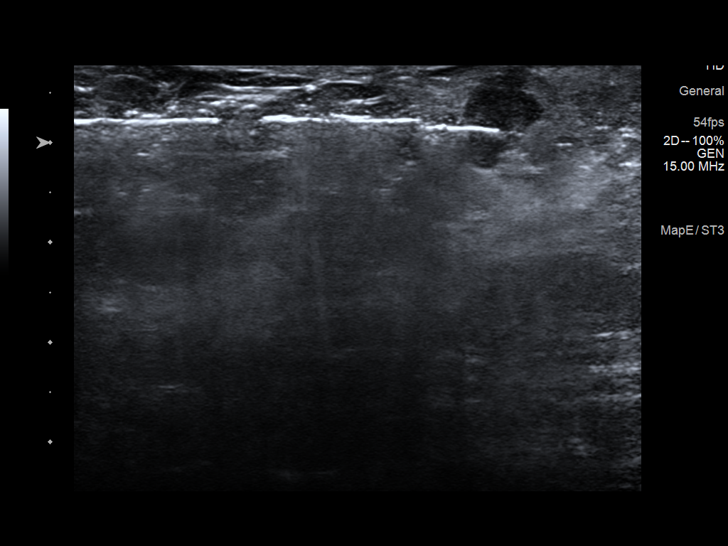
[im 5/11]
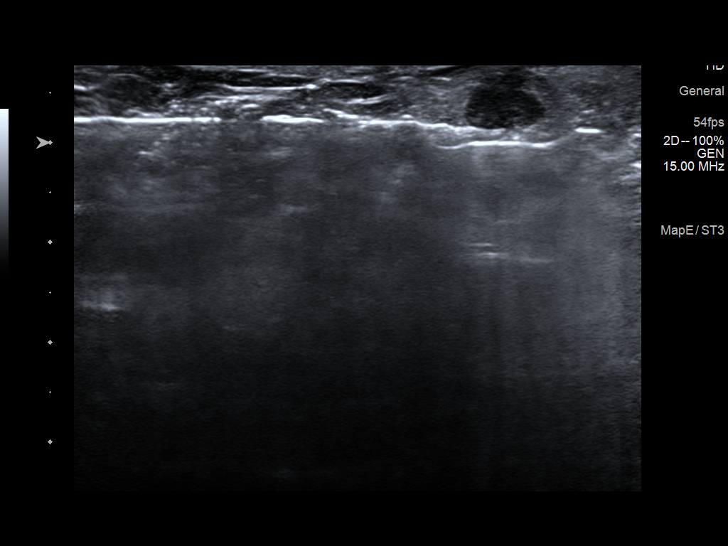
[im 6/11]
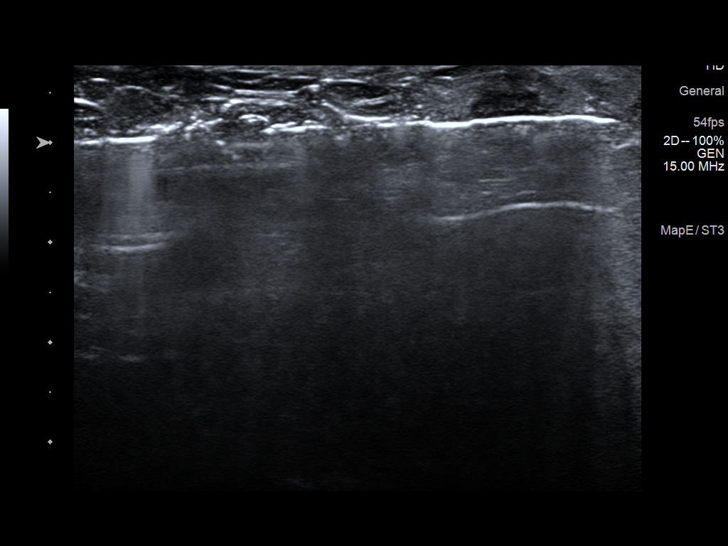
[im 7/11]
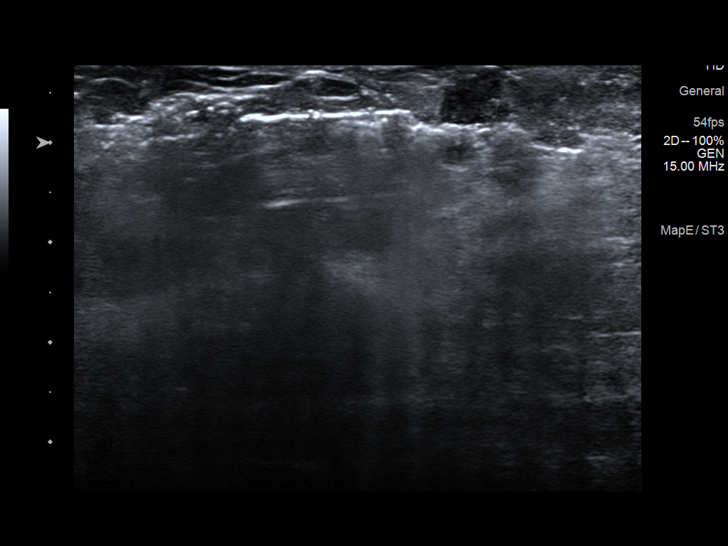
[im 8/11]
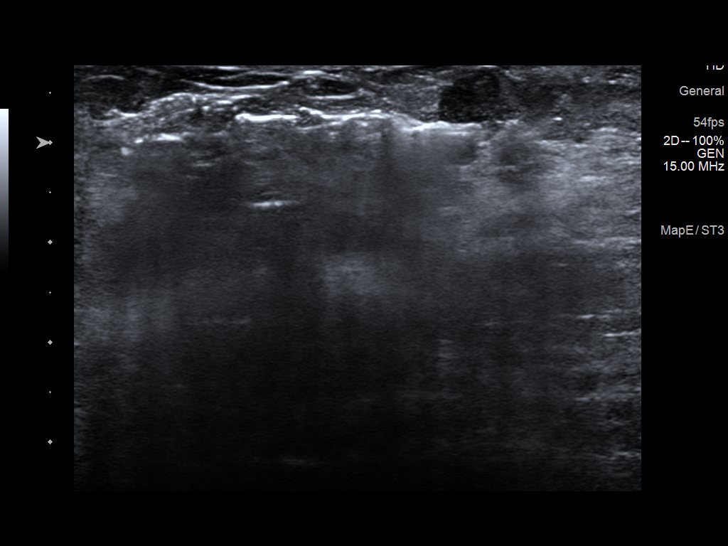
[im 9/11]
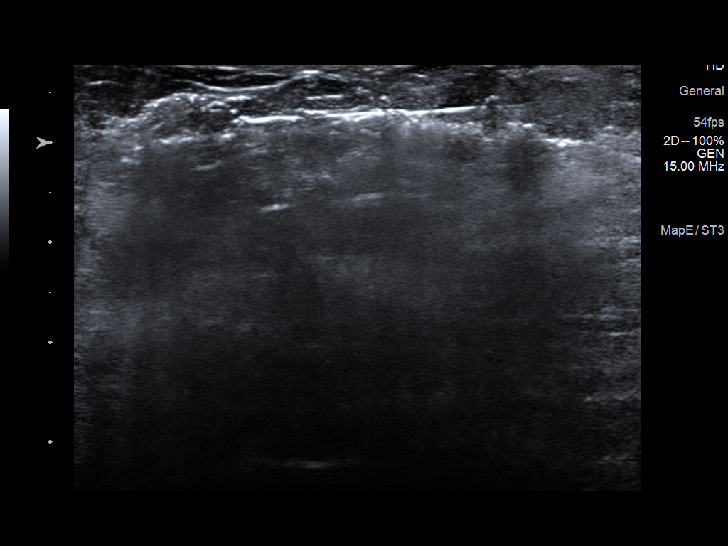
[im 10/11]
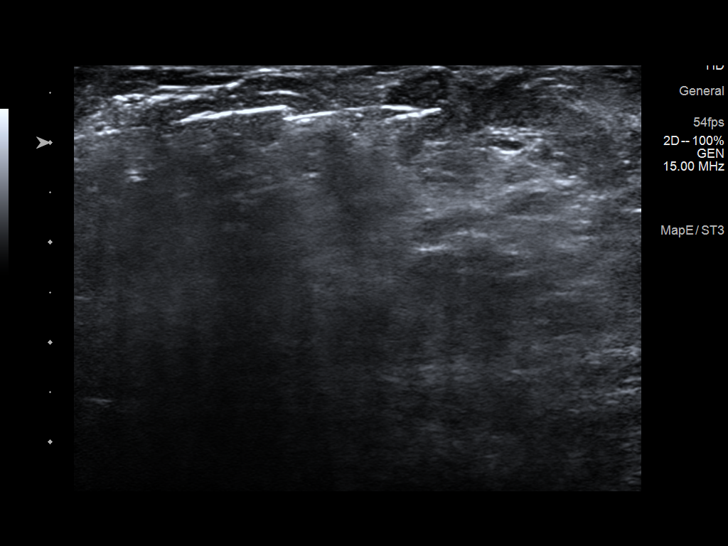
[im 11/11]
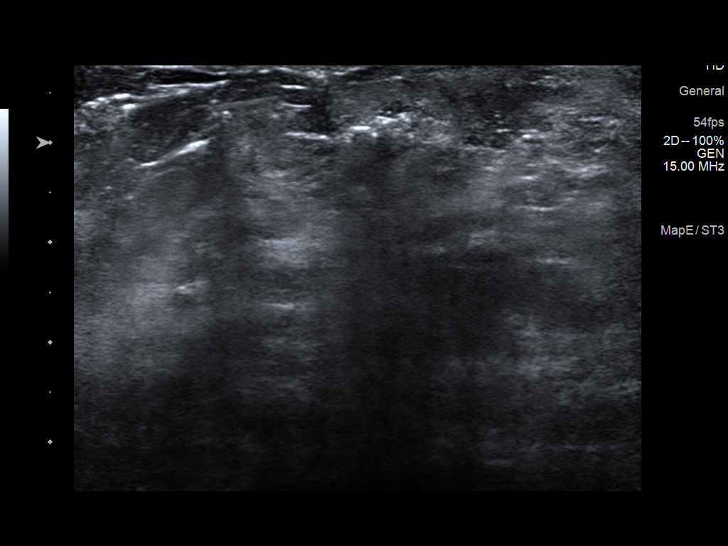

[11 of 11 positions shown; findings below may reference images not displayed]



Lesion quadrant: LOWER OUTER QUADRANT.

Using sterile technique with chlorhexidine as skin antisepsis, 1%
lidocaine and 1% lidocaine with epinephrine as local anesthetic,
under direct ultrasound visualization, a 12 gauge BENDECK core
device placed through an 11 gauge introducer needle was used to
perform biopsy of the mass in the LOWER OUTER LEFT breast at
ANTERIOR depth using a lateral approach. At the conclusion of the
procedure ribbon shaped tissue marker clip was deployed into the
biopsy cavity. Follow up 2 view mammogram was performed and dictated
separately.
IMPRESSION: Ultrasound guided biopsy of a suspicious 1.1 cm mass involving the
LOWER OUTER QUADRANT the LEFT breast at ANTERIOR depth. No apparent
complications.

ADDENDUM:
Pathology revealed COMPLEX SCLEROSING LESION of the Left breast,
lower outer quadrant, 4 o'clock, 1cm from nipple. The differential
includes an intraductal papilloma. This was found to be concordant
by Dr. BENDECK, with excision recommended.

Pathology results were discussed with the patient by telephone. The
patient reported doing well after the biopsy with tenderness at the
site. Post biopsy instructions and care were reviewed and questions
were answered. The patient was encouraged to call The [REDACTED]

Surgical consultation has been arranged with Dr. BENDECK at
[REDACTED] on [DATE].

Pathology results reported by BENDECK, RN on [DATE].



Lesion quadrant: LOWER OUTER QUADRANT.

Using sterile technique with chlorhexidine as skin antisepsis, 1%
lidocaine and 1% lidocaine with epinephrine as local anesthetic,
under direct ultrasound visualization, a 12 gauge BENDECK core
device placed through an 11 gauge introducer needle was used to
perform biopsy of the mass in the LOWER OUTER LEFT breast at
ANTERIOR depth using a lateral approach. At the conclusion of the
procedure ribbon shaped tissue marker clip was deployed into the
biopsy cavity. Follow up 2 view mammogram was performed and dictated
separately.
IMPRESSION: Ultrasound guided biopsy of a suspicious 1.1 cm mass involving the
LOWER OUTER QUADRANT the LEFT breast at ANTERIOR depth. No apparent
complications.

## 2019-04-07 IMAGING — MG MM DIGITAL DIAGNOSTIC UNILAT*L* W/ TOMO W/ CAD
4 series · 4 of 12 positions shown · non-contrast
Comparison: Previous exam(s).

CLINICAL DATA: The patient was called back from screening
mammography due to a left breast mass.

EXAM:
DIGITAL DIAGNOSTIC LEFT MAMMOGRAM WITH TOMO
ULTRASOUND LEFT BREAST

[L MLO synth-2D]
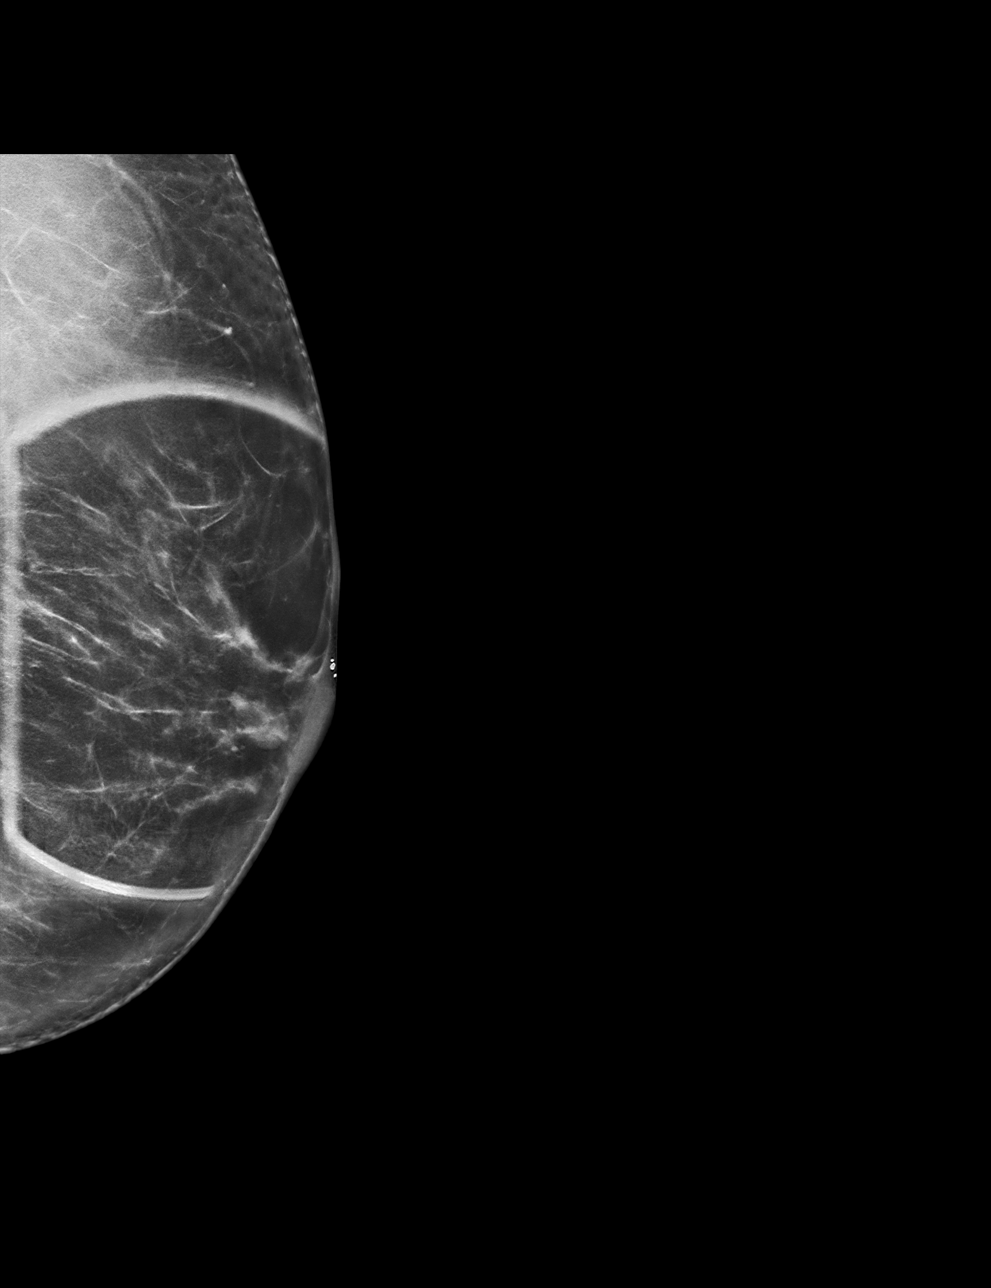

[L CC synth-2D]
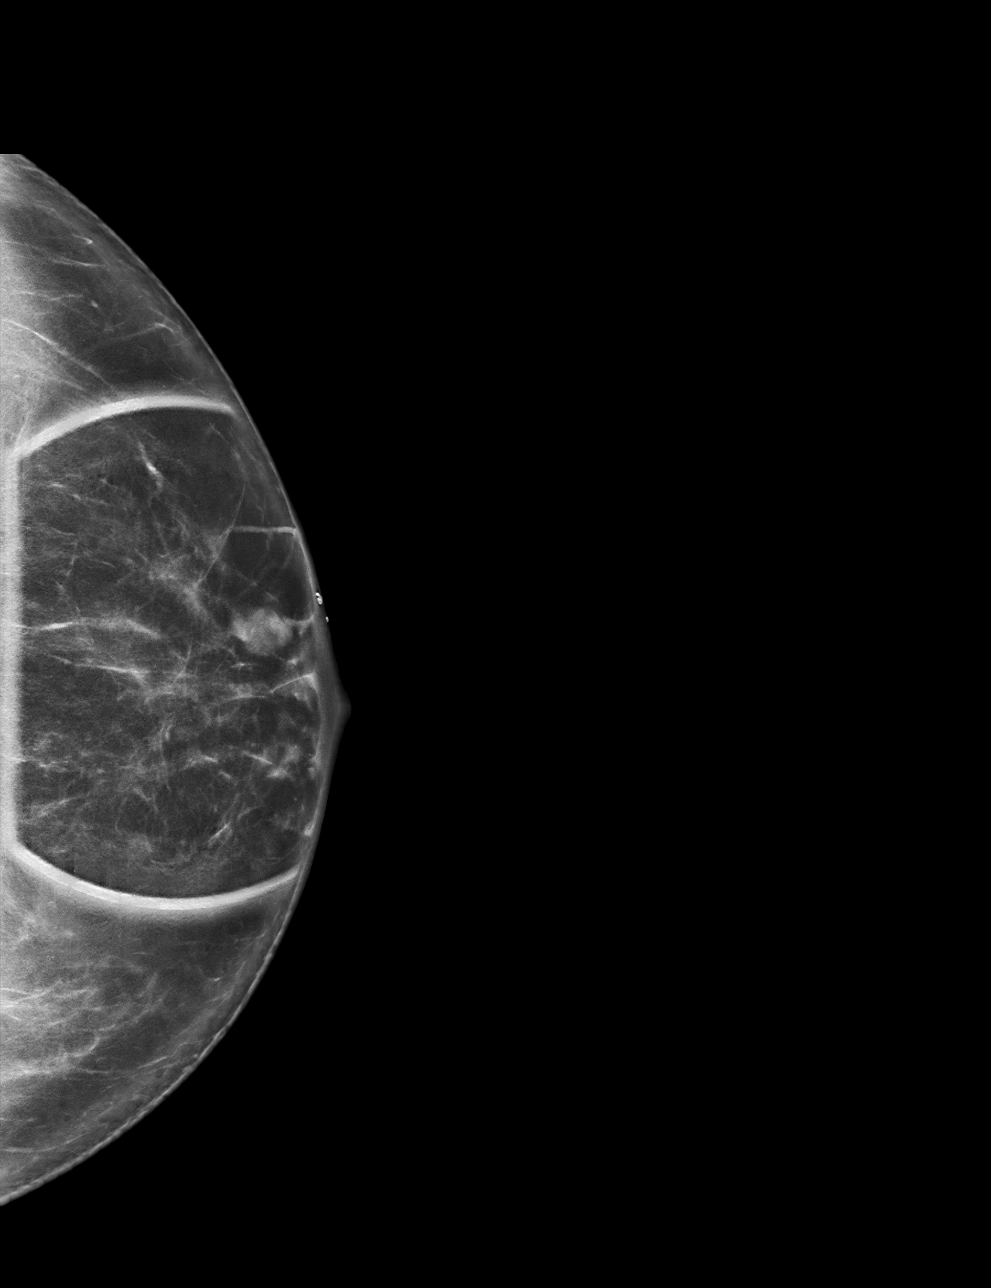

[L CC tomo · tomo slice 39/78.0]
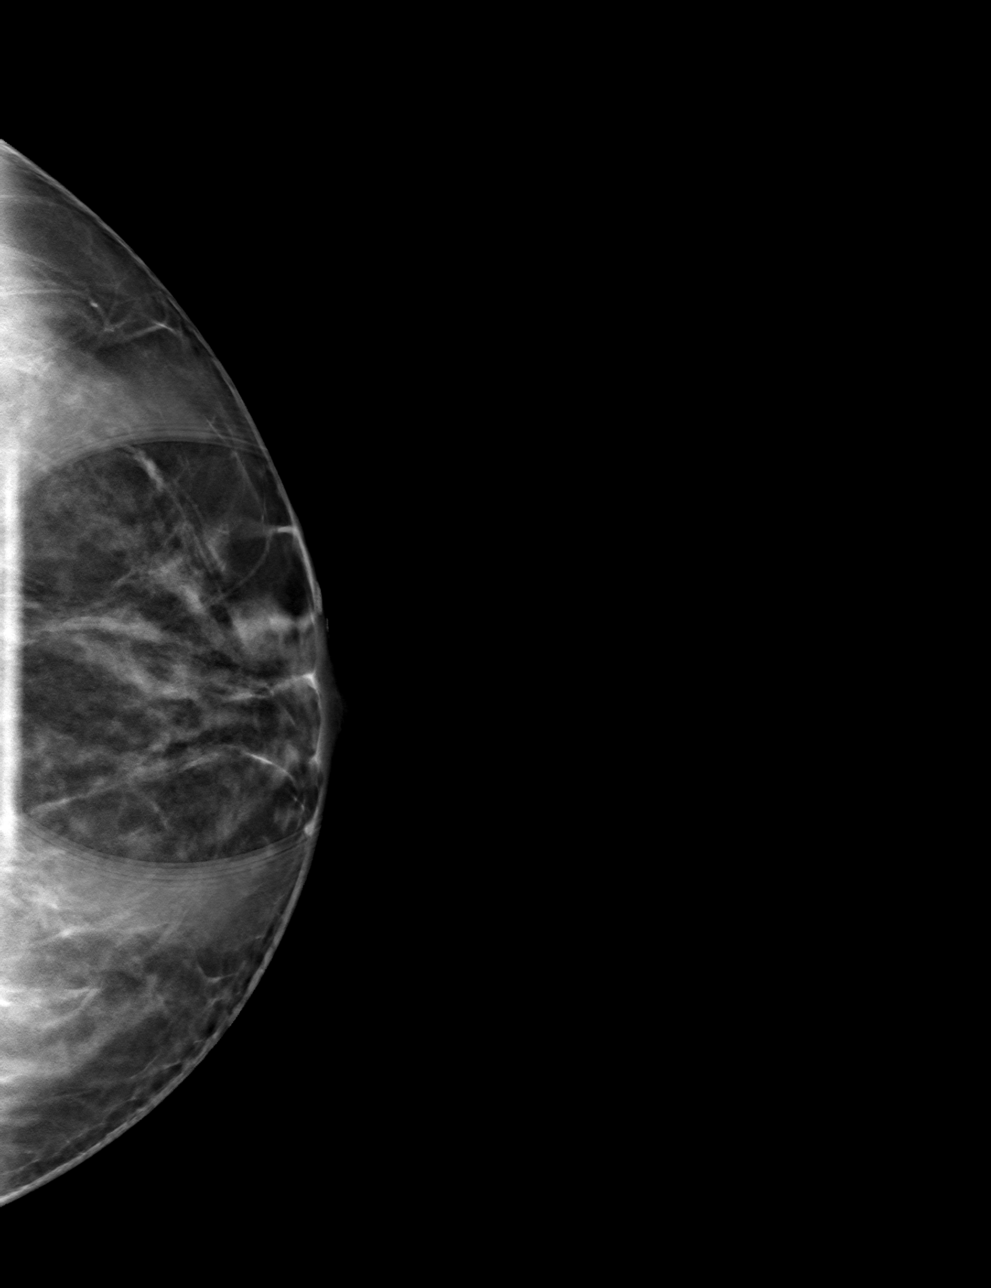

[L MLO tomo · tomo slice 41/81.0]
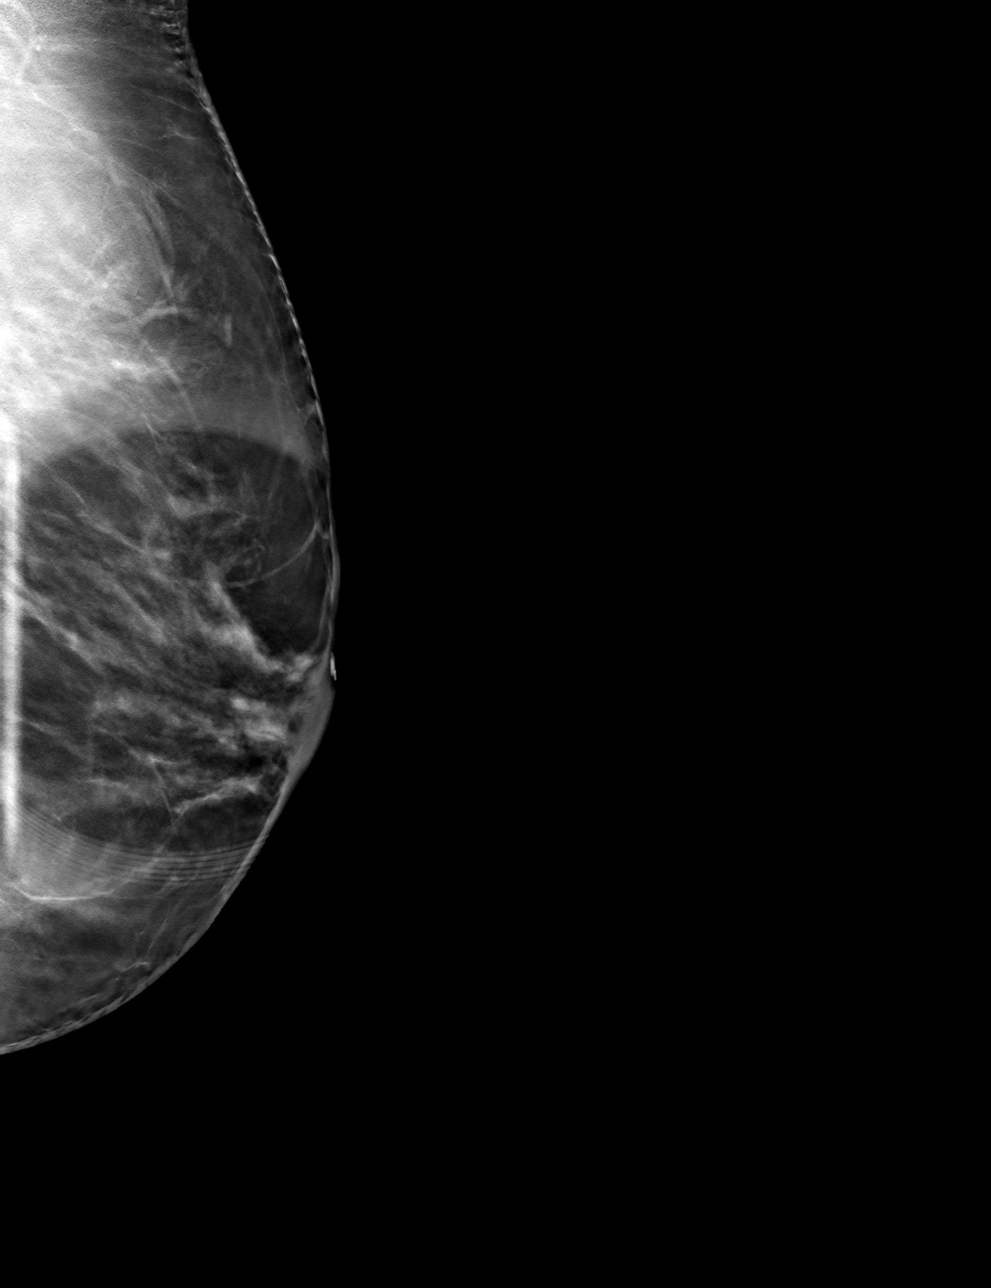

[4 of 12 positions shown; findings below may reference images not displayed]

ACR Breast Density Category b: There are scattered areas of
fibroglandular density.
FINDINGS: The left retroareolar breast mass persists on additional imaging and
represents a change.

On physical exam, no suspicious lumps are identified.

Targeted ultrasound is performed, showing a solid mass in the left
breast at 4 o'clock, 1 cm from the nipple measuring 7 by 9 x 11 mm
today. The mass is oval and taller than wide. No posterior shadowing
or increased through transmission. There is internal blood flow. No
axillary adenopathy.
IMPRESSION: Indeterminate left breast mass at 4 o'clock, 1 cm from the nipple.

RECOMMENDATION:
Ultrasound-guided biopsy of the 4 o'clock left breast mass.

I have discussed the findings and recommendations with the patient.
If applicable, a reminder letter will be sent to the patient
regarding the next appointment.

BI-RADS CATEGORY  4: Suspicious.

## 2019-04-21 DIAGNOSIS — Z8616 Personal history of COVID-19: Secondary | ICD-10-CM

## 2019-04-21 HISTORY — DX: Personal history of COVID-19: Z86.16

## 2019-05-12 ENCOUNTER — Ambulatory Visit: Payer: Self-pay | Admitting: Surgery

## 2019-05-12 DIAGNOSIS — N6489 Other specified disorders of breast: Secondary | ICD-10-CM

## 2019-05-12 NOTE — H&P (Signed)
Lisa Golden: 05/12/2019 9:46 AM Location: [facility] Patient #: [id number] DOB: 1976-03-11 Single / Language: Lisa Golden / Race: White Female  History of Present Illness Lisa Moores A. Immanuel Fedak MD; 05/12/2019 10:13 AM) Patient words: Patient presents for evaluation of abnormal left mammogram. Patient went screening mammogram and core biopsy which showed radial scar. She denies any history of breast pain, breast mass or nipple discharge. She has 2 first-degree relatives with breast cancer.     Diagnosis Breast, left, needle core biopsy, lower outer quadrant, 4 o'clock, 1cm from nipple - COMPLEX SCLEROSING LESION, SEE COMMENT.   The patient was called back from screening mammography due to a left breast mass.  EXAM: DIGITAL DIAGNOSTIC LEFT MAMMOGRAM WITH TOMO  ULTRASOUND LEFT BREAST  COMPARISON: Previous exam(s).  ACR Breast Density Category b: There are scattered areas of fibroglandular density.  FINDINGS: The left retroareolar breast mass persists on additional imaging and represents a change.  On physical exam, no suspicious lumps are identified.  Targeted ultrasound is performed, showing a solid mass in the left breast at 4 o'clock, 1 cm from the nipple measuring 7 by 9 x 11 mm today. The mass is oval and taller than wide. No posterior shadowing or increased through transmission. There is internal blood flow. No axillary adenopathy.  IMPRESSION: Indeterminate left breast mass at 4 o'clock, 1 cm from the nipple.  RECOMMENDATION: Ultrasound-guided biopsy of the 4 o'clock left breast mass.  I have discussed the findings and recommendations with the patient. If applicable, a reminder letter will be sent to the patient regarding the next appointment.  BI-RADS CATEGORY 4: Suspicious.   Electronically Signed By: Lisa Golden M.D On: 04/07/2019 10:07.  The patient is a 44 year old female.   Past Surgical History (Lisa Golden, [place];  05/12/2019 9:46 AM) Breast Biopsy Left. Oral Surgery  Diagnostic Studies History (Lisa Golden, [place]; 05/12/2019 9:46 AM) Colonoscopy never Mammogram within last year Pap Smear 1-5 years ago  Allergies (Lisa Golden, [place]; 05/12/2019 9:47 AM) No Known Drug Allergies [05/12/2019]: Allergies Reconciled  Medication History (Lisa Golden, [place]; 05/12/2019 9:48 AM) FLUoxetine HCl (20MG  Tablet, Oral) Active. Vitamin D (Oral) Specific strength unknown - Active. Rosuvastatin Calcium (20MG  Tablet, Oral) Active. ALPRAZolam (0.5MG  Tablet, Oral) Active. Medications Reconciled  Social History (Lisa Golden, [place]; 05/12/2019 9:46 AM) Alcohol use Occasional alcohol use. Caffeine use Coffee. No drug use Tobacco use Never smoker.  Family History (Lisa Golden, [place]; 05/12/2019 9:46 AM) Breast Cancer Family Members In General, Mother. Diabetes Mellitus Father. Heart Disease Father. Migraine Headache Mother. Seizure disorder Son.  Pregnancy / Birth History (Lisa Golden, [place]; 05/12/2019 9:46 AM) Age at menarche 63 years. Contraceptive History Oral contraceptives. Gravida 1 Maternal age 2-30 Para 1 Regular periods  Other Problems (Lisa Golden, [place]; 05/12/2019 9:46 AM) Anxiety Disorder Depression Hypercholesterolemia     Review of Systems (Lisa Golden; 05/12/2019 9:46 AM) General Not Present- Appetite Loss, Chills, Fatigue, Fever, Night Sweats, Weight Gain and Weight Loss. Skin Not Present- Change in Wart/Mole, Dryness, Hives, Jaundice, New Lesions, Non-Healing Wounds, Rash and Ulcer. HEENT Not Present- Earache, Hearing Loss, Hoarseness, Nose Bleed, Oral Ulcers, Ringing in the Ears, Seasonal Allergies, Sinus Pain, Sore Throat, Visual Disturbances, Wears glasses/contact lenses and Yellow Eyes. Respiratory Not Present- Bloody sputum, Chronic Cough, Difficulty Breathing, Snoring and Wheezing. Breast Present- Breast Mass. Not Present- Breast  Pain, Nipple Discharge and Skin Changes. Cardiovascular Not Present- Chest Pain, Difficulty Breathing Lying Down, Leg  Cramps, Palpitations, Rapid Heart Rate, Shortness of Breath and Swelling of Extremities. Gastrointestinal Not Present- Abdominal Pain, Bloating, Bloody Stool, Change in Bowel Habits, Chronic diarrhea, Constipation, Difficulty Swallowing, Excessive gas, Gets full quickly at meals, Hemorrhoids, Indigestion, Nausea, Rectal Pain and Vomiting. Female Genitourinary Not Present- Frequency, Nocturia, Painful Urination, Pelvic Pain and Urgency. Musculoskeletal Not Present- Back Pain, Joint Pain, Joint Stiffness, Muscle Pain, Muscle Weakness and Swelling of Extremities. Psychiatric Not Present- Anxiety, Bipolar, Change in Sleep Pattern, Depression, Fearful and Frequent crying. Endocrine Not Present- Cold Intolerance, Excessive Hunger, Hair Changes, Heat Intolerance, Hot flashes and New Diabetes.   Physical Exam (Lisa Goldston A. Makiyla Linch MD; 05/12/2019 10:13 AM)  General Mental Status-Alert. General Appearance-Consistent with stated age. Hydration-Well hydrated. Voice-Normal.  Chest and Lung Exam Chest and lung exam reveals -quiet, even and easy respiratory effort with no use of accessory muscles and on auscultation, normal breath sounds, no adventitious sounds and normal vocal resonance. Inspection Chest Wall - Normal. Back - normal.  Breast Breast - Left-Symmetric, Non Tender, No Biopsy scars, no Dimpling - Left, No Inflammation, No Lumpectomy scars, No Mastectomy scars, No Peau d' Orange. Breast - Right-Symmetric, Non Tender, No Biopsy scars, no Dimpling - Right, No Inflammation, No Lumpectomy scars, No Mastectomy scars, No Peau d' Orange. Breast Lump-No Palpable Breast Mass.  Cardiovascular Cardiovascular examination reveals -normal heart sounds, regular rate and rhythm with no murmurs and normal pedal pulses bilaterally.  Lymphatic Head & Neck  General Head & Neck  Lymphatics: Bilateral - Description - Normal. Axillary  General Axillary Region: Bilateral - Description - Normal. Tenderness - Non Tender.    Assessment & Plan (Lisa Friese A. 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 She has 2 first-degree relatives who have been diagnosed with breast cancer as well. Recommend left breast lumpectomy seed localized. Discussed medical management and follow-up with surveillance as well. She has agreed to left breast lumpectomy. Risk of lumpectomy include bleeding, infection, seroma, more surgery, use of seed/wire, wound care, cosmetic deformity and the need for other treatments, death , blood clots, death. Pt agrees to proceed.  Current Plans Pt Education - [facility] Free Text Education/Instructions: discussed with patient and provided information. Pt Education - [facility] Breast Biopsy HCI: discussed with patient and provided information. Pt Education - [facility] - General recommendations Pt Education - [facility] Free Text Education/Instructions: discussed with patient and provided information.  FAMILY HISTORY OF BREAST CANCER IN MOTHER (Z80.3) Impression: Refer for genetic screening given 2 first-degree relatives diagnosed with breast cancer.  TC risks over 10 year 5.4% lifetime risk of 29% Total time spent with patient include 15 minutes face-to-face for physical examination, history and review of systems. 15 minutes for chart review, x-ray review, calculation of genetic risk and review of records as well as documentation for a total time of 30 minutes

## 2019-05-16 ENCOUNTER — Telehealth: Payer: Self-pay | Admitting: Genetic Counselor

## 2019-05-16 NOTE — Telephone Encounter (Signed)
Received a genetic counseling referral from Dr. Brantley Stage for fhx of breast cancer. Pt has been cld and scheduled to see Clint Guy for a video visit. I verified the pt's demographics, including her email address and that she has an active mychart acct.

## 2019-05-21 ENCOUNTER — Other Ambulatory Visit: Payer: Self-pay | Admitting: Surgery

## 2019-05-21 DIAGNOSIS — N6489 Other specified disorders of breast: Secondary | ICD-10-CM

## 2019-05-22 ENCOUNTER — Ambulatory Visit (HOSPITAL_BASED_OUTPATIENT_CLINIC_OR_DEPARTMENT_OTHER): Payer: 59 | Admitting: Genetic Counselor

## 2019-05-22 ENCOUNTER — Encounter: Payer: Self-pay | Admitting: Genetic Counselor

## 2019-05-22 DIAGNOSIS — Z808 Family history of malignant neoplasm of other organs or systems: Secondary | ICD-10-CM | POA: Insufficient documentation

## 2019-05-22 DIAGNOSIS — Z803 Family history of malignant neoplasm of breast: Secondary | ICD-10-CM | POA: Diagnosis not present

## 2019-05-22 NOTE — Progress Notes (Signed)
REFERRING PROVIDER: Erroll Luna, MD [address] Lisa Golden,  [place] [address]  PRIMARY PROVIDER:  Elby Showers, MD  PRIMARY REASON FOR VISIT:  1. Family history of breast cancer   2. Family history of brain cancer   3. Family history of melanoma      I connected with Lisa Golden on 05/22/2019 at 10:00 am EDT by MyChart video conference and verified that I am speaking with the correct person using two identifiers.   Patient location: Work Provider location: Lisa Golden  HISTORY OF PRESENT ILLNESS:   Lisa Golden, a 44 y.o. female, was seen for a Lisa Golden cancer genetics consultation at the request of Lisa Golden due to a family history of breast cancer.  Lisa Golden presents to clinic today to discuss the possibility of a hereditary predisposition to cancer, genetic testing, and to further clarify her future cancer risks, as well as potential cancer risks for family members.   Lisa Golden is a 44 y.o. female with no personal history of cancer.    CANCER HISTORY:  Oncology History   No history exists.     RISK FACTORS:  Menarche was at age 18-13.  First live birth at age 33.  OCP use for approximately 17-18 years.  Ovaries intact: yes.  Hysterectomy: no.  Menopausal status: premenopausal.  HRT use: 0 years. Colonoscopy: no. Mammogram within the last year: yes. Number of breast biopsies: 1. Any excessive radiation exposure in the past: no  Past Medical History:  Diagnosis Date  . Anxiety   . Depression   . Family history of brain cancer   . Family history of breast cancer   . Family history of melanoma     No past surgical history on file.  Social History   Socioeconomic History  . Marital status: Single    Spouse name: Not on file  . Number of children: Not on file  . Years of education: Not on file  . Highest education level: Not on file  Occupational History  . Not on file  Tobacco Use  . Smoking status: Never Smoker  . Smokeless  tobacco: Never Used  Substance and Sexual Activity  . Alcohol use: No  . Drug use: No  . Sexual activity: Yes    Birth control/protection: None  Other Topics Concern  . Not on file  Social History Narrative  . Not on file   Social Determinants of Health   Financial Resource Strain:   . Difficulty of Paying Living Expenses: Not on file  Food Insecurity:   . Worried About Charity fundraiser in the Last Year: Not on file  . Ran Out of Food in the Last Year: Not on file  Transportation Needs:   . Lack of Transportation (Medical): Not on file  . Lack of Transportation (Non-Medical): Not on file  Physical Activity:   . Days of Exercise per Week: Not on file  . Minutes of Exercise per Session: Not on file  Stress:   . Feeling of Stress : Not on file  Social Connections:   . Frequency of Communication with Friends and Family: Not on file  . Frequency of Social Gatherings with Friends and Family: Not on file  . Attends Religious Services: Not on file  . Active Member of Clubs or Organizations: Not on file  . Attends Archivist Meetings: Not on file  . Marital Status: Not on file     FAMILY HISTORY:  We obtained a detailed, 4-generation family history.  Significant diagnoses are listed below: Family History  Problem Relation Age of Onset  . Breast cancer Mother 80  . Gout Father   . Kidney Stones Father   . Breast cancer Maternal Grandmother 47  . Brain cancer Paternal Uncle        dx. early 68s   Lisa Golden has one son, [name], who is 29. She has a brother who is 75, and a paternal half-sister who is 51. None of these individuals have had cancer.  Lisa Golden mother was recently diagnosed with breast cancer at age 40. She has two maternal uncles and two maternal aunts, all in their 28s and 67s. None of her aunts and uncles or first cousins have had cancer to Lisa Golden's knowledge. Her maternal grandmother was diagnosed with breast cancer at age 41, although she notes  that her grandmother thought she had breast cancer for a few years prior to her official diagnosis. Lisa Golden had a great-aunt (maternal grandmother's sister) who died from [place] in her 62s or 30s. Her maternal grandfather died at age 61 and did not have cancer.  Lisa Golden father is currently living at age 22 and has not had cancer. She has two paternal uncles. One of her uncles died from brain cancer that was diagnosed in his early 69s. Her paternal grandmother died in her late 60s/early 26s and her paternal grandfather died at age 44 due to heart problems. There are no other known diagnoses on the paternal side of the family.  Lisa Golden is unaware of previous family history of genetic testing for hereditary cancer risks. She is unsure of her ancestry. There is no reported Ashkenazi Jewish ancestry. There is no known consanguinity.  GENETIC COUNSELING ASSESSMENT: Lisa Golden is a 44 y.o. female with a family history of breast cancer which is not strongly suggestive of a hereditary cancer syndrome. We, therefore, discussed and recommended the following at today's visit.   DISCUSSION:  We discussed that most cancer is sporadic, meaning it happens by chance or due to the existence of additional risk factors.  Approximately 5-10% of cancer is hereditary, wherein a single genetic cause can be identified and tracked through a family.  Most cases of hereditary breast cancer are associated with the BRCA genes, although there are other genes that can increase the risk for breast cancer.  We discussed that, for families with patterns of cancer suggestive of a hereditary cause, testing can be beneficial for several reasons.  These reasons include knowing about other cancer risks, identifying potential screening and risk-reduction options that may be appropriate, and to understand if other family members could be at risk for cancer and allow them to undergo genetic testing.   Some families have more cancers than  would be expected by chance; however, the ages or types of cancer are not consistent with a known genetic mutation or known genetic mutations have been ruled out. This type of familial cancer is thought to be due to a combination of multiple genetic, environmental, hormonal, and lifestyle factors. While this combination of factors likely increases the risk of cancer, the exact source of this risk is not currently identifiable or testable.  We discussed with Lisa Golden that the family history does not meet insurance or NCCN criteria for genetic testing and, therefore, is not highly consistent with a hereditary cancer syndrome.  We feel she is at low risk to harbor a gene mutation associated with such a  condition. Thus, we did not recommend any genetic testing, at this time, and recommended Lisa Golden continue to follow the cancer screening guidelines given by her primary healthcare provider.  Based on the Lisa Golden's personal and family history, a statistical model Midwife) was used to estimate her risk of developing breast cancer. This estimates her lifetime risk of developing breast to be approximately 23.8%. This estimation does not consider any genetic testing results.  The patient's lifetime breast cancer risk is a preliminary estimate based on available information using one of several models endorsed by the [place] (ACS). The ACS recommends consideration of breast MRI screening as an adjunct to mammography for patients at high risk (defined as 20% or greater lifetime risk).   Lisa Golden has been determined to be at high risk for breast cancer.  Therefore, we recommend that annual screening with mammography and breast MRI be performed.  We also discussed that it is reasonable for her to be followed by a high-risk breast cancer clinic.  She would like some time to think more about whether she would like to be referred to this clinic.  In the meantime, Lisa Golden should discuss her  individual situation with her referring physician and determine a breast cancer screening plan with which they are both comfortable.      PLAN:  Lisa Golden did not wish to pursue genetic testing at today's visit. We understand this decision and remain available to coordinate genetic testing at any time in the future. We, therefore, recommend Lisa Golden continue to follow the cancer screening guidelines given by her primary healthcare provider.  Lastly, we encouraged Lisa Golden to remain in contact with cancer genetics annually so that we can continuously update the family history and inform her of any changes in cancer genetics and testing that may be of benefit for this family.   Lisa Golden questions were answered to her satisfaction today. Our contact information was provided should additional questions or concerns arise. Thank you for the referral and allowing [place] to share in the care of your patient.   Clint Guy, MS, [facility] Certified Genetic Counselor [email or web address] Phone: [phone]  The patient was seen for a total of 30 minutes in face-to-face genetic counseling.  This patient was discussed with Drs. Magrinat, Lindi Adie and/or Burr Medico who agrees with the above.    _______________________________________________________________________ For Office Staff:  Number of people involved in session: 1 Was an Intern/ student involved with case: no

## 2019-05-23 ENCOUNTER — Other Ambulatory Visit: Payer: Self-pay | Admitting: Internal Medicine

## 2019-06-05 ENCOUNTER — Encounter (HOSPITAL_BASED_OUTPATIENT_CLINIC_OR_DEPARTMENT_OTHER): Payer: Self-pay | Admitting: Surgery

## 2019-06-05 ENCOUNTER — Other Ambulatory Visit: Payer: Self-pay

## 2019-06-09 ENCOUNTER — Encounter (HOSPITAL_BASED_OUTPATIENT_CLINIC_OR_DEPARTMENT_OTHER)
Admission: RE | Admit: 2019-06-09 | Discharge: 2019-06-09 | Disposition: A | Discharge status: Home / Self Care | Payer: 59 | Source: Ambulatory Visit | Attending: Surgery | Admitting: Surgery

## 2019-06-09 ENCOUNTER — Other Ambulatory Visit: Payer: Self-pay

## 2019-06-09 ENCOUNTER — Other Ambulatory Visit (HOSPITAL_COMMUNITY): Payer: 59

## 2019-06-09 DIAGNOSIS — Z8249 Family history of ischemic heart disease and other diseases of the circulatory system: Secondary | ICD-10-CM | POA: Diagnosis not present

## 2019-06-09 DIAGNOSIS — Z833 Family history of diabetes mellitus: Secondary | ICD-10-CM | POA: Diagnosis not present

## 2019-06-09 DIAGNOSIS — D242 Benign neoplasm of left breast: Secondary | ICD-10-CM | POA: Diagnosis not present

## 2019-06-09 DIAGNOSIS — Z820 Family history of epilepsy and other diseases of the nervous system: Secondary | ICD-10-CM | POA: Diagnosis not present

## 2019-06-09 DIAGNOSIS — F419 Anxiety disorder, unspecified: Secondary | ICD-10-CM | POA: Diagnosis not present

## 2019-06-09 DIAGNOSIS — Z803 Family history of malignant neoplasm of breast: Secondary | ICD-10-CM | POA: Diagnosis not present

## 2019-06-09 DIAGNOSIS — F329 Major depressive disorder, single episode, unspecified: Secondary | ICD-10-CM | POA: Diagnosis not present

## 2019-06-09 DIAGNOSIS — Z01818 Encounter for other preprocedural examination: Secondary | ICD-10-CM | POA: Insufficient documentation

## 2019-06-09 DIAGNOSIS — E7800 Pure hypercholesterolemia, unspecified: Secondary | ICD-10-CM | POA: Diagnosis not present

## 2019-06-09 DIAGNOSIS — Z79899 Other long term (current) drug therapy: Secondary | ICD-10-CM | POA: Diagnosis not present

## 2019-06-09 DIAGNOSIS — L905 Scar conditions and fibrosis of skin: Secondary | ICD-10-CM | POA: Diagnosis present

## 2019-06-09 LAB — CBC WITH DIFFERENTIAL/PLATELET
Abs Immature Granulocytes: 0.02 10*3/uL (ref 0.00–0.07)
Basophils Absolute: 0 10*3/uL (ref 0.0–0.1)
Basophils Relative: 1 %
Eosinophils Absolute: 0 10*3/uL (ref 0.0–0.5)
Eosinophils Relative: 1 %
HCT: 44.4 % (ref 36.0–46.0)
Hemoglobin: 14.3 g/dL (ref 12.0–15.0)
Immature Granulocytes: 0 %
Lymphocytes Relative: 29 %
Lymphs Abs: 2.3 10*3/uL (ref 0.7–4.0)
MCH: 30.1 pg (ref 26.0–34.0)
MCHC: 32.2 g/dL (ref 30.0–36.0)
MCV: 93.5 fL (ref 80.0–100.0)
Monocytes Absolute: 0.8 10*3/uL (ref 0.1–1.0)
Monocytes Relative: 10 %
Neutro Abs: 4.8 10*3/uL (ref 1.7–7.7)
Neutrophils Relative %: 59 %
Platelets: 340 10*3/uL (ref 150–400)
RBC: 4.75 MIL/uL (ref 3.87–5.11)
RDW: 12.6 % (ref 11.5–15.5)
WBC: 7.9 10*3/uL (ref 4.0–10.5)
nRBC: 0 % (ref 0.0–0.2)

## 2019-06-09 LAB — COMPREHENSIVE METABOLIC PANEL
ALT: 15 U/L (ref 0–44)
AST: 20 U/L (ref 15–41)
Albumin: 4.4 g/dL (ref 3.5–5.0)
Alkaline Phosphatase: 56 U/L (ref 38–126)
Anion gap: 13 (ref 5–15)
BUN: 8 mg/dL (ref 6–20)
CO2: 20 mmol/L — ABNORMAL LOW (ref 22–32)
Calcium: 9.4 mg/dL (ref 8.9–10.3)
Chloride: 103 mmol/L (ref 98–111)
Creatinine, Ser: 0.73 mg/dL (ref 0.44–1.00)
GFR calc Af Amer: >60 mL/min (ref >60)
GFR calc non Af Amer: >60 mL/min (ref >60)
Glucose, Bld: 119 mg/dL — ABNORMAL HIGH (ref 70–99)
Potassium: 4 mmol/L (ref 3.5–5.1)
Sodium: 136 mmol/L (ref 135–145)
Total Bilirubin: 0.9 mg/dL (ref 0.3–1.2)
Total Protein: 7.4 g/dL (ref 6.5–8.1)

## 2019-06-09 LAB — POCT PREGNANCY, URINE: Preg Test, Ur: NEGATIVE

## 2019-06-09 NOTE — Progress Notes (Signed)

## 2019-06-11 ENCOUNTER — Ambulatory Visit
Admission: RE | Admit: 2019-06-11 | Discharge: 2019-06-11 | Disposition: A | Discharge status: Home / Self Care | Payer: 59 | Source: Ambulatory Visit | Attending: Surgery | Admitting: Surgery

## 2019-06-11 ENCOUNTER — Other Ambulatory Visit: Payer: Self-pay

## 2019-06-11 DIAGNOSIS — N6489 Other specified disorders of breast: Secondary | ICD-10-CM

## 2019-06-11 IMAGING — MG MM PLC BREAST LOC DEV 1ST LESION INC MAMMO GUIDE*L*
8 of 9 series · 8 of 9 positions shown · non-contrast
Comparison: Previous exam(s).

CLINICAL DATA: 43-year-old with a biopsy-proven high risk complex
sclerosing lesion and intraductal papilloma involving the LOWER
OUTER subareolar LEFT breast. Radioactive seed localization is
performed in anticipation of excisional biopsy which is scheduled
for tomorrow.

EXAM:
MAMMOGRAPHIC GUIDED RADIOACTIVE SEED LOCALIZATION OF THE LEFT BREAST

[L CC (1 of 4)]
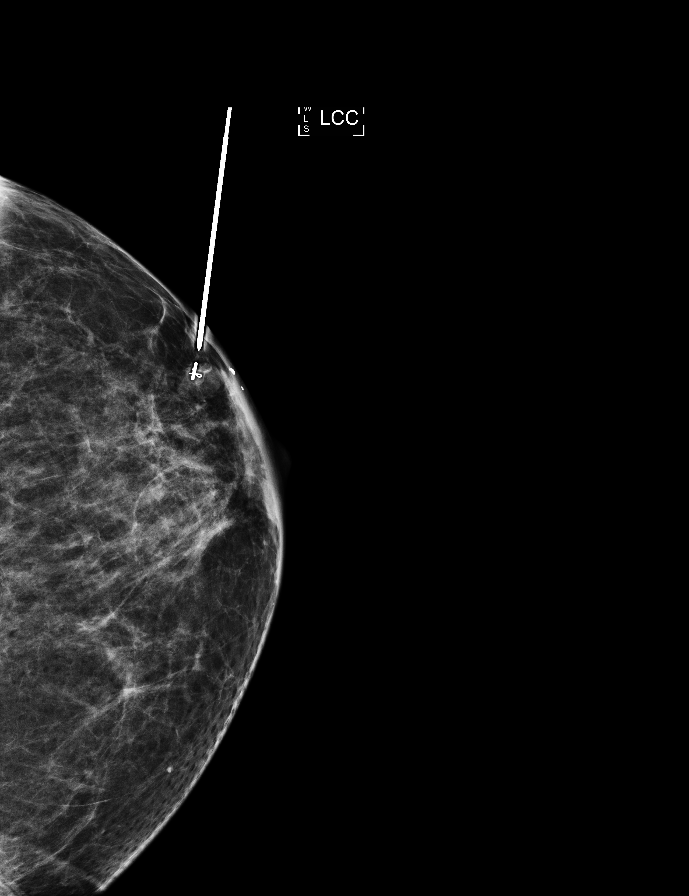

[L LM (1 of 4)]
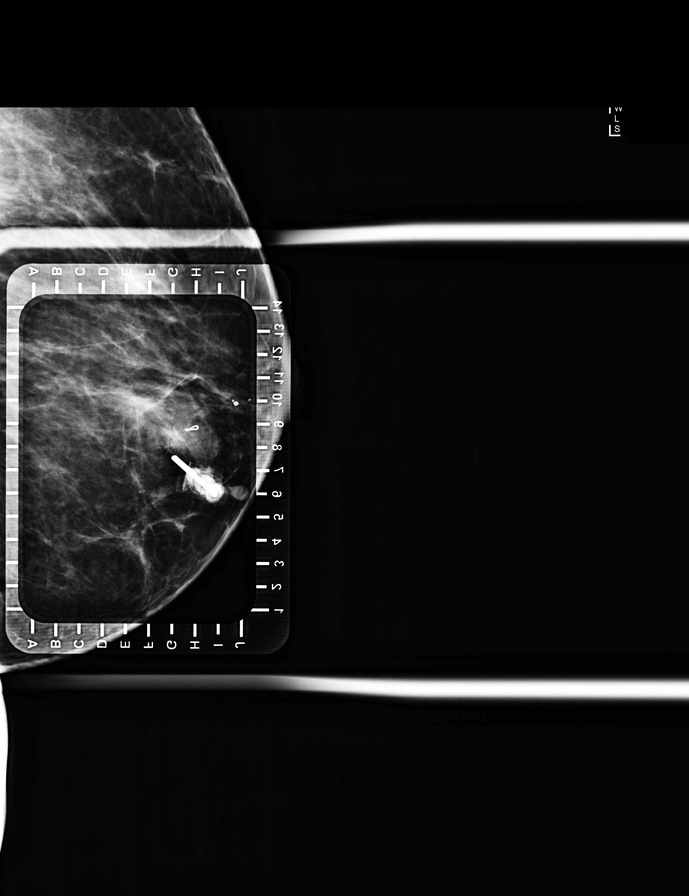

[L CC (2 of 4)]
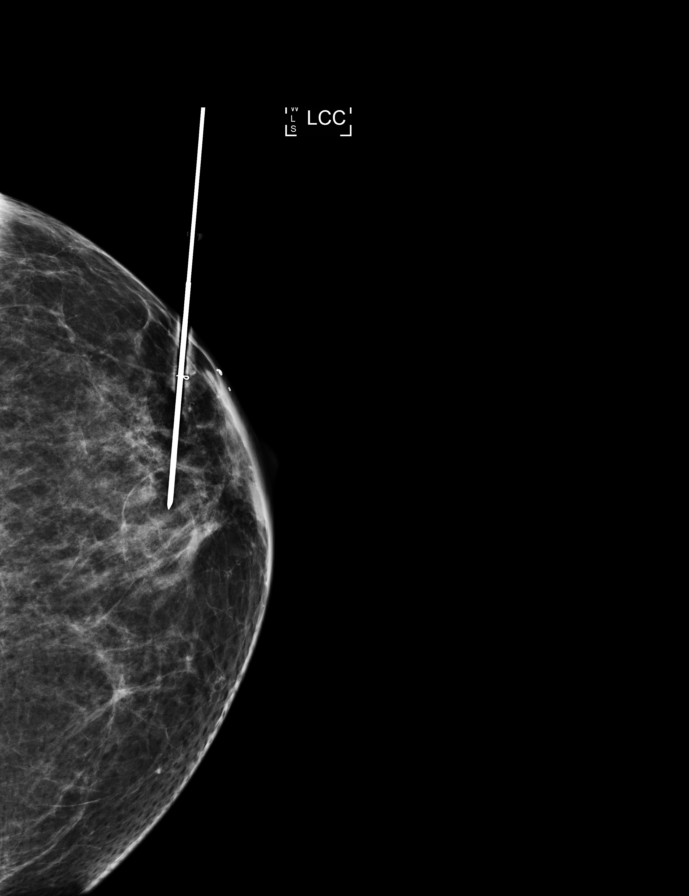

[L CC (3 of 4)]
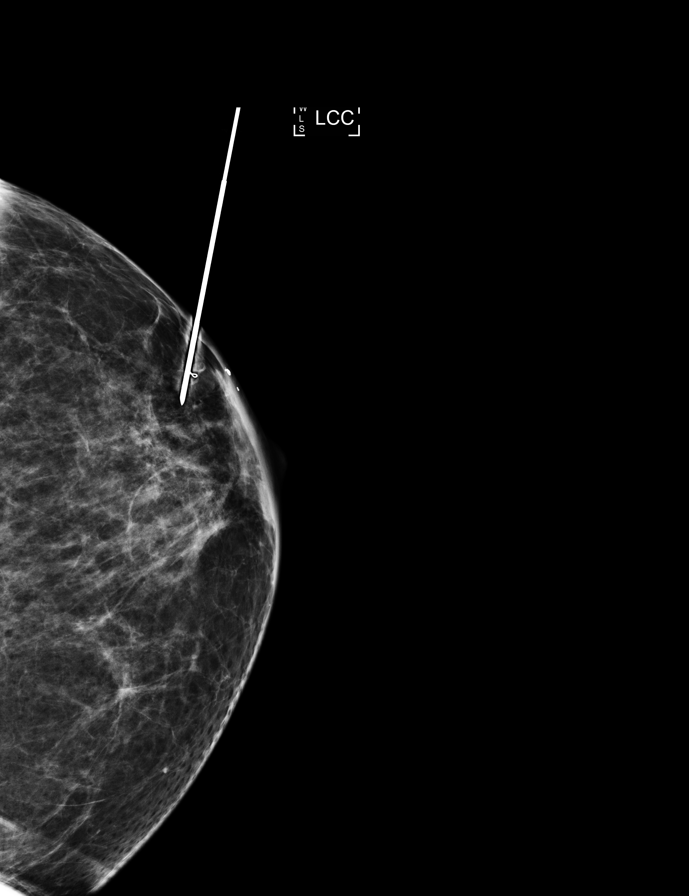

[L LM (2 of 4)]
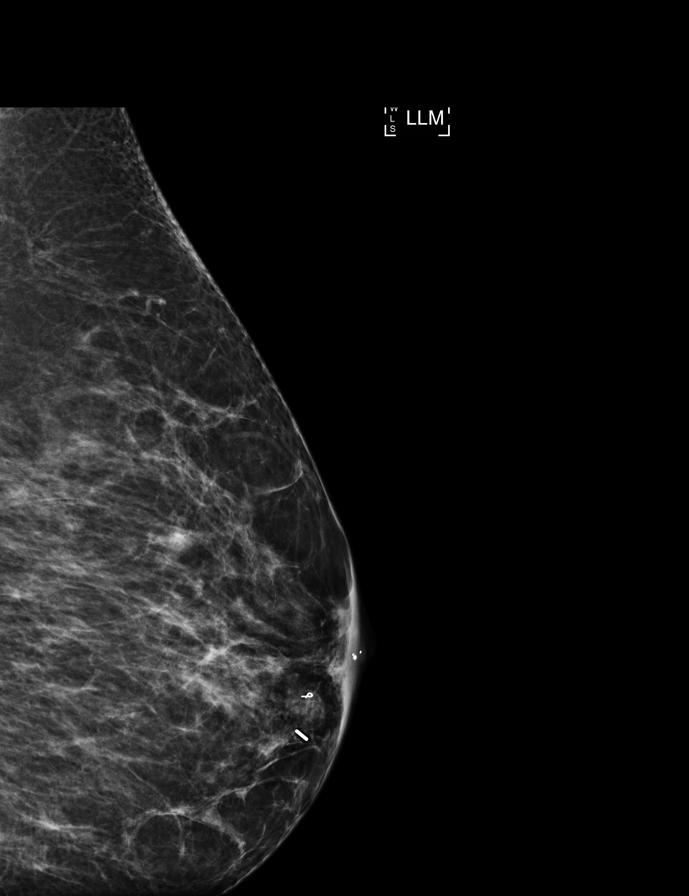

[L LM (3 of 4)]
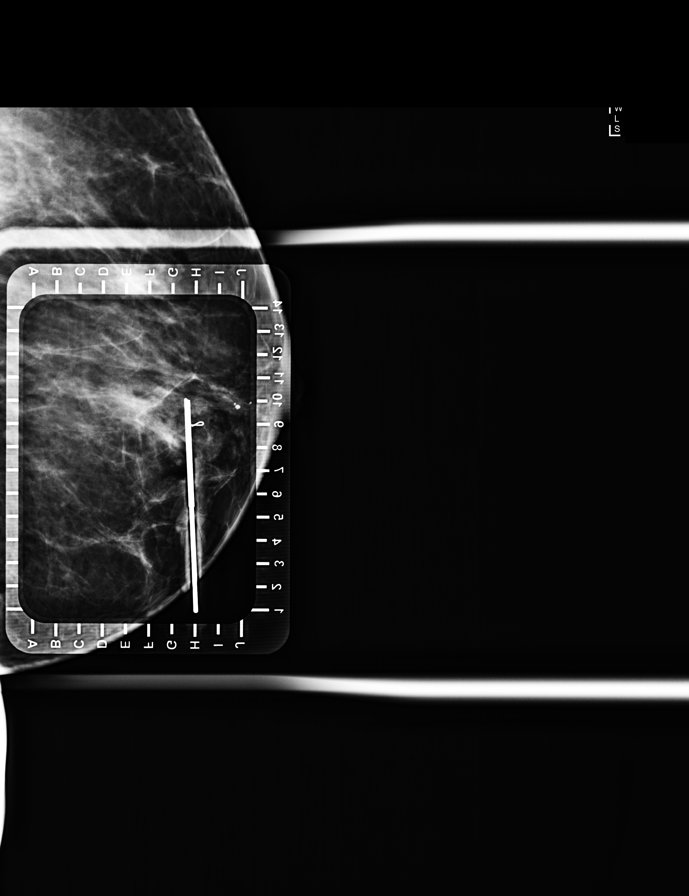

[L LM (4 of 4)]
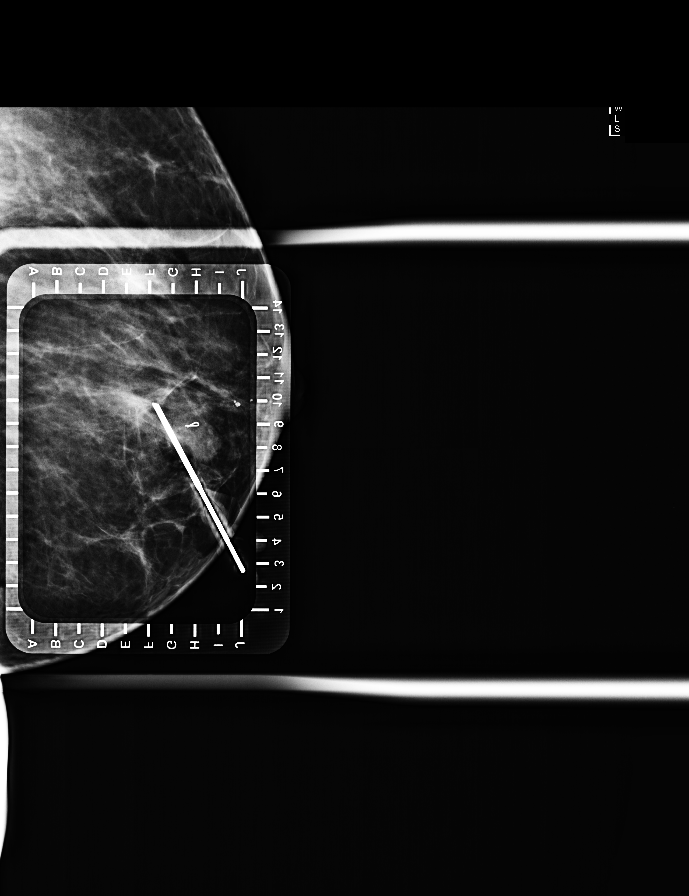

[L CC (4 of 4)]
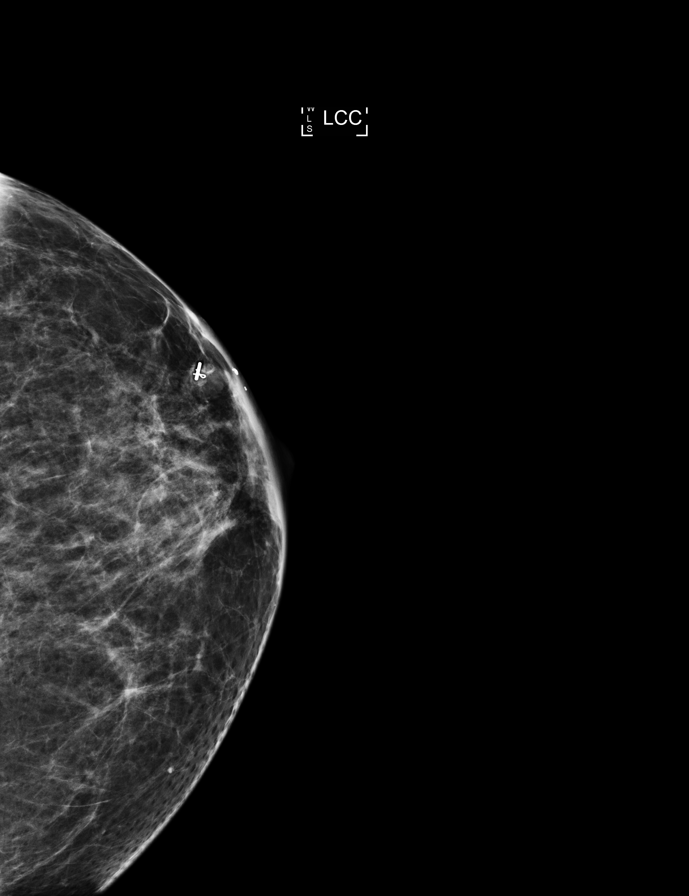

[8 of 9 positions shown; findings below may reference images not displayed]

FINDINGS: Patient presents for radioactive seed localization prior to
excisional biopsy of the high risk complex sclerosing lesion and
intraductal papilloma from the LEFT breast. I met with the patient
and we discussed the procedure of seed localization including
benefits and alternatives. We discussed the high likelihood of a
successful procedure. We discussed the risks of the procedure
including infection, bleeding, tissue injury and further surgery. We
discussed the low dose of radioactivity involved in the procedure.
Informed, written consent was given.

The usual time-out protocol was performed immediately prior to the
procedure.

Using mammographic guidance, sterile technique with chlorhexidine as
skin antisepsis, 1% lidocaine as local anesthetic, an [1O]
radioactive seed was used to localize the mass associated with the
ribbon shaped tissue marker clip in the LOWER OUTER subareolar LEFT
breast using a lateral approach.

The follow-up mammogram images confirm the seed is in the expected
location at the INFERIOR margin of the mass, approximately 7 mm
below the ribbon shaped tissue marker clip which is at the SUPERIOR
margin of the mass. The images are marked for Dr. GIFORIYO.

Follow-up survey of the patient confirms the presence of the
radioactive seed.

Order number of [1O] seed: [PHONE_NUMBER]

Total activity: 0.243 mCi

Reference Date: [DATE]

The patient tolerated the procedure well and was released from the
[REDACTED]. She was given instructions regarding seed removal.
IMPRESSION: Radioactive seed localization of the high risk complex sclerosing
lesion and intraductal papilloma involving the LOWER OUTER
subareolar LEFT breast. No apparent complications.

## 2019-06-12 ENCOUNTER — Encounter (HOSPITAL_BASED_OUTPATIENT_CLINIC_OR_DEPARTMENT_OTHER): Payer: Self-pay | Admitting: Surgery

## 2019-06-12 ENCOUNTER — Encounter (HOSPITAL_BASED_OUTPATIENT_CLINIC_OR_DEPARTMENT_OTHER)
Admission: RE | Disposition: A | Discharge status: Home / Self Care | Payer: Self-pay | Source: Home / Self Care | Attending: Surgery

## 2019-06-12 ENCOUNTER — Other Ambulatory Visit: Payer: Self-pay

## 2019-06-12 ENCOUNTER — Ambulatory Visit
Admission: RE | Admit: 2019-06-12 | Discharge: 2019-06-12 | Disposition: A | Discharge status: Home / Self Care | Payer: 59 | Source: Ambulatory Visit | Attending: Surgery | Admitting: Surgery

## 2019-06-12 ENCOUNTER — Ambulatory Visit (HOSPITAL_BASED_OUTPATIENT_CLINIC_OR_DEPARTMENT_OTHER): Payer: 59 | Admitting: Anesthesiology

## 2019-06-12 ENCOUNTER — Ambulatory Visit (HOSPITAL_BASED_OUTPATIENT_CLINIC_OR_DEPARTMENT_OTHER)
Admission: RE | Admit: 2019-06-12 | Discharge: 2019-06-12 | Disposition: A | Discharge status: Home / Self Care | Payer: 59 | Attending: Surgery | Admitting: Surgery

## 2019-06-12 DIAGNOSIS — D242 Benign neoplasm of left breast: Secondary | ICD-10-CM | POA: Diagnosis not present

## 2019-06-12 DIAGNOSIS — Z8249 Family history of ischemic heart disease and other diseases of the circulatory system: Secondary | ICD-10-CM | POA: Insufficient documentation

## 2019-06-12 DIAGNOSIS — N6489 Other specified disorders of breast: Secondary | ICD-10-CM

## 2019-06-12 DIAGNOSIS — Z820 Family history of epilepsy and other diseases of the nervous system: Secondary | ICD-10-CM | POA: Insufficient documentation

## 2019-06-12 DIAGNOSIS — Z803 Family history of malignant neoplasm of breast: Secondary | ICD-10-CM | POA: Insufficient documentation

## 2019-06-12 DIAGNOSIS — Z833 Family history of diabetes mellitus: Secondary | ICD-10-CM | POA: Insufficient documentation

## 2019-06-12 DIAGNOSIS — F329 Major depressive disorder, single episode, unspecified: Secondary | ICD-10-CM | POA: Insufficient documentation

## 2019-06-12 DIAGNOSIS — F419 Anxiety disorder, unspecified: Secondary | ICD-10-CM | POA: Insufficient documentation

## 2019-06-12 DIAGNOSIS — E7800 Pure hypercholesterolemia, unspecified: Secondary | ICD-10-CM | POA: Insufficient documentation

## 2019-06-12 DIAGNOSIS — Z79899 Other long term (current) drug therapy: Secondary | ICD-10-CM | POA: Insufficient documentation

## 2019-06-12 HISTORY — PX: BREAST LUMPECTOMY WITH RADIOACTIVE SEED LOCALIZATION: SHX6424

## 2019-06-12 IMAGING — DX MM BREAST SURGICAL SPECIMEN
1 series · 2 of 2 positions shown · non-contrast
Comparison: Previous exam(s).

CLINICAL DATA: Status post seed localized lumpectomy LEFT breast.

EXAM:
SPECIMEN RADIOGRAPH OF THE LEFT BREAST

[Series 1: specimen digital x-ray · left · 0.10mm/px · 2 of 2 slices shown]
[im 1/2]
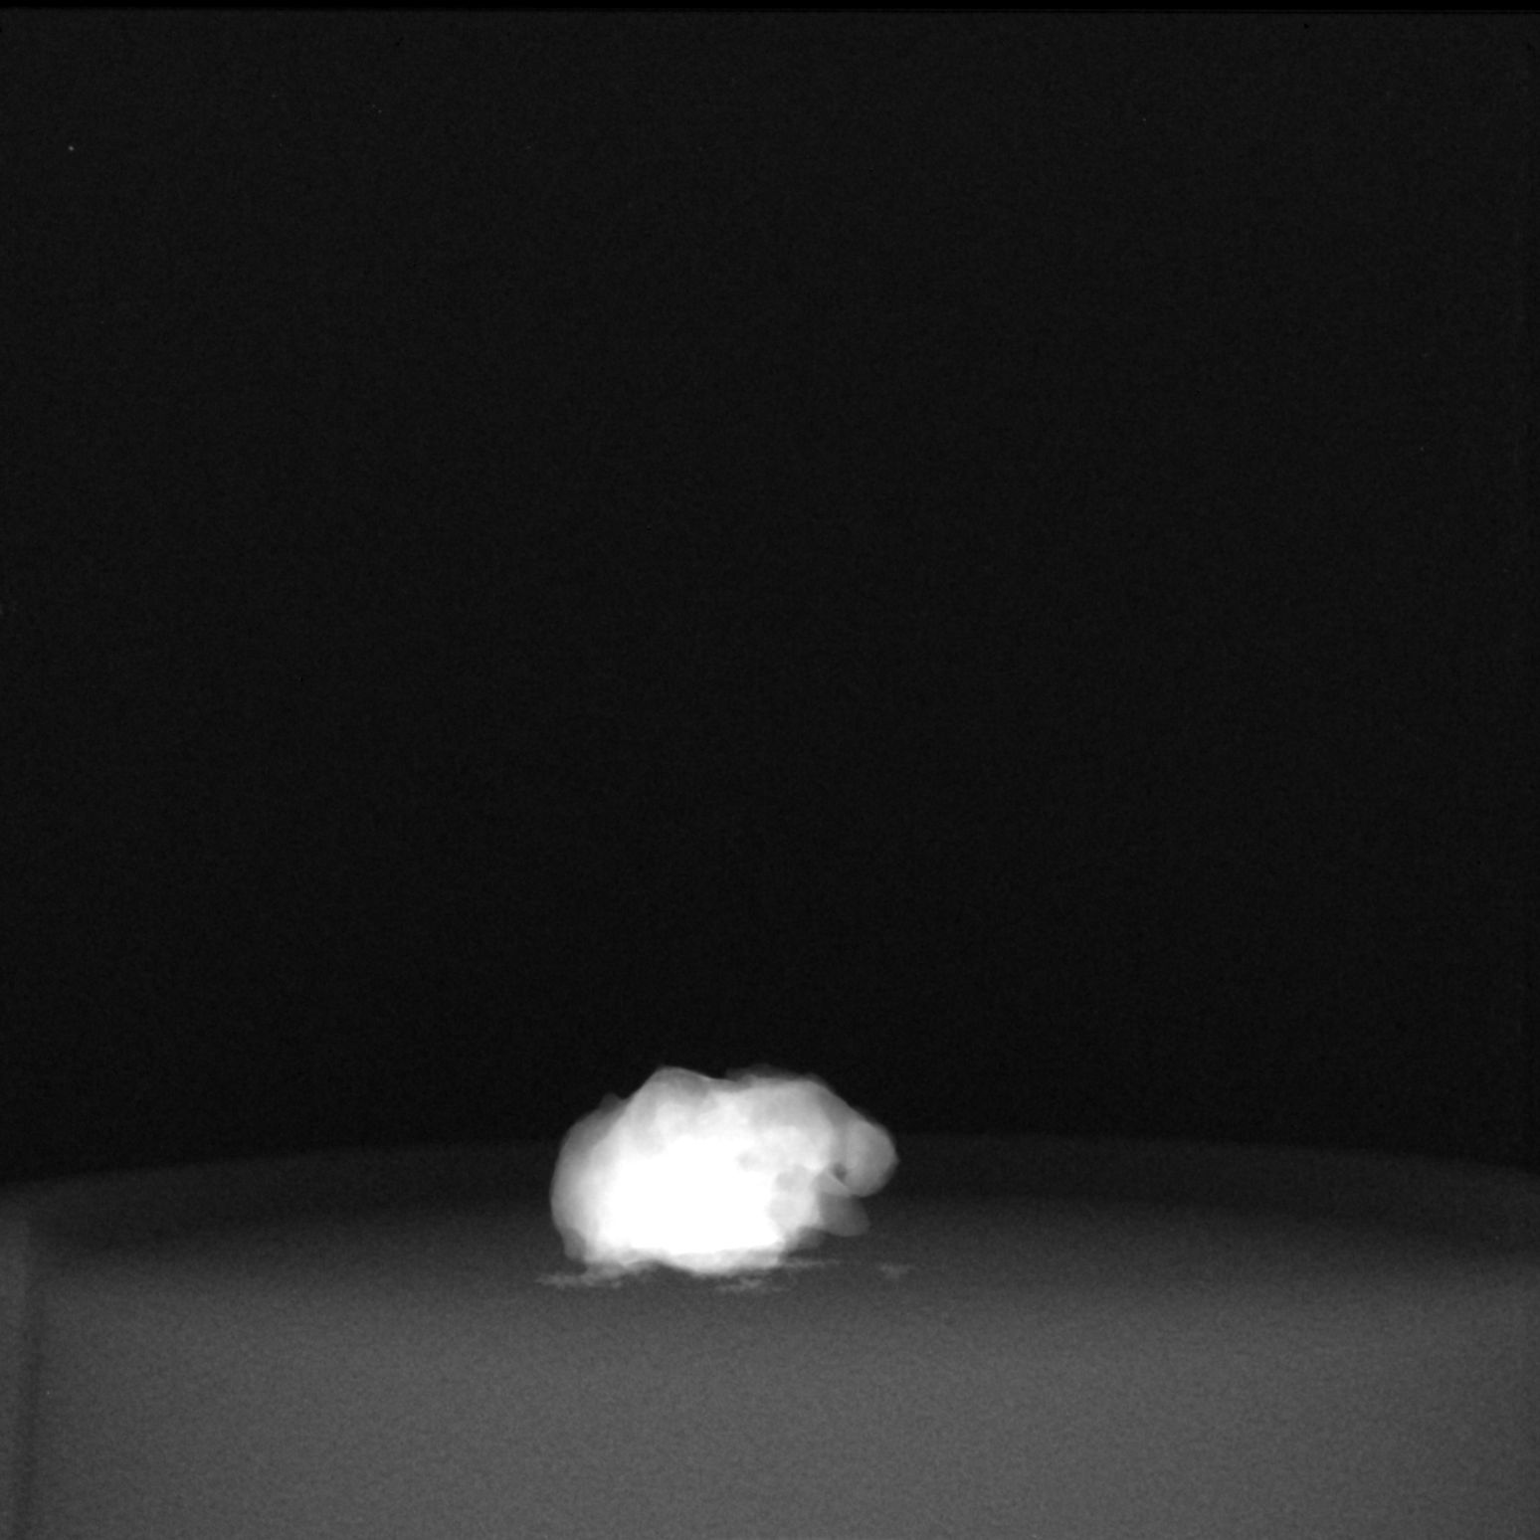
[im 2/2]
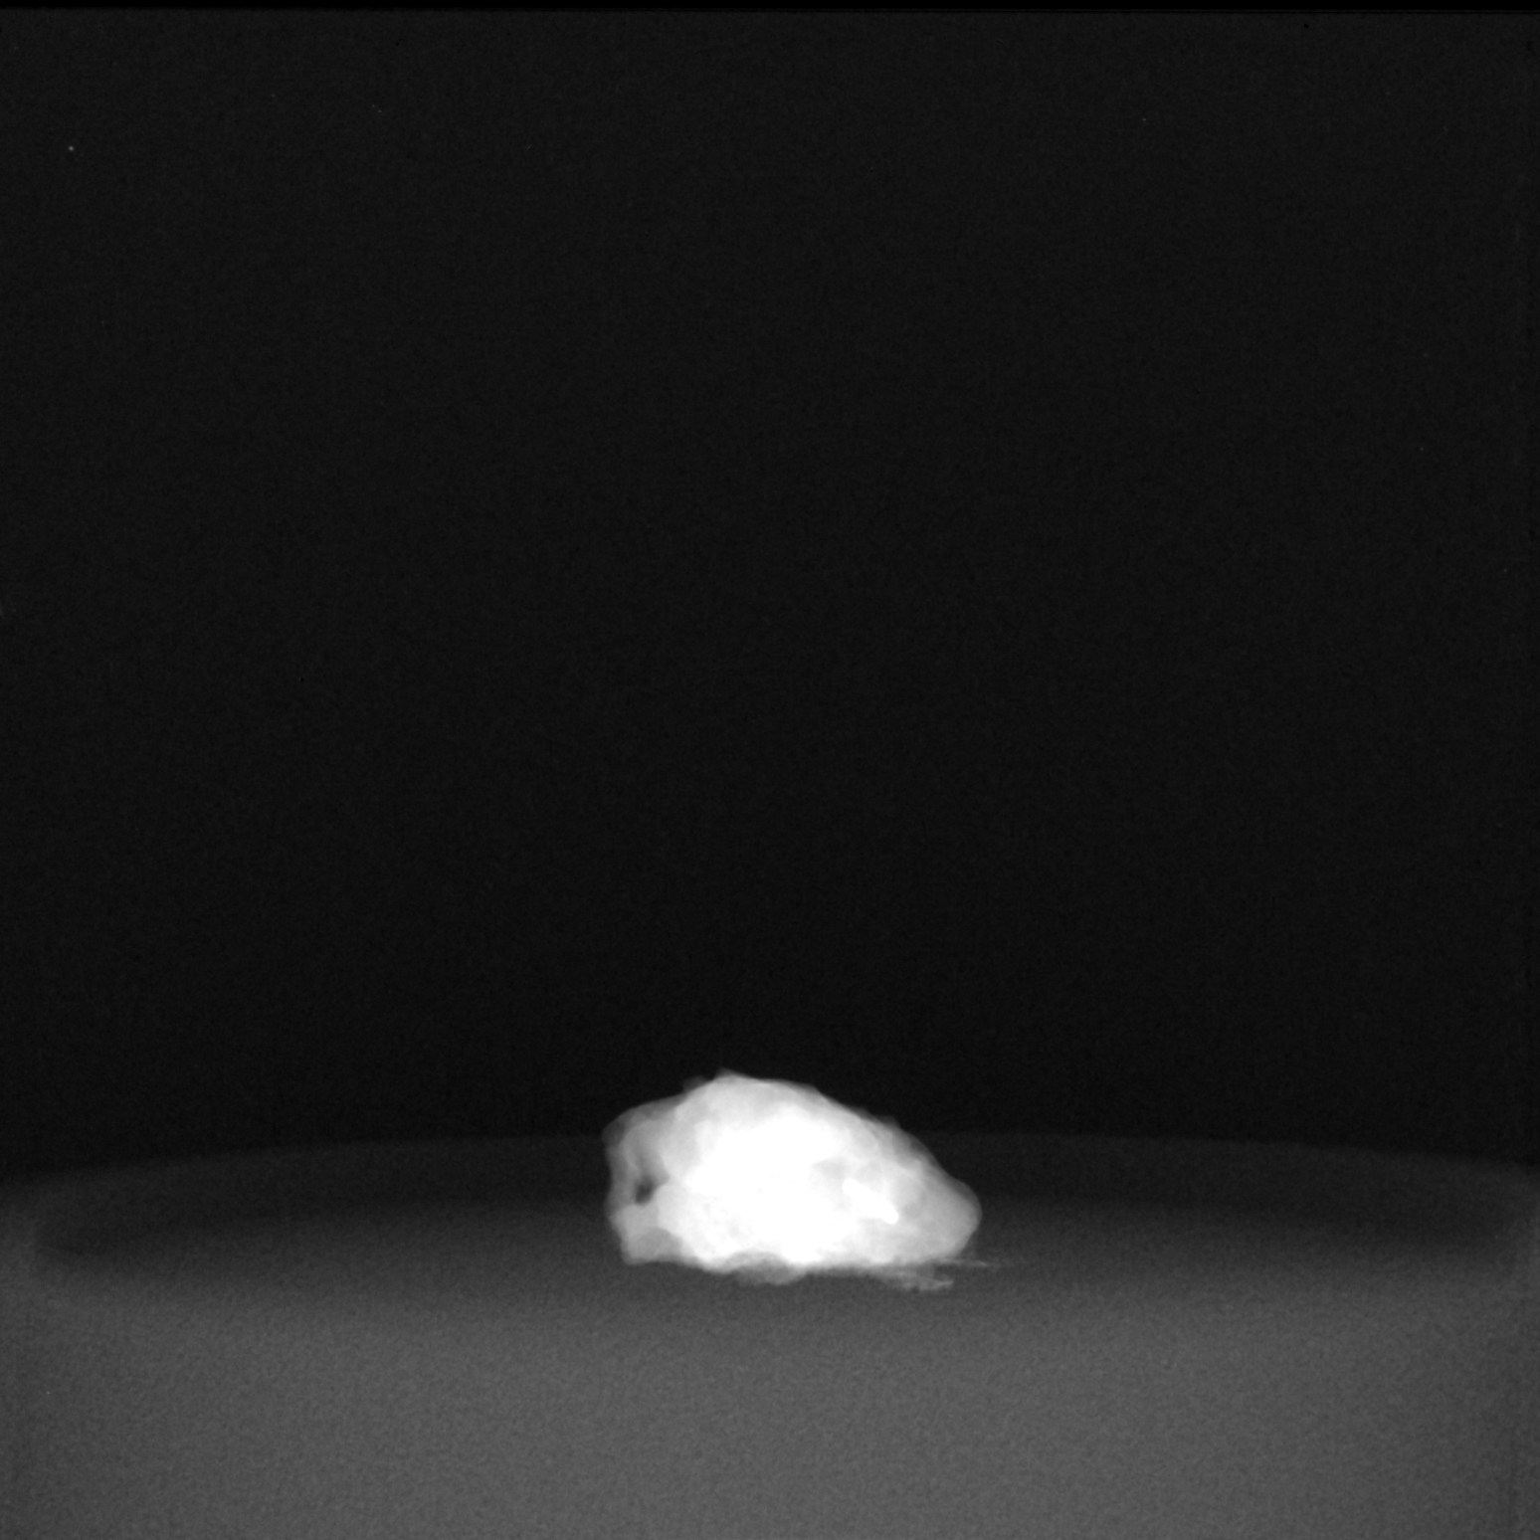

[2 of 2 positions shown; findings below may reference images not displayed]

FINDINGS: Status post excision of the left breast. The radioactive seed and
ribbon shaped biopsy marker clip are present, completely intact, and
were marked for pathology.
IMPRESSION: Specimen radiograph of the left breast.

## 2019-06-12 SURGERY — BREAST LUMPECTOMY WITH RADIOACTIVE SEED LOCALIZATION
Anesthesia: General | Site: Breast | Laterality: Left

## 2019-06-12 MED ORDER — DEXAMETHASONE SODIUM PHOSPHATE 4 MG/ML IJ SOLN
INTRAMUSCULAR | Status: DC | PRN
  Administered 2019-06-12: 10 mg via INTRAVENOUS

## 2019-06-12 MED ORDER — LACTATED RINGERS IV SOLN: INTRAVENOUS | Status: DC

## 2019-06-12 MED ORDER — CHLORHEXIDINE GLUCONATE CLOTH 2 % EX PADS: 6.0000 | MEDICATED_PAD | Freq: Once | CUTANEOUS | Status: DC

## 2019-06-12 MED ORDER — CEFAZOLIN SODIUM-DEXTROSE 2-4 GM/100ML-% IV SOLN
INTRAVENOUS | Status: AC
  Filled 2019-06-12: qty 100

## 2019-06-12 MED ORDER — ACETAMINOPHEN 325 MG PO TABS: 325.0000 mg | ORAL_TABLET | ORAL | Status: DC | PRN

## 2019-06-12 MED ORDER — OXYCODONE HCL 5 MG PO TABS
ORAL_TABLET | ORAL | Status: AC
  Filled 2019-06-12: qty 1

## 2019-06-12 MED ORDER — BUPIVACAINE HCL (PF) 0.25 % IJ SOLN
INTRAMUSCULAR | Status: DC | PRN
  Administered 2019-06-12: 13 mL

## 2019-06-12 MED ORDER — EPHEDRINE 5 MG/ML INJ
INTRAVENOUS | Status: AC
  Filled 2019-06-12: qty 10

## 2019-06-12 MED ORDER — CELECOXIB 200 MG PO CAPS
200.0000 mg | ORAL_CAPSULE | ORAL | Status: AC
  Administered 2019-06-12: 200 mg via ORAL

## 2019-06-12 MED ORDER — PROPOFOL 10 MG/ML IV BOLUS
INTRAVENOUS | Status: AC
  Filled 2019-06-12: qty 20

## 2019-06-12 MED ORDER — MIDAZOLAM HCL 2 MG/2ML IJ SOLN
INTRAMUSCULAR | Status: AC
  Filled 2019-06-12: qty 2

## 2019-06-12 MED ORDER — FENTANYL CITRATE (PF) 100 MCG/2ML IJ SOLN
INTRAMUSCULAR | Status: DC | PRN
  Administered 2019-06-12: 100 ug via INTRAVENOUS

## 2019-06-12 MED ORDER — LIDOCAINE 2% (20 MG/ML) 5 ML SYRINGE
INTRAMUSCULAR | Status: DC | PRN
  Administered 2019-06-12: 75 mg via INTRAVENOUS

## 2019-06-12 MED ORDER — CELECOXIB 200 MG PO CAPS
ORAL_CAPSULE | ORAL | Status: AC
  Filled 2019-06-12: qty 1

## 2019-06-12 MED ORDER — ACETAMINOPHEN 500 MG PO TABS
1000.0000 mg | ORAL_TABLET | ORAL | Status: AC
  Administered 2019-06-12: 1000 mg via ORAL

## 2019-06-12 MED ORDER — GABAPENTIN 300 MG PO CAPS
300.0000 mg | ORAL_CAPSULE | ORAL | Status: AC
  Administered 2019-06-12: 12:00:00 300 mg via ORAL

## 2019-06-12 MED ORDER — FENTANYL CITRATE (PF) 100 MCG/2ML IJ SOLN
INTRAMUSCULAR | Status: AC
  Filled 2019-06-12: qty 2

## 2019-06-12 MED ORDER — IBUPROFEN 800 MG PO TABS: 800.0000 mg | ORAL_TABLET | Freq: Three times a day (TID) | ORAL | 0 refills | Status: DC | PRN

## 2019-06-12 MED ORDER — CEFAZOLIN SODIUM-DEXTROSE 2-4 GM/100ML-% IV SOLN
2.0000 g | INTRAVENOUS | Status: AC
  Administered 2019-06-12: 2 g via INTRAVENOUS

## 2019-06-12 MED ORDER — MEPERIDINE HCL 25 MG/ML IJ SOLN: 6.2500 mg | INTRAMUSCULAR | Status: DC | PRN

## 2019-06-12 MED ORDER — FENTANYL CITRATE (PF) 100 MCG/2ML IJ SOLN
25.0000 ug | INTRAMUSCULAR | Status: DC | PRN
  Administered 2019-06-12: 25 ug via INTRAVENOUS

## 2019-06-12 MED ORDER — ONDANSETRON HCL 4 MG/2ML IJ SOLN: 4.0000 mg | Freq: Once | INTRAMUSCULAR | Status: DC | PRN

## 2019-06-12 MED ORDER — ONDANSETRON HCL 4 MG/2ML IJ SOLN
INTRAMUSCULAR | Status: DC | PRN
  Administered 2019-06-12: 4 mg via INTRAVENOUS

## 2019-06-12 MED ORDER — MIDAZOLAM HCL 5 MG/5ML IJ SOLN
INTRAMUSCULAR | Status: DC | PRN
  Administered 2019-06-12: 2 mg via INTRAVENOUS

## 2019-06-12 MED ORDER — ACETAMINOPHEN 500 MG PO TABS
ORAL_TABLET | ORAL | Status: AC
  Filled 2019-06-12: qty 2

## 2019-06-12 MED ORDER — PROPOFOL 10 MG/ML IV BOLUS
INTRAVENOUS | Status: DC | PRN
  Administered 2019-06-12: 150 mg via INTRAVENOUS
  Administered 2019-06-12: 20 mg via INTRAVENOUS

## 2019-06-12 MED ORDER — GABAPENTIN 300 MG PO CAPS
ORAL_CAPSULE | ORAL | Status: AC
  Filled 2019-06-12: qty 1

## 2019-06-12 MED ORDER — OXYCODONE HCL 5 MG PO TABS
5.0000 mg | ORAL_TABLET | Freq: Once | ORAL | Status: AC | PRN
  Administered 2019-06-12: 5 mg via ORAL

## 2019-06-12 MED ORDER — HYDROCODONE-ACETAMINOPHEN 5-325 MG PO TABS: 1.0000 | ORAL_TABLET | Freq: Four times a day (QID) | ORAL | 0 refills | Status: DC | PRN

## 2019-06-12 MED ORDER — ACETAMINOPHEN 160 MG/5ML PO SOLN: 325.0000 mg | ORAL | Status: DC | PRN

## 2019-06-12 MED ORDER — OXYCODONE HCL 5 MG/5ML PO SOLN: 5.0000 mg | Freq: Once | ORAL | Status: AC | PRN

## 2019-06-12 SURGICAL SUPPLY — 47 items
APL PRP STRL LF DISP 70% ISPRP (MISCELLANEOUS) ×1
APPLIER CLIP 9.375 MED OPEN (MISCELLANEOUS)
BINDER BREAST LRG (GAUZE/BANDAGES/DRESSINGS) IMPLANT
BINDER BREAST MEDIUM (GAUZE/BANDAGES/DRESSINGS) IMPLANT
BINDER BREAST XLRG (GAUZE/BANDAGES/DRESSINGS) IMPLANT
BINDER BREAST XXLRG (GAUZE/BANDAGES/DRESSINGS) IMPLANT
BLADE SURG 15 STRL LF DISP TIS (BLADE) ×1 IMPLANT
BLADE SURG 15 STRL SS (BLADE) ×2
CANISTER SUC SOCK COL 7IN (MISCELLANEOUS) IMPLANT
CANISTER SUCT 1200ML W/VALVE (MISCELLANEOUS) IMPLANT
CHLORAPREP W/TINT 26 (MISCELLANEOUS) ×2 IMPLANT
CLIP APPLIE 9.375 MED OPEN (MISCELLANEOUS) IMPLANT
COVER BACK TABLE 60X90IN (DRAPES) ×2 IMPLANT
COVER MAYO STAND STRL (DRAPES) ×2 IMPLANT
COVER PROBE W GEL 5X96 (DRAPES) ×2 IMPLANT
COVER WAND RF STERILE (DRAPES) IMPLANT
DECANTER SPIKE VIAL GLASS SM (MISCELLANEOUS) IMPLANT
DERMABOND ADVANCED (GAUZE/BANDAGES/DRESSINGS) ×1
DERMABOND ADVANCED .7 DNX12 (GAUZE/BANDAGES/DRESSINGS) ×1 IMPLANT
DRAPE LAPAROSCOPIC ABDOMINAL (DRAPES) IMPLANT
DRAPE LAPAROTOMY 100X72 PEDS (DRAPES) ×2 IMPLANT
DRAPE UTILITY XL STRL (DRAPES) ×2 IMPLANT
ELECT COATED BLADE 2.86 ST (ELECTRODE) ×2 IMPLANT
ELECT REM PT RETURN 9FT ADLT (ELECTROSURGICAL) ×2
ELECTRODE REM PT RTRN 9FT ADLT (ELECTROSURGICAL) ×1 IMPLANT
GLOVE BIOGEL PI IND STRL 8 (GLOVE) ×1 IMPLANT
GLOVE BIOGEL PI INDICATOR 8 (GLOVE) ×1
GLOVE ECLIPSE 8.0 STRL XLNG CF (GLOVE) ×2 IMPLANT
GOWN STRL REUS W/ TWL LRG LVL3 (GOWN DISPOSABLE) ×2 IMPLANT
GOWN STRL REUS W/TWL LRG LVL3 (GOWN DISPOSABLE) ×4
HEMOSTAT ARISTA ABSORB 3G PWDR (HEMOSTASIS) IMPLANT
HEMOSTAT SNOW SURGICEL 2X4 (HEMOSTASIS) IMPLANT
KIT MARKER MARGIN INK (KITS) ×2 IMPLANT
NEEDLE HYPO 25X1 1.5 SAFETY (NEEDLE) ×2 IMPLANT
NS IRRIG 1000ML POUR BTL (IV SOLUTION) ×2 IMPLANT
PACK BASIN DAY SURGERY FS (CUSTOM PROCEDURE TRAY) ×2 IMPLANT
PENCIL SMOKE EVACUATOR (MISCELLANEOUS) ×2 IMPLANT
SLEEVE SCD COMPRESS KNEE MED (MISCELLANEOUS) ×2 IMPLANT
SPONGE LAP 4X18 RFD (DISPOSABLE) ×2 IMPLANT
SUT MNCRL AB 4-0 PS2 18 (SUTURE) ×2 IMPLANT
SUT SILK 2 0 SH (SUTURE) IMPLANT
SUT VICRYL 3-0 CR8 SH (SUTURE) ×2 IMPLANT
SYR CONTROL 10ML LL (SYRINGE) ×2 IMPLANT
TOWEL GREEN STERILE FF (TOWEL DISPOSABLE) ×2 IMPLANT
TRAY FAXITRON CT DISP (TRAY / TRAY PROCEDURE) ×2 IMPLANT
TUBE CONNECTING 20X1/4 (TUBING) IMPLANT
YANKAUER SUCT BULB TIP NO VENT (SUCTIONS) IMPLANT

## 2019-06-12 NOTE — Transfer of Care (Signed)
Immediate Anesthesia Transfer of Care Note  Patient: Lisa Golden  Procedure(s) Performed: LEFT BREAST LUMPECTOMY WITH RADIOACTIVE SEED LOCALIZATION (Left Breast)  Patient Location: PACU  Anesthesia Type:General  Level of Consciousness: awake, alert , oriented and patient cooperative  Airway & Oxygen Therapy: Patient Spontanous Breathing and Patient connected to face mask oxygen  Post-op Assessment: Report given to RN and Post -op Vital signs reviewed and stable  Post vital signs: Reviewed and stable  Last Vitals:  Vitals Value Taken Time  BP    Temp    Pulse 91 06/12/19 1319  Resp    SpO2 100 % 06/12/19 1319  Vitals shown include unvalidated device data.  Last Pain:  Vitals:   06/12/19 1135  TempSrc: Axillary  PainSc: 0-No pain      Patients Stated Pain Goal: 7 ([id number])  Complications: No apparent anesthesia complications

## 2019-06-12 NOTE — H&P (Signed)
Bing Matter Documented: 05/12/2019 9:46 AM Location: [facility] Patient #: [id number] DOB: July 17, 1975 Single / Language: Cleophus Molt / Race: White Female  History of Present Illness Marcello Moores A. Temple Sporer MD; 05/12/2019 10:13 AM) Patient words: Patient presents for evaluation of abnormal left mammogram. Patient went screening mammogram and core biopsy which showed radial scar. She denies any history of breast pain, breast mass or nipple discharge. She has 2 first-degree relatives with breast cancer.     Diagnosis Breast, left, needle core biopsy, lower outer quadrant, 4 o'clock, 1cm from nipple - COMPLEX SCLEROSING LESION, SEE COMMENT.   The patient was called back from screening mammography due to a left breast mass.  EXAM: DIGITAL DIAGNOSTIC LEFT MAMMOGRAM WITH TOMO  ULTRASOUND LEFT BREAST  COMPARISON: Previous exam(s).  ACR Breast Density Category b: There are scattered areas of fibroglandular density.  FINDINGS: The left retroareolar breast mass persists on additional imaging and represents a change.  On physical exam, no suspicious lumps are identified.  Targeted ultrasound is performed, showing a solid mass in the left breast at 4 o'clock, 1 cm from the nipple measuring 7 by 9 x 11 mm today. The mass is oval and taller than wide. No posterior shadowing or increased through transmission. There is internal blood flow. No axillary adenopathy.  IMPRESSION: Indeterminate left breast mass at 4 o'clock, 1 cm from the nipple.  RECOMMENDATION: Ultrasound-guided biopsy of the 4 o'clock left breast mass.  I have discussed the findings and recommendations with the patient. If applicable, a reminder letter will be sent to the patient regarding the next appointment.  BI-RADS CATEGORY 4: Suspicious.   Electronically Signed By: Dorise Bullion III M.D On: 04/07/2019 10:07.  The patient is a 44 year old female.   Past Surgical History  (Tanisha A. Owens Shark, [place]; 05/12/2019 9:46 AM) Breast Biopsy Left. Oral Surgery  Diagnostic Studies History (Tanisha A. Owens Shark, [place]; 05/12/2019 9:46 AM) Colonoscopy never Mammogram within last year Pap Smear 1-5 years ago  Allergies (Tanisha A. Owens Shark, [place]; 05/12/2019 9:47 AM) No Known Drug Allergies [05/12/2019]: Allergies Reconciled  Medication History (Tanisha A. Owens Shark, [place]; 05/12/2019 9:48 AM) FLUoxetine HCl (20MG  Tablet, Oral) Active. Vitamin D (Oral) Specific strength unknown - Active. Rosuvastatin Calcium (20MG  Tablet, Oral) Active. ALPRAZolam (0.5MG  Tablet, Oral) Active. Medications Reconciled  Social History (Tanisha A. Owens Shark, [place]; 05/12/2019 9:46 AM) Alcohol use Occasional alcohol use. Caffeine use Coffee. No drug use Tobacco use Never smoker.  Family History (Tanisha A. Owens Shark, [place]; 05/12/2019 9:46 AM) Breast Cancer Family Members In General, Mother. Diabetes Mellitus Father. Heart Disease Father. Migraine Headache Mother. Seizure disorder Son.  Pregnancy / Birth History (Tanisha A. Owens Shark, [place]; 05/12/2019 9:46 AM) Age at menarche 49 years. Contraceptive History Oral contraceptives. Gravida 1 Maternal age 14-30 Para 1 Regular periods  Other Problems (Tanisha A. Owens Shark, [place]; 05/12/2019 9:46 AM) Anxiety Disorder Depression Hypercholesterolemia     Review of Systems (Tanisha A. Brown RMA; 05/12/2019 9:46 AM) General Not Present- Appetite Loss, Chills, Fatigue, Fever, Night Sweats, Weight Gain and Weight Loss. Skin Not Present- Change in Wart/Mole, Dryness, Hives, Jaundice, New Lesions, Non-Healing Wounds, Rash and Ulcer. HEENT Not Present- Earache, Hearing Loss, Hoarseness, Nose Bleed, Oral Ulcers, Ringing in the Ears, Seasonal Allergies, Sinus Pain, Sore Throat, Visual Disturbances, Wears glasses/contact lenses and Yellow Eyes. Respiratory Not Present- Bloody sputum, Chronic Cough, Difficulty Breathing, Snoring and Wheezing. Breast  Present- Breast Mass. Not Present- Breast Pain, Nipple Discharge and Skin Changes. Cardiovascular Not Present- Chest Pain, Difficulty Breathing Lying Down, Leg  Cramps, Palpitations, Rapid Heart Rate, Shortness of Breath and Swelling of Extremities. Gastrointestinal Not Present- Abdominal Pain, Bloating, Bloody Stool, Change in Bowel Habits, Chronic diarrhea, Constipation, Difficulty Swallowing, Excessive gas, Gets full quickly at meals, Hemorrhoids, Indigestion, Nausea, Rectal Pain and Vomiting. Female Genitourinary Not Present- Frequency, Nocturia, Painful Urination, Pelvic Pain and Urgency. Musculoskeletal Not Present- Back Pain, Joint Pain, Joint Stiffness, Muscle Pain, Muscle Weakness and Swelling of Extremities. Psychiatric Not Present- Anxiety, Bipolar, Change in Sleep Pattern, Depression, Fearful and Frequent crying. Endocrine Not Present- Cold Intolerance, Excessive Hunger, Hair Changes, Heat Intolerance, Hot flashes and New Diabetes.   Physical Exam (Kimiyo Carmicheal A. Johanna Matto MD; 05/12/2019 10:13 AM)  General Mental Status-Alert. General Appearance-Consistent with stated age. Hydration-Well hydrated. Voice-Normal.  Chest and Lung Exam Chest and lung exam reveals -quiet, even and easy respiratory effort with no use of accessory muscles and on auscultation, normal breath sounds, no adventitious sounds and normal vocal resonance. Inspection Chest Wall - Normal. Back - normal.  Breast Breast - Left-Symmetric, Non Tender, No Biopsy scars, no Dimpling - Left, No Inflammation, No Lumpectomy scars, No Mastectomy scars, No Peau d' Orange. Breast - Right-Symmetric, Non Tender, No Biopsy scars, no Dimpling - Right, No Inflammation, No Lumpectomy scars, No Mastectomy scars, No Peau d' Orange. Breast Lump-No Palpable Breast Mass.  Cardiovascular Cardiovascular examination reveals -normal heart sounds, regular rate and rhythm with no murmurs and normal pedal pulses  bilaterally.  Lymphatic Head & Neck  General Head & Neck Lymphatics: Bilateral - Description - Normal. Axillary  General Axillary Region: Bilateral - Description - Normal. Tenderness - Non Tender.    Assessment & Plan (Caramia Boutin A. 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 She has 2 first-degree relatives who have been diagnosed with breast cancer as well. Recommend left breast lumpectomy seed localized. Discussed medical management and follow-up with surveillance as well. She has agreed to left breast lumpectomy. Risk of lumpectomy include bleeding, infection, seroma, more surgery, use of seed/wire, wound care, cosmetic deformity and the need for other treatments, death , blood clots, death. Pt agrees to proceed.  Current Plans Pt Education - [facility] Free Text Education/Instructions: discussed with patient and provided information. Pt Education - [facility] Breast Biopsy HCI: discussed with patient and provided information. Pt Education - [facility] - General recommendations Pt Education - [facility] Free Text Education/Instructions: discussed with patient and provided information.  FAMILY HISTORY OF BREAST CANCER IN MOTHER (Z80.3) Impression: Refer for genetic screening given 2 first-degree relatives diagnosed with breast cancer.  TC risks over 10 year 5.4% lifetime risk of 29% Total time spent with patient include 15 minutes face-to-face for physical examination, history and review of systems. 15 minutes for chart review, x-ray review, calculation of genetic risk and review of records as well as documentation for a total time of 30 minutes

## 2019-06-12 NOTE — Op Note (Signed)
Preoperative diagnosis: Left breast radial scar  Postoperative diagnosis: Same  Procedure: Left breast seed localized lumpectomy  Surgeon: Erroll Luna, MD  Anesthesia: General with local consisting of 0.25% Marcaine  EBL: Minimal  Specimen: Left breast tissue with seed and clip in specimen verified by Faxitron  Drains: None  Indications for procedure: The patient presents for left breast lumpectomy secondary to a complex sclerosing lesion/radial scar noted after imaging and core biopsy.  We discussed potential upgrade risk of these lesions and the rationale for lumpectomy.  We discussed observation as well as another option.  She opted for lumpectomy given her increased lifetime risk of breast cancer and family history.The procedure has been discussed with the patient. Alternatives to surgery have been discussed with the patient.  Risks of surgery include bleeding,  Infection,  Seroma formation, death,  and the need for further surgery.   The patient understands and wishes to proceed.  Description of procedure: The patient was met in the holding area.  The left breast was marked as the correct side and neoprobe was used to verify seed location and activity.  She was taken back to the operating.  She is placed supine upon the OR table.  After induction of general esthesia, left breast was prepped and draped in sterile fashion and timeout was performed.  Proper patient, site and procedure were verified.  Neoprobe was used to reidentify the seed in the left breast at the inferior border of the nipple areolar complex.  Curvilinear incision was made at this border and dissection was carried down to excise all tissue around the seed and clip which was fairly superficial.  A image of the seed and clip revealed the tissue seed and clip to be present in specimen.  The cavity was then made hemostatic with cautery.  3-0 Vicryl was used to close the wound and 4-0 Monocryl was used to approximate the skin in  a subcuticular fashion.  Dermabond was applied.  All counts were found to be correct.  The patient was then awoke extubated taken to recovery in satisfactory condition.

## 2019-06-12 NOTE — Discharge Instructions (Signed)
5mg  oxycodone given at 2:18pm, may take next dose of Norco at 8:18pm No Tylenol until 6pm No ibuprofen until [place] Office Phone Number [phone]  BREAST BIOPSY/ PARTIAL MASTECTOMY: POST OP INSTRUCTIONS  Always review your discharge instruction sheet given to you by the facility where your surgery was performed.  IF YOU HAVE DISABILITY OR FAMILY LEAVE FORMS, YOU MUST BRING THEM TO THE OFFICE FOR PROCESSING.  DO NOT GIVE THEM TO YOUR DOCTOR.  1. A prescription for pain medication may be given to you upon discharge.  Take your pain medication as prescribed, if needed.  If narcotic pain medicine is not needed, then you may take acetaminophen (Tylenol) or ibuprofen (Advil) as needed. 2. Take your usually prescribed medications unless otherwise directed 3. If you need a refill on your pain medication, please contact your pharmacy.  They will contact our office to request authorization.  Prescriptions will not be filled after 5pm or on week-ends. 4. You should eat very light the first 24 hours after surgery, such as soup, crackers, pudding, etc.  Resume your normal diet the day after surgery. 5. Most patients will experience some swelling and bruising in the breast.  Ice packs and a good support bra will help.  Swelling and bruising can take several days to resolve.  6. It is common to experience some constipation if taking pain medication after surgery.  Increasing fluid intake and taking a stool softener will usually help or prevent this problem from occurring.  A mild laxative (Milk of Magnesia or Miralax) should be taken according to package directions if there are no bowel movements after 48 hours. 7. Unless discharge instructions indicate otherwise, you may remove your bandages 24-48 hours after surgery, and you may shower at that time.  You may have steri-strips (small skin tapes) in place directly over the incision.  These strips should be left on the skin for 7-10  days.  If your surgeon used skin glue on the incision, you may shower in 24 hours.  The glue will flake off over the next 2-3 weeks.  Any sutures or staples will be removed at the office during your follow-up visit. 8. ACTIVITIES:  You may resume regular daily activities (gradually increasing) beginning the next day.  Wearing a good support bra or sports bra minimizes pain and swelling.  You may have sexual intercourse when it is comfortable. a. You may drive when you no longer are taking prescription pain medication, you can comfortably wear a seatbelt, and you can safely maneuver your car and apply brakes. b. RETURN TO WORK:  ______________________________________________________________________________________ 9. You should see your doctor in the office for a follow-up appointment approximately two weeks after your surgery.  Your doctor's nurse will typically make your follow-up appointment when she calls you with your pathology report.  Expect your pathology report 2-3 business days after your surgery.  You may call to check if you do not hear from [place] after three days. 10. OTHER INSTRUCTIONS: _______________________________________________________________________________________________ _____________________________________________________________________________________________________________________________________ _____________________________________________________________________________________________________________________________________ _____________________________________________________________________________________________________________________________________  WHEN TO CALL YOUR DOCTOR: 1. Fever over 101.0 2. Nausea and/or vomiting. 3. Extreme swelling or bruising. 4. Continued bleeding from incision. 5. Increased pain, redness, or drainage from the incision.  The clinic staff is available to answer your questions during regular business hours.  Please don't hesitate to call  and ask to speak to one of the nurses for clinical concerns.  If you have a medical emergency, go to the nearest emergency room or call  911.  A surgeon from [facility] is always on call at the hospital.  For further questions, please visit [email or web address]   Post Anesthesia Home Care Instructions  Activity: Get plenty of rest for the remainder of the day. A responsible individual must stay with you for 24 hours following the procedure.  For the next 24 hours, DO NOT: -Drive a car -Paediatric nurse -Drink alcoholic beverages -Take any medication unless instructed by your physician -Make any legal decisions or sign important papers.  Meals: Start with liquid foods such as gelatin or soup. Progress to regular foods as tolerated. Avoid greasy, spicy, heavy foods. If nausea and/or vomiting occur, drink only clear liquids until the nausea and/or vomiting subsides. Call your physician if vomiting continues.  Special Instructions/Symptoms: Your throat may feel dry or sore from the anesthesia or the breathing tube placed in your throat during surgery. If this causes discomfort, gargle with warm salt water. The discomfort should disappear within 24 hours.  If you had a scopolamine patch placed behind your ear for the management of post- operative nausea and/or vomiting:  1. The medication in the patch is effective for 72 hours, after which it should be removed.  Wrap patch in a tissue and discard in the trash. Wash hands thoroughly with soap and water. 2. You may remove the patch earlier than 72 hours if you experience unpleasant side effects which may include dry mouth, dizziness or visual disturbances. 3. Avoid touching the patch. Wash your hands with soap and water after contact with the patch.

## 2019-06-12 NOTE — Anesthesia Preprocedure Evaluation (Addendum)
Anesthesia Evaluation  Patient identified by MRN, date of birth, ID band Patient awake    Reviewed: Allergy & Precautions, H&P , NPO status , Patient's Chart, lab work & pertinent test results, reviewed documented beta blocker date and time   Airway Mallampati: II  TM Distance: >3 FB Neck ROM: full    Dental no notable dental hx. (+) Partial Upper, Teeth Intact, Dental Advisory Given   Pulmonary neg pulmonary ROS,    Pulmonary exam normal breath sounds clear to auscultation       Cardiovascular Exercise Tolerance: Good negative cardio ROS   Rhythm:regular Rate:Normal     Neuro/Psych PSYCHIATRIC DISORDERS Anxiety Depression negative neurological ROS     GI/Hepatic negative GI ROS, Neg liver ROS,   Endo/Other  negative endocrine ROS  Renal/GU negative Renal ROS  negative genitourinary   Musculoskeletal   Abdominal   Peds  Hematology negative hematology ROS (+)   Anesthesia Other Findings   Reproductive/Obstetrics negative OB ROS                            Anesthesia Physical Anesthesia Plan  ASA: II  Anesthesia Plan: General   Post-op Pain Management:    Induction: Intravenous  PONV Risk Score and Plan: 3 and Ondansetron, Dexamethasone, Treatment may vary due to age or medical condition and Midazolam  Airway Management Planned: LMA and Oral ETT  Additional Equipment:   Intra-op Plan:   Post-operative Plan:   Informed Consent: I have reviewed the patients History and Physical, chart, labs and discussed the procedure including the risks, benefits and alternatives for the proposed anesthesia with the patient or authorized representative who has indicated his/her understanding and acceptance.     Dental Advisory Given  Plan Discussed with: CRNA and Anesthesiologist  Anesthesia Plan Comments: ( )       Anesthesia Quick Evaluation

## 2019-06-12 NOTE — Anesthesia Postprocedure Evaluation (Signed)
Anesthesia Post Note  Patient: Lisa Golden  Procedure(s) Performed: LEFT BREAST LUMPECTOMY WITH RADIOACTIVE SEED LOCALIZATION (Left Breast)     Patient location during evaluation: PACU Anesthesia Type: General Level of consciousness: awake and alert Pain management: pain level controlled Vital Signs Assessment: post-procedure vital signs reviewed and stable Respiratory status: spontaneous breathing, nonlabored ventilation, respiratory function stable and patient connected to nasal cannula oxygen Cardiovascular status: blood pressure returned to baseline and stable Postop Assessment: no apparent nausea or vomiting Anesthetic complications: no    Last Vitals:  Vitals:   06/12/19 1400 06/12/19 1413  BP: 114/71 121/83  Pulse: 89 85  Resp: 11 16  Temp:  37.2 C  SpO2: 99% 100%    Last Pain:  Vitals:   06/12/19 1413  TempSrc:   PainSc: 4                  Anastasya Jewell

## 2019-06-12 NOTE — Interval H&P Note (Signed)
History and Physical Interval Note:  06/12/2019 12:24 PM  Lisa Golden  has presented today for surgery, with the diagnosis of Left breast radial scar.  The various methods of treatment have been discussed with the patient and family. After consideration of risks, benefits and other options for treatment, the patient has consented to  Procedure(s): LEFT BREAST LUMPECTOMY WITH RADIOACTIVE SEED LOCALIZATION (Left) as a surgical intervention.  The patient's history has been reviewed, patient examined, no change in status, stable for surgery.  I have reviewed the patient's chart and labs.  Questions were answered to the patient's satisfaction.     [name]

## 2019-06-16 ENCOUNTER — Telehealth: Payer: Self-pay | Admitting: Internal Medicine

## 2019-06-16 LAB — SURGICAL PATHOLOGY

## 2019-06-16 NOTE — Telephone Encounter (Signed)
I returned the call and she has seen results on My Chart. She feels well.

## 2019-06-16 NOTE — Telephone Encounter (Signed)
Lisa Golden returned your call about her pathology report. She stated that no one has called her with the results of the pathology. She would like for you to call her back at your convince.

## 2019-06-17 ENCOUNTER — Encounter: Payer: Self-pay | Admitting: *Deleted

## 2019-06-30 ENCOUNTER — Telehealth: Payer: Self-pay | Admitting: Internal Medicine

## 2019-06-30 NOTE — Telephone Encounter (Signed)
Lisa Golden [phone]  Roselyn Reef called to say she would like to talk to you about her oncologist wanting to put her on tamoxifen, she would like to discuss medication and side effects about taking it along with her other medications.

## 2019-06-30 NOTE — Telephone Encounter (Signed)
OV next week. I do not see a note from oncologist in [place] yet

## 2019-07-01 NOTE — Telephone Encounter (Signed)
The medication was actually recommenced by Dr Brantley Stage, she wants to discuss when she comes in for CPE on 07/14/2019

## 2019-07-11 ENCOUNTER — Other Ambulatory Visit: Payer: 59 | Admitting: Internal Medicine

## 2019-07-11 ENCOUNTER — Other Ambulatory Visit: Payer: Self-pay

## 2019-07-11 DIAGNOSIS — R030 Elevated blood-pressure reading, without diagnosis of hypertension: Secondary | ICD-10-CM

## 2019-07-11 DIAGNOSIS — E7800 Pure hypercholesterolemia, unspecified: Secondary | ICD-10-CM

## 2019-07-11 DIAGNOSIS — F329 Major depressive disorder, single episode, unspecified: Secondary | ICD-10-CM

## 2019-07-11 DIAGNOSIS — Z0000 Encounter for general adult medical examination without abnormal findings: Secondary | ICD-10-CM

## 2019-07-11 DIAGNOSIS — F419 Anxiety disorder, unspecified: Secondary | ICD-10-CM

## 2019-07-11 DIAGNOSIS — E782 Mixed hyperlipidemia: Secondary | ICD-10-CM

## 2019-07-11 DIAGNOSIS — F32A Depression, unspecified: Secondary | ICD-10-CM

## 2019-07-14 ENCOUNTER — Other Ambulatory Visit: Payer: Self-pay

## 2019-07-14 ENCOUNTER — Ambulatory Visit (INDEPENDENT_AMBULATORY_CARE_PROVIDER_SITE_OTHER): Payer: 59 | Admitting: Internal Medicine

## 2019-07-14 ENCOUNTER — Encounter: Payer: Self-pay | Admitting: Internal Medicine

## 2019-07-14 VITALS — BP 102/80 | HR 96 | Ht 67.0 in | Wt 199.0 lb

## 2019-07-14 DIAGNOSIS — E7800 Pure hypercholesterolemia, unspecified: Secondary | ICD-10-CM

## 2019-07-14 DIAGNOSIS — Z6831 Body mass index (BMI) 31.0-31.9, adult: Secondary | ICD-10-CM | POA: Diagnosis not present

## 2019-07-14 DIAGNOSIS — R739 Hyperglycemia, unspecified: Secondary | ICD-10-CM

## 2019-07-14 DIAGNOSIS — F419 Anxiety disorder, unspecified: Secondary | ICD-10-CM | POA: Diagnosis not present

## 2019-07-14 DIAGNOSIS — Z0000 Encounter for general adult medical examination without abnormal findings: Secondary | ICD-10-CM | POA: Diagnosis not present

## 2019-07-14 DIAGNOSIS — F329 Major depressive disorder, single episode, unspecified: Secondary | ICD-10-CM

## 2019-07-14 DIAGNOSIS — Z639 Problem related to primary support group, unspecified: Secondary | ICD-10-CM | POA: Insufficient documentation

## 2019-07-14 DIAGNOSIS — N938 Other specified abnormal uterine and vaginal bleeding: Secondary | ICD-10-CM | POA: Insufficient documentation

## 2019-07-14 DIAGNOSIS — F32A Depression, unspecified: Secondary | ICD-10-CM

## 2019-07-14 NOTE — Progress Notes (Signed)
   Subjective:    Patient ID: Lisa Golden, female    DOB: [birth year], 44 y.o.   MRN: [id number]  HPI 44 year old Female for health maintenance exam and evaluation of medical issues.  Mother diagnosed recently with Invasive ductal carcinoma  ER positive, PR positive patient says.  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 Says surgeon has recommended Tamoxifen but she is not sure she wants to take it. Would like for her to see oncologist for evaluation. She is agreeable.  Patient remains over weight.  BMI is 31.17.  Weight 199 pounds.  In 2020 she weighed 190 pounds.  Talked about options for weight loss.  [facility] is GYN physician.  Past medical history: Fractured right arm 1980.  Urinary tract infections 1995 in 1998.  No history of serious illnesses.  Family history: Mother diagnosed with breast cancer recently.  Patient has 1 brother.  Father in good health.  Social history: She is divorced and has a son who is 38 years old.  After birth of her son she developed postpartum depression.  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  Does not smoke or consume alcohol.  In 50 3:12 year absence from seeing her, she returned to the practice and we placed her on Prozac and Xanax for anxiety and depression.  In 2016 she decided to taper off of Prozac and did well until her son became ill with school.  He had a syncopal episode which was frightening and traumatic for her.  He had no significant neurological issues fortunately.  She at that point went back on Prozac and Xanax.        Review of Systems  Constitutional:       Obesity  Respiratory: Negative.   Cardiovascular: Negative.   Gastrointestinal: Negative.   Neurological: Negative.   Psychiatric/Behavioral:       History of anxiety and depression       Objective:   Physical Exam Blood  pressure 102/80 pulse 96 pulse oximetry 99% weight 199 pounds height 5 feet 7 inches BMI 31.17 Skin warm and dry.  Nodes none.  TMs are clear.  Neck is supple without thyromegaly or adenopathy.  Chest clear to auscultation.  Cardiac exam regular rate and rhythm normal S1 and S2.  Abdomen soft nondistended without hepatosplenomegaly masses or tenderness.  Breast exam and GYN exam deferred to GYN physician.  Extremities without edema.  Neuro no gross focal deficits.  Affect is anxious.  She is concerned about whether or not she should take tamoxifen.       Assessment & Plan:  S/P recent lumpectomy. Mother dx with breast CA. 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 Pt. says surgeon has recommended Tamoxifen prophylactically for 5 years given family history. Will ask Oncologist to give opinion regarding this therapy.  Obesity- continue to work on diet and exercise.  Information given on Dr. Migdalia Dk clinic for weight loss.  Anxiety depression continue Xanax 0.  5 mg twice daily as needed.  Continue Prozac 20 mg daily.  She has a history of hyperlipidemia.  In October total cholesterol was 236 and HDL cholesterol was 61 with LDL cholesterol of 153.  We started her on low-dose Crestor 5 mg daily and now lipids are normal.

## 2019-07-14 NOTE — Patient Instructions (Addendum)
It was a pleasure to see you today. Work on diet and exefcise. RTC in one year or as needed. Consider Dr. Migdalia Dk clinic for weight loss. Oncology consult to discuss Tamoxifen therapy for prophylaxis.  Continue Xanax and Prozac.  Continue Crestor 5 mg daily.

## 2019-07-15 LAB — CBC WITH DIFFERENTIAL/PLATELET
Absolute Monocytes: 621 cells/uL (ref 200–950)
Basophils Absolute: 41 cells/uL (ref 0–200)
Basophils Relative: 0.7 %
Eosinophils Absolute: 52 cells/uL (ref 15–500)
Eosinophils Relative: 0.9 %
HCT: 38.8 % (ref 35.0–45.0)
Hemoglobin: 12.9 g/dL (ref 11.7–15.5)
Lymphs Abs: 1502 cells/uL (ref 850–3900)
MCH: 29.9 pg (ref 27.0–33.0)
MCHC: 33.2 g/dL (ref 32.0–36.0)
MCV: 90 fL (ref 80.0–100.0)
MPV: 11.8 fL (ref 7.5–12.5)
Monocytes Relative: 10.7 %
Neutro Abs: 3584 cells/uL (ref 1500–7800)
Neutrophils Relative %: 61.8 %
Platelets: 310 10*3/uL (ref 140–400)
RBC: 4.31 10*6/uL (ref 3.80–5.10)
RDW: 12 % (ref 11.0–15.0)
Total Lymphocyte: 25.9 %
WBC: 5.8 10*3/uL (ref 3.8–10.8)

## 2019-07-15 LAB — COMPLETE METABOLIC PANEL WITH GFR
AG Ratio: 1.6 (calc) (ref 1.0–2.5)
ALT: 11 U/L (ref 6–29)
AST: 14 U/L (ref 10–30)
Albumin: 4.3 g/dL (ref 3.6–5.1)
Alkaline phosphatase (APISO): 55 U/L (ref 31–125)
BUN: 10 mg/dL (ref 7–25)
CO2: 23 mmol/L (ref 20–32)
Calcium: 9.4 mg/dL (ref 8.6–10.2)
Chloride: 103 mmol/L (ref 98–110)
Creat: 0.73 mg/dL (ref 0.50–1.10)
GFR, Est African American: 116 mL/min/{1.73_m2} (ref >=60)
GFR, Est Non African American: 100 mL/min/{1.73_m2} (ref >=60)
Globulin: 2.7 g/dL (calc) (ref 1.9–3.7)
Glucose, Bld: 107 mg/dL — ABNORMAL HIGH (ref 65–99)
Potassium: 4.6 mmol/L (ref 3.5–5.3)
Sodium: 135 mmol/L (ref 135–146)
Total Bilirubin: 0.8 mg/dL (ref 0.2–1.2)
Total Protein: 7 g/dL (ref 6.1–8.1)

## 2019-07-15 LAB — LIPID PANEL
Cholesterol: 156 mg/dL (ref <200)
HDL: 51 mg/dL (ref >=50)
LDL Cholesterol (Calc): 89 mg/dL (calc)
Non-HDL Cholesterol (Calc): 105 mg/dL (calc) (ref <130)
Total CHOL/HDL Ratio: 3.1 (calc) (ref <5.0)
Triglycerides: 74 mg/dL (ref <150)

## 2019-07-15 LAB — TEST AUTHORIZATION

## 2019-07-15 LAB — HEMOGLOBIN A1C W/OUT EAG: Hgb A1c MFr Bld: 5.4 % of total Hgb (ref <5.7)

## 2019-07-15 LAB — VITAMIN D 25 HYDROXY (VIT D DEFICIENCY, FRACTURES): Vit D, 25-Hydroxy: 41 ng/mL (ref 30–100)

## 2019-07-15 LAB — TSH: TSH: 1.58 mIU/L

## 2019-07-21 LAB — POCT URINALYSIS DIPSTICK
Appearance: NEGATIVE
Bilirubin, UA: NEGATIVE
Blood, UA: NEGATIVE
Glucose, UA: NEGATIVE
Ketones, UA: NEGATIVE
Leukocytes, UA: NEGATIVE
Nitrite, UA: NEGATIVE
Odor: NEGATIVE
Protein, UA: NEGATIVE
Spec Grav, UA: 1.01 (ref 1.010–1.025)
Urobilinogen, UA: 0.2 E.U./dL
pH, UA: 6.5 (ref 5.0–8.0)

## 2019-07-26 ENCOUNTER — Other Ambulatory Visit: Payer: Self-pay | Admitting: Internal Medicine

## 2019-08-04 ENCOUNTER — Telehealth: Payer: Self-pay | Admitting: Oncology

## 2019-08-04 NOTE — Telephone Encounter (Signed)
Received a new patient referral from Dr. Renold Genta for hx of lumpectomy and ductal papilloma. Pt has been scheduled to see Dr. Jana Hakim on 3/30 at 4pm w/labs at 330pm. Pt aware to arrive 15 minutes early.

## 2019-08-05 ENCOUNTER — Other Ambulatory Visit: Payer: Self-pay

## 2019-08-05 ENCOUNTER — Inpatient Hospital Stay: Payer: 59

## 2019-08-05 ENCOUNTER — Inpatient Hospital Stay: Payer: 59 | Attending: Oncology | Admitting: Oncology

## 2019-08-05 DIAGNOSIS — Z9889 Other specified postprocedural states: Secondary | ICD-10-CM | POA: Diagnosis not present

## 2019-08-05 DIAGNOSIS — Z1239 Encounter for other screening for malignant neoplasm of breast: Secondary | ICD-10-CM

## 2019-08-05 DIAGNOSIS — Z9189 Other specified personal risk factors, not elsewhere classified: Secondary | ICD-10-CM

## 2019-08-05 DIAGNOSIS — Z7189 Other specified counseling: Secondary | ICD-10-CM | POA: Insufficient documentation

## 2019-08-05 DIAGNOSIS — Z803 Family history of malignant neoplasm of breast: Secondary | ICD-10-CM | POA: Diagnosis not present

## 2019-08-05 NOTE — Progress Notes (Signed)
[facility] [place]  [phone] [phone]     ID: Lisa Golden DOB: [birth year]  MR#: [id number]  UUV#:[id number]  Patient Care Team: Elby Showers, MD as PCP - General (Internal Medicine) Donnel Saxon, CNM as Consulting Physician (Obstetrics and Gynecology) Erroll Luna, MD as Consulting Physician (General Surgery) Stedman [place], Virgie Dad, MD as Consulting Physician (Oncology) Chauncey Cruel, MD OTHER MD:  CHIEF COMPLAINT: high risk for breast cancer  CURRENT TREATMENT: To start tamoxifen and intensified screening.   HISTORY OF CURRENT ILLNESS: Lisa Golden had routine screening mammography showing a possible abnormality in the left breast. She underwent left diagnostic mammography with tomography and left breast ultrasonography at [facility] on 04/07/2019 showing: breast density category B; indeterminate 11 mm left breast mass at 4 o'clock; no axillary adenopathy.  Accordingly on 04/07/2019 she proceeded to biopsy of the left breast area in question. The pathology from this procedure ([id number]) showed: complex sclerosing lesion.   She underwent genetic counseling on 05/22/2019; she was found to be high risk, but she opted against genetic testing due to her family history apparently not meeting insurance criteria.  She proceeded to left lumpectomy on 06/12/2019 under Dr. Brantley Stage. 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  The patient's subsequent history is as detailed below.   INTERVAL HISTORY: Lisa Golden was evaluated in the high risk breast cancer clinic on 08/05/2019.  She had previously met in this regard with Dr. Brantley Stage who suggested intensified screening and tamoxifen.  She is here today for further discussion.   REVIEW OF SYSTEMS: There were no specific symptoms leading to the original mammogram, which was routinely scheduled. The patient denies unusual headaches, visual changes,  nausea, vomiting, stiff neck, dizziness, or gait imbalance. There has been no cough, phlegm production, or pleurisy, no chest pain or pressure, and no change in bowel or bladder habits. The patient denies fever, rash, bleeding, unexplained fatigue or unexplained weight loss.  She is not exercising regularly.  She is tolerating Crestor well, with no significant side effects A detailed review of systems was otherwise entirely negative.   PAST MEDICAL HISTORY: Past Medical History:  Diagnosis Date  . Anxiety   . Depression   . Family history of brain cancer   . Family history of breast cancer   . Family history of melanoma   . History of COVID-19 04/21/2019    PAST SURGICAL HISTORY: Past Surgical History:  Procedure Laterality Date  . BREAST LUMPECTOMY WITH RADIOACTIVE SEED LOCALIZATION Left 06/12/2019   Procedure: LEFT BREAST LUMPECTOMY WITH RADIOACTIVE SEED LOCALIZATION;  Surgeon: Erroll Luna, MD;  Location: [facility];  Service: General;  Laterality: Left;  . NO PAST SURGERIES    Status post wisdom teeth removal  FAMILY HISTORY: Family History  Problem Relation Age of Onset  . Breast cancer Mother 32  . Gout Father   . Kidney Stones Father   . Breast cancer Maternal Grandmother 43  . Brain cancer Paternal Uncle        dx. early 81s  The patient's father is 7 years old as of March 2021.  Neither he nor his parents have any history of breast cancer.  He has 2 brothers, 1 of whom had a primary brain cancer.  The patient's mother is 36 years old as of March 2021.  Her mother had breast cancer diagnosed in her 75s.  A great aunt, sister of the patient's mother's mother, died of melanoma in  her 27s.  The patient's paternal grand father did not have cancer.  The patient's mother had 2 sisters and 2 brothers, none with cancer.  The patient herself has 1 brother and 1 half-sister (same father).  Neither have cancer.   GYNECOLOGIC HISTORY:  Patient's last menstrual period  was 07/14/2019. Menarche: 44 years old Age at first live birth: 44 years old [place] P 1 Having regular periods Contraceptive: Significant other status post vasectomy HRT n/a  Hysterectomy? no BSO? 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  She is divorced.  At home she lives with her significant other Lisa Golden who works in Chief Executive Officer for a company that makes [employer].  He has 2 children of his own in [place], both grown.  Also at home is the patient's son Lisa Golden, age 55 as of March 2021.  The patient attends a Physicist, medical church [employer].   HEALTH MAINTENANCE: Social History   Tobacco Use  . Smoking status: Never Smoker  . Smokeless tobacco: Never Used  Substance Use Topics  . Alcohol use: No  . Drug use: No     Colonoscopy: n/a (age)  PAP: up to date, per patient  Bone density: n/a (age)   No Known Allergies  Current Outpatient Medications  Medication Sig Dispense Refill  . ALPRAZolam (XANAX) 0.5 MG tablet TAKE 1 TABLET BY MOUTH TWICE A DAY 60 tablet 1  . Ergocalciferol 2000 UNITS TABS Take 2,000 Units by mouth.    Marland Kitchen FLUoxetine (PROZAC) 20 MG capsule TAKE 1 CAPSULE BY MOUTH EVERY DAY 90 capsule 1  . rosuvastatin (CRESTOR) 5 MG tablet TAKE 1 TABLET BY MOUTH EVERY DAY 90 tablet 1   No current facility-administered medications for this visit.    OBJECTIVE: Young white woman in no acute distress  Vitals:   08/05/19 1551  BP: 114/74  Pulse: 80  Resp: 18  Temp: 98.5 F (36.9 C)  SpO2: 100%     Body mass index is 29.55 kg/m.   Wt Readings from Last 3 Encounters:  08/05/19 200 lb 1.6 oz (90.8 kg)  07/14/19 199 lb (90.3 kg)  06/12/19 196 lb 6.9 oz (89.1 kg)      ECOG FS:0 - Asymptomatic  Ocular: Sclerae unicteric, pupils round and equal Ear-nose-throat: Wearing a mask Lymphatic: No cervical or supraclavicular  adenopathy Lungs no rales or rhonchi Heart regular rate and rhythm Abd soft, nontender, positive bowel sounds MSK no focal spinal tenderness, no joint edema Neuro: non-focal, well-oriented, positive affect Breasts: The right breast is benign per the left breast is status post recent lumpectomy.  The cosmetic result is excellent.  There is no dehiscence erythema or swelling.  Both axillae are benign.   LAB RESULTS:  CMP     Component Value Date/Time   NA 135 07/11/2019 0908   K 4.6 07/11/2019 0908   CL 103 07/11/2019 0908   CO2 23 07/11/2019 0908   GLUCOSE 107 (H) 07/11/2019 0908   BUN 10 07/11/2019 0908   CREATININE 0.73 07/11/2019 0908   CALCIUM 9.4 07/11/2019 0908   PROT 7.0 07/11/2019 0908   ALBUMIN 4.4 06/09/2019 1100   AST 14 07/11/2019 0908   ALT 11 07/11/2019 0908   ALKPHOS 56 06/09/2019 1100   BILITOT 0.8 07/11/2019 0908   GFRNONAA 100 07/11/2019 0908   GFRAA 116 07/11/2019 0908    No results found for: TOTALPROTELP,  ALBUMINELP, A1GS, A2GS, BETS, BETA2SER, GAMS, MSPIKE, SPEI  Lab Results  Component Value Date   WBC 5.8 07/11/2019   NEUTROABS 3,584 07/11/2019   HGB 12.9 07/11/2019   HCT 38.8 07/11/2019   MCV 90.0 07/11/2019   PLT 310 07/11/2019    No results found for: LABCA2  No components found for: OHFGBM211  No results for input(s): INR in the last 168 hours.  No results found for: LABCA2  No results found for: DBZ208  No results found for: YEM336  No results found for: PQA449  No results found for: CA2729  No components found for: HGQUANT  No results found for: CEA1 / No results found for: CEA1   No results found for: AFPTUMOR  No results found for: CHROMOGRNA  No results found for: KPAFRELGTCHN, LAMBDASER, KAPLAMBRATIO (kappa/lambda light chains)  No results found for: HGBA, HGBA2QUANT, HGBFQUANT, HGBSQUAN (Hemoglobinopathy evaluation)   No results found for: LDH  No results found for: IRON, TIBC, IRONPCTSAT (Iron and  TIBC)  No results found for: FERRITIN  Urinalysis    Component Value Date/Time   BILIRUBINUR NEG 07/14/2019 1111   PROTEINUR Negative 07/14/2019 1111   UROBILINOGEN 0.2 07/14/2019 1111   NITRITE NEG 07/14/2019 1111   LEUKOCYTESUR Negative 07/14/2019 1111   STUDIES.  STUDIES:   ELIGIBLE FOR AVAILABLE RESEARCH PROTOCOL: no  ASSESSMENT: 45 y.o. [place] woman status post left breast lumpectomy 06/12/2019 for a complex sclerosing lesion/papilloma, with no evidence of malignancy, but with a lifetime risk of breast cancer in the 30% (+/ - 5%) range  (1) intensified screening:  (a) yearly mammography with tomography  (b) yearly breast MRI 6 months apart from mammograms  (c) by annual physician breast exam  (2) risk reduction:  (a) to start tamoxifen April 2021  PLAN: I met today with Caytlyn to review her new diagnosis.  She understands that she does not have breast cancer and that the lesion noted on her recent lumpectomy was benign.  Nevertheless once we reviewed her basic data and put it through the [employer] calculator she does have an elevated risk of developing breast cancer in her lifetime, in the 30% range.  That means on the other hand that she has a 70% chance of not developing breast cancer in her lifetime.  In addition we are not talking about a 30% chance of dying from breast cancer.  We are curing most breast cancer is these days especially those found early.  If she does develop breast cancer she most likely will be cured.  Aneesah has essentially two ways of dealing with her elevated breast cancer risk risk.  One is trying to lower the risk if possible.  The other one is intensified screening.  Starting with strategies to reduce risk, she can consider bilateral mastectomies.  This would involve major surgery, likely requiring reconstruction, which may or may not be successful in the patient's eyes--many of my patients are not satisfied with the way the reconstructed  breasts look.  The surgery may or may not be covered by her insurance.  Similarly she can consider bilateral salpingo-oophorectomy or the use of goserelin for 5 years to "put her ovaries to sleep".  The disadvantages here are all the symptoms of early menopause including hot flashes insomnia weight gain and mood changes bone density loss vaginal dryness hair thinning, etc.  A more reasonable risk reduction approach would be to take anti-estrogens for 5 years.  For premenopausal women like Kerriann tamoxifen is really the only choice.  We then discussed the possible toxicities, side effects and complications of the tamoxifen group as opposed to the aromatase inhibitors.  This information was given to [place] in writing.  She is very interested in proceeding with this option.  After discussing the various ways of decreasing risk we moved to intensified screening. This would mean adding a yearly MRI to yearly mammography with tomography.  Breast MRI greatly increases sensitivity over mammography alone. Any breast cancer that may develop should be found at the earliest possible stage, making it not only more curable but also less likely to require chemotherapy or major surgery. The main concern with this approach is the possibility of "false positives " requiring repeated studies, biopsies, etc.  Also there are issues regarding cost , which we discussed.   After this discussion Yalda would like to proceed with both tamoxifen and intensified screening.  These are being set up through Dr. Josetta Huddle office and he will also follow her for possible complications and side effects.  Accordingly I am not making a return appointment here for Tanaya though I will be glad to see her again at any point in the future if and when the need arises.  Total encounter time 60 minutes.Lisa Jews C. Lorry Anastasi, MD 08/05/2019 5:00 PM Medical Oncology and Hematology [facility] [place], [place] [address] Tel.  [phone]    Fax. [phone]   This document serves as a record of services personally performed by Lurline Del, MD. It was created on his behalf by Wilburn Mylar, a trained medical scribe. The creation of this record is based on the scribe's personal observations and the provider's statements to them.   I, Lurline Del MD, have reviewed the above documentation for accuracy and completeness, and I agree with the above.    *Total Encounter Time as defined by the Centers for Medicare and Medicaid Services includes, in addition to the face-to-face time of a patient visit (documented in the note above) non-face-to-face time: obtaining and reviewing outside history, ordering and reviewing medications, tests or procedures, care coordination (communications with other health care professionals or caregivers) and documentation in the medical record.

## 2019-08-05 NOTE — Progress Notes (Signed)
Many thanks- this is helpful.

## 2019-08-06 ENCOUNTER — Telehealth: Payer: Self-pay | Admitting: Oncology

## 2019-08-06 NOTE — Telephone Encounter (Signed)
No 3/30 los. No changes made to pt's schedule.

## 2020-01-28 ENCOUNTER — Telehealth: Payer: Self-pay | Admitting: Internal Medicine

## 2020-01-28 ENCOUNTER — Encounter: Payer: Self-pay | Admitting: Internal Medicine

## 2020-01-28 NOTE — Telephone Encounter (Signed)
Lisa Golden [phone]  Roselyn Reef called to say she for the last week she has had a severe burning pain in her abdomen area under left breast behind rib cage that comes and goes. At times it is stabbing and gets worse when taking a deep breath. She is beginning to get worried about it, because it does not seem to be going away. She would like to be seen, she is supposed to go to [facility] in the AM tomorrow, could come in late afternoon or Friday.

## 2020-01-29 NOTE — Telephone Encounter (Signed)
Schedule appointment?

## 2020-01-30 ENCOUNTER — Other Ambulatory Visit: Payer: Self-pay | Admitting: Internal Medicine

## 2020-02-03 ENCOUNTER — Ambulatory Visit: Payer: 59 | Admitting: Internal Medicine

## 2020-04-03 ENCOUNTER — Other Ambulatory Visit: Payer: Self-pay | Admitting: Internal Medicine

## 2020-07-12 ENCOUNTER — Other Ambulatory Visit: Payer: Self-pay

## 2020-07-12 ENCOUNTER — Other Ambulatory Visit: Payer: 59 | Admitting: Internal Medicine

## 2020-07-12 DIAGNOSIS — F32A Depression, unspecified: Secondary | ICD-10-CM

## 2020-07-12 DIAGNOSIS — F419 Anxiety disorder, unspecified: Secondary | ICD-10-CM

## 2020-07-12 DIAGNOSIS — Z0000 Encounter for general adult medical examination without abnormal findings: Secondary | ICD-10-CM

## 2020-07-12 DIAGNOSIS — E7800 Pure hypercholesterolemia, unspecified: Secondary | ICD-10-CM

## 2020-07-16 ENCOUNTER — Encounter: Payer: Self-pay | Admitting: Internal Medicine

## 2020-07-16 ENCOUNTER — Ambulatory Visit (INDEPENDENT_AMBULATORY_CARE_PROVIDER_SITE_OTHER): Payer: 59 | Admitting: Internal Medicine

## 2020-07-16 ENCOUNTER — Other Ambulatory Visit: Payer: Self-pay

## 2020-07-16 VITALS — BP 120/80 | HR 84 | Ht 69.0 in | Wt 191.0 lb

## 2020-07-16 DIAGNOSIS — E782 Mixed hyperlipidemia: Secondary | ICD-10-CM

## 2020-07-16 DIAGNOSIS — F419 Anxiety disorder, unspecified: Secondary | ICD-10-CM | POA: Diagnosis not present

## 2020-07-16 DIAGNOSIS — Z6828 Body mass index (BMI) 28.0-28.9, adult: Secondary | ICD-10-CM

## 2020-07-16 DIAGNOSIS — N6012 Diffuse cystic mastopathy of left breast: Secondary | ICD-10-CM

## 2020-07-16 DIAGNOSIS — Z0000 Encounter for general adult medical examination without abnormal findings: Secondary | ICD-10-CM | POA: Diagnosis not present

## 2020-07-16 DIAGNOSIS — F32A Depression, unspecified: Secondary | ICD-10-CM

## 2020-07-16 DIAGNOSIS — Z803 Family history of malignant neoplasm of breast: Secondary | ICD-10-CM | POA: Diagnosis not present

## 2020-07-16 DIAGNOSIS — N912 Amenorrhea, unspecified: Secondary | ICD-10-CM

## 2020-07-16 LAB — CBC WITH DIFFERENTIAL/PLATELET
Absolute Monocytes: 527 cells/uL (ref 200–950)
Basophils Absolute: 31 cells/uL (ref 0–200)
Basophils Relative: 0.5 %
Eosinophils Absolute: 62 cells/uL (ref 15–500)
Eosinophils Relative: 1 %
HCT: 37.1 % (ref 35.0–45.0)
Hemoglobin: 12.4 g/dL (ref 11.7–15.5)
Lymphs Abs: 1953 cells/uL (ref 850–3900)
MCH: 30.7 pg (ref 27.0–33.0)
MCHC: 33.4 g/dL (ref 32.0–36.0)
MCV: 91.8 fL (ref 80.0–100.0)
MPV: 11.6 fL (ref 7.5–12.5)
Monocytes Relative: 8.5 %
Neutro Abs: 3627 cells/uL (ref 1500–7800)
Neutrophils Relative %: 58.5 %
Platelets: 264 10*3/uL (ref 140–400)
RBC: 4.04 10*6/uL (ref 3.80–5.10)
RDW: 11.8 % (ref 11.0–15.0)
Total Lymphocyte: 31.5 %
WBC: 6.2 10*3/uL (ref 3.8–10.8)

## 2020-07-16 LAB — COMPLETE METABOLIC PANEL WITH GFR
AG Ratio: 1.6 (calc) (ref 1.0–2.5)
ALT: 10 U/L (ref 6–29)
AST: 14 U/L (ref 10–35)
Albumin: 4.1 g/dL (ref 3.6–5.1)
Alkaline phosphatase (APISO): 48 U/L (ref 31–125)
BUN: 10 mg/dL (ref 7–25)
CO2: 28 mmol/L (ref 20–32)
Calcium: 9.4 mg/dL (ref 8.6–10.2)
Chloride: 104 mmol/L (ref 98–110)
Creat: 0.65 mg/dL (ref 0.50–1.10)
GFR, Est African American: 124 mL/min/{1.73_m2} (ref >=60)
GFR, Est Non African American: 107 mL/min/{1.73_m2} (ref >=60)
Globulin: 2.5 g/dL (calc) (ref 1.9–3.7)
Glucose, Bld: 92 mg/dL (ref 65–99)
Potassium: 4.3 mmol/L (ref 3.5–5.3)
Sodium: 139 mmol/L (ref 135–146)
Total Bilirubin: 0.5 mg/dL (ref 0.2–1.2)
Total Protein: 6.6 g/dL (ref 6.1–8.1)

## 2020-07-16 LAB — POCT URINALYSIS DIPSTICK
Appearance: NEGATIVE
Bilirubin, UA: NEGATIVE
Blood, UA: NEGATIVE
Glucose, UA: NEGATIVE
Ketones, UA: NEGATIVE
Leukocytes, UA: NEGATIVE
Nitrite, UA: NEGATIVE
Odor: NEGATIVE
Protein, UA: NEGATIVE
Spec Grav, UA: 1.01 (ref 1.010–1.025)
Urobilinogen, UA: 0.2 E.U./dL
pH, UA: 7 (ref 5.0–8.0)

## 2020-07-16 LAB — VITAMIN D 25 HYDROXY (VIT D DEFICIENCY, FRACTURES): Vit D, 25-Hydroxy: 57 ng/mL (ref 30–100)

## 2020-07-16 LAB — TSH: TSH: 1.77 mIU/L

## 2020-07-16 LAB — TEST AUTHORIZATION

## 2020-07-16 LAB — LIPID PANEL
Cholesterol: 196 mg/dL (ref <200)
HDL: 65 mg/dL (ref >=50)
LDL Cholesterol (Calc): 106 mg/dL (calc) — ABNORMAL HIGH
Non-HDL Cholesterol (Calc): 131 mg/dL (calc) — ABNORMAL HIGH (ref <130)
Total CHOL/HDL Ratio: 3 (calc) (ref <5.0)
Triglycerides: 137 mg/dL (ref <150)

## 2020-07-16 LAB — FOLLICLE STIMULATING HORMONE: FSH: 18.3 m[IU]/mL

## 2020-07-16 NOTE — Progress Notes (Signed)
Subjective:    Patient ID: Lisa Golden, female    DOB: Sep 03, 1975, 45 y.o.   MRN: [id number]  HPI 45 year old Female for health maintenance exam and evaluation of medical issues.   Patient had left breast biopsy in November 2020 for left breast mass at 4:00 1 cm from the nipple showing complex sclerosing lesion.  Subsequently underwent left lumpectomy and pathology showed ductal papilloma.  No evidence of malignancy.  Subsequently was seen by Dr. Jana Hakim regarding tamoxifen therapy.  He reviewed her basic data and calculated she had an elevated risk of developing breast cancer in her lifetime in the 30% range.  Tamoxifen was recommended for risk reduction as well as intensified screening.  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  She is divorced.  Lives with significant other.  She has a teenage son.  She is a non-smoker and does not consume alcohol.  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  Her fasting labs are reviewed and are essentially normal except for as slight elevation of LDL at 106.  This represents considerable improvement as she is on Crestor 5 mg daily.  Longstanding history of anxiety and depression treated with alprazolam and Prozac.  She was started on these medications in 2012.  In 2016 she tapered off of Prozac and did well until her son became ill at school.  He had a syncopal episode which was frightening and traumatic for her.  Therefore she went back on Prozac and Xanax at that time.  Additional past medical history: Influenza February 2017, fractured right arm 1980, urinary tract infections in 1995 in 1998.  Family history: Parents in good health.  She has 1 brother.  Review of Systems  Constitutional: Negative.   Respiratory: Negative.   Cardiovascular: Negative.   Gastrointestinal: Negative.   Genitourinary: Negative.   Neurological: Negative.   Psychiatric/Behavioral:  Negative.        Objective:   Physical Exam Vitals reviewed.  Constitutional:      Appearance: Normal appearance.  HENT:     Head: Normocephalic.     Right Ear: Tympanic membrane normal.     Left Ear: Tympanic membrane normal.     Nose: Nose normal.  Eyes:     General: No scleral icterus.       Right eye: No discharge.        Left eye: No discharge.     Extraocular Movements: Extraocular movements intact.     Pupils: Pupils are equal, round, and reactive to light.  Neck:     Comments: No thyromegaly or adenopathy Cardiovascular:     Rate and Rhythm: Normal rate and regular rhythm.     Heart sounds: Normal heart sounds. No murmur heard.   Pulmonary:     Effort: Pulmonary effort is normal.     Breath sounds: Normal breath sounds.  Abdominal:     General: There is no distension.     Palpations: Abdomen is soft.     Tenderness: There is no abdominal tenderness. There is no guarding or rebound.  Musculoskeletal:     Cervical back: Neck supple. No rigidity.     Right lower leg: No edema.     Left lower leg: No edema.  Lymphadenopathy:     Cervical: No cervical adenopathy.  Skin:    General: Skin is warm and dry.  Neurological:     General: No focal deficit  present.     Mental Status: She is alert and oriented to person, place, and time.     Cranial Nerves: No cranial nerve deficit.  Psychiatric:        Mood and Affect: Mood normal.        Behavior: Behavior normal.        Thought Content: Thought content normal.           Assessment & Plan:  Complex sclerosing lesion left breast treated with tamoxifen and annual imaging.  Resection in February 2021 showed this to be a ductal papilloma.  BMI 28.21-continue diet and exercise efforts  Family history of breast cancer in mother  History of anxiety and depression treated with Prozac and Xanax.  Started on low-dose Crestor with history of mixed hyperlipidemia in 2019.  Total cholesterol was 230, triglycerides 181  and LDL cholesterol 143.  Lipids are near normal now except for an LDL of 106.  Amenorrhea-FSH is normal.  Plan: Continue current medications and return in 1 year or as needed.  Continue to work on diet and exercise.

## 2020-07-29 ENCOUNTER — Other Ambulatory Visit: Payer: Self-pay | Admitting: Internal Medicine

## 2020-08-05 NOTE — Patient Instructions (Signed)
It was a pleasure to see you today.  Continue Prozac and Xanax as directed.  Continue Crestor.  Have regular follow-up on ductal papilloma/complex sclerosing lesion and continue 5-year course of tamoxifen.  Return in 1 year or as needed.

## 2020-10-01 ENCOUNTER — Other Ambulatory Visit: Payer: Self-pay | Admitting: Surgery

## 2020-10-01 DIAGNOSIS — Z9189 Other specified personal risk factors, not elsewhere classified: Secondary | ICD-10-CM

## 2020-10-21 ENCOUNTER — Telehealth: Payer: Self-pay | Admitting: Internal Medicine

## 2020-10-21 NOTE — Telephone Encounter (Signed)
Lisa Golden [phone]  [name] called to say she received bill from [facility] where her insurance did not pay for Vit D. She talked with [facility] and her insurance. Insurance said it was unproven that she needed this test, and [facility] said maybe another diagnoses code would work.

## 2020-10-22 NOTE — Telephone Encounter (Signed)
I have emailed Jocelyn Lamer.

## 2020-11-26 ENCOUNTER — Other Ambulatory Visit: Payer: Self-pay

## 2020-11-26 ENCOUNTER — Ambulatory Visit
Admission: RE | Admit: 2020-11-26 | Discharge: 2020-11-26 | Disposition: A | Discharge status: Home / Self Care | Payer: 59 | Source: Ambulatory Visit | Attending: Surgery | Admitting: Surgery

## 2020-11-26 DIAGNOSIS — Z1239 Encounter for other screening for malignant neoplasm of breast: Secondary | ICD-10-CM | POA: Diagnosis not present

## 2020-11-26 DIAGNOSIS — Z9189 Other specified personal risk factors, not elsewhere classified: Secondary | ICD-10-CM

## 2020-11-26 DIAGNOSIS — Z853 Personal history of malignant neoplasm of breast: Secondary | ICD-10-CM | POA: Diagnosis not present

## 2020-11-26 IMAGING — MR MR BREAST BILAT WO/W CM
8 of 12 series · 33 of 48 positions shown · IV contrast (10 ml gadavist)
Comparison: None.

CLINICAL DATA: High risk screening. History left breast
POPEYE/intraductal papilloma status post excision.

LABS:  None.
EXAM:
BILATERAL BREAST MRI WITH AND WITHOUT CONTRAST
TECHNIQUE: Multiplanar, multisequence MR images of both breasts were obtained
prior to and following the intravenous administration of 10 ml of
Gadavist

[Series 2: t2_tirm_tra ipat (a-p) · axial · 3.0mm · 0.70mm/px · 1 of 55 slices shown]
[im 1/55]
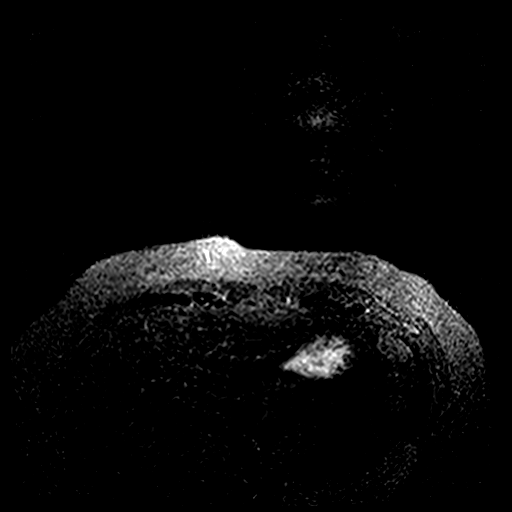

[Series 3: fl3d pre-cm no · axial · non-contrast · 1.2mm · 0.94mm/px · z∈[-74,+98]mm · 5 of 144 slices shown]
[im 1/144]
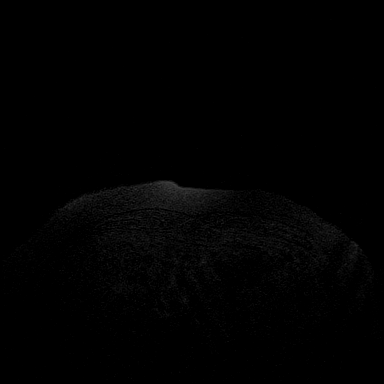
[im 36/144]
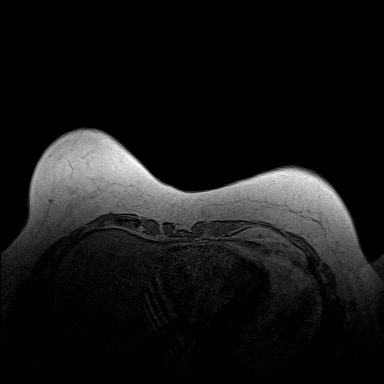
[im 72/144]
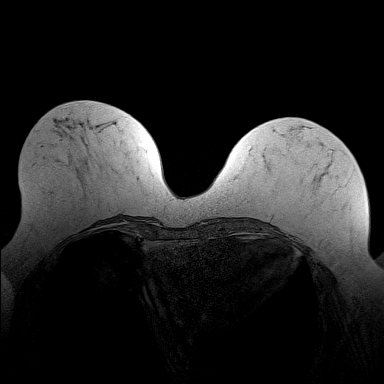
[im 108/144]
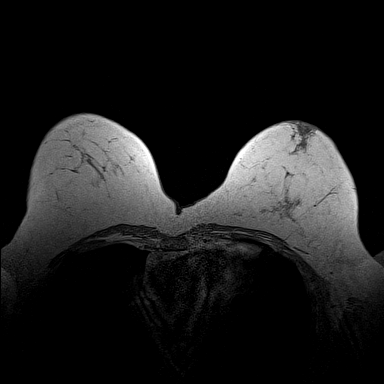
[im 144/144]
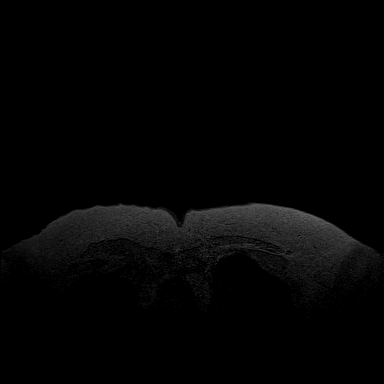

[Series 4: fl3d pre-cm · axial · non-contrast · 1.2mm · 0.94mm/px · z∈[-74,+98]mm · 5 of 144 slices shown]
[im 1/144]
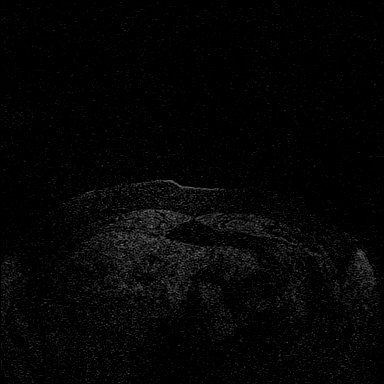
[im 36/144]
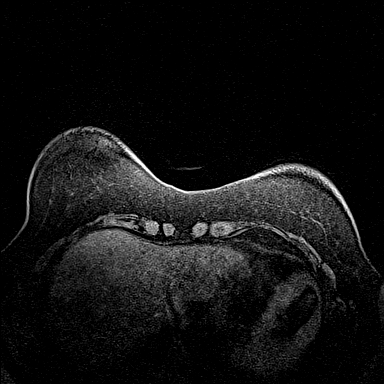
[im 72/144]
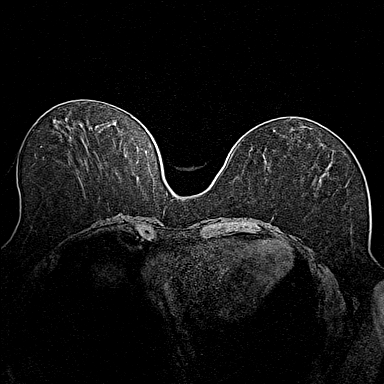
[im 108/144]
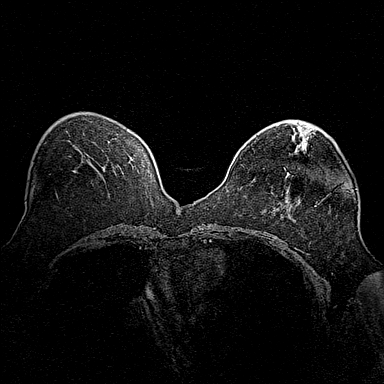
[im 144/144]
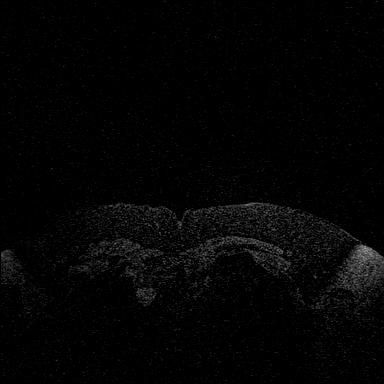

[Series 5: fl3d post-cm 20 · axial · 1.2mm · 0.94mm/px · z∈[-74,+98]mm · 5 of 144 slices shown (1 of 3)]
[im 1/144]
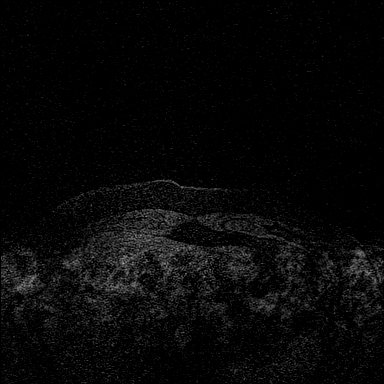
[im 36/144]
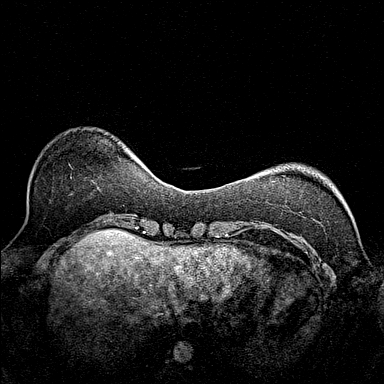
[im 72/144]
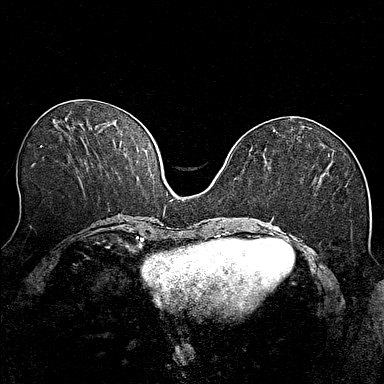
[im 108/144]
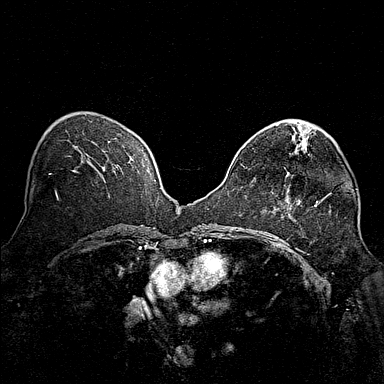
[im 144/144]
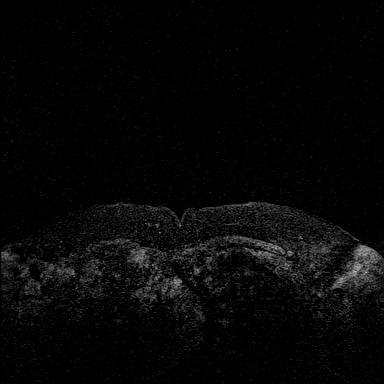

[Series 6: fl3d post-cm 20 · axial · 1.2mm · 0.94mm/px · z∈[-74,+98]mm · 5 of 144 slices shown (2 of 3)]
[im 1/144]
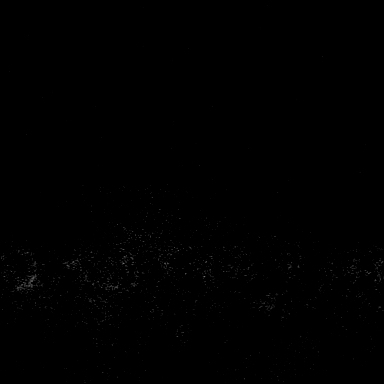
[im 36/144]
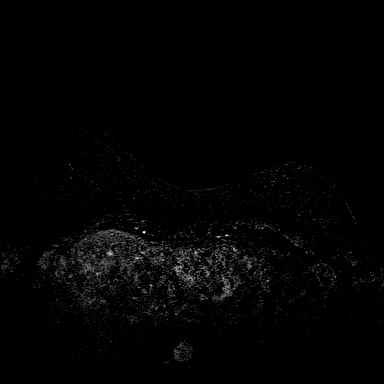
[im 72/144]
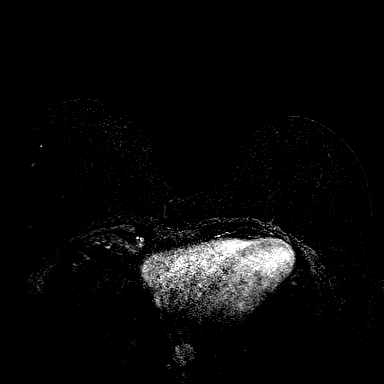
[im 108/144]
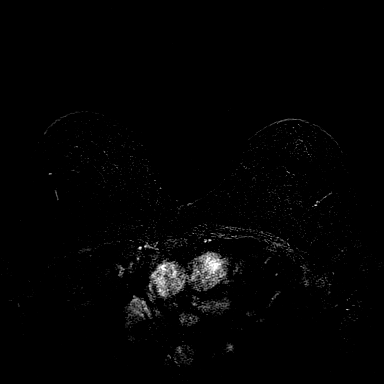
[im 144/144]
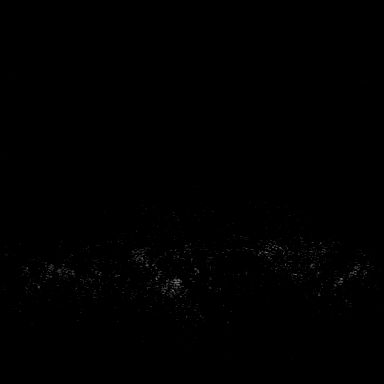

[Series 7: fl3d post-cm 20 · axial · 172.8mm · 0.94mm/px · 1 of 1 slices shown (3 of 3)]
[im 1/1]
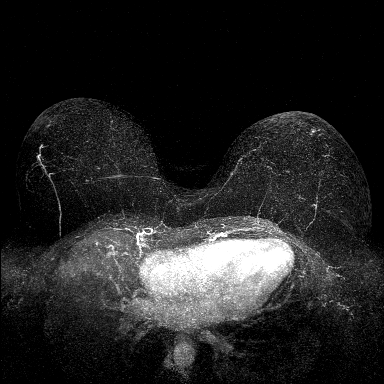

[Series 8: fl3d post-cm 3min · axial · 1.2mm · 0.94mm/px · z∈[-74,+98]mm · 6 of 144 slices shown]
[im 1/144]
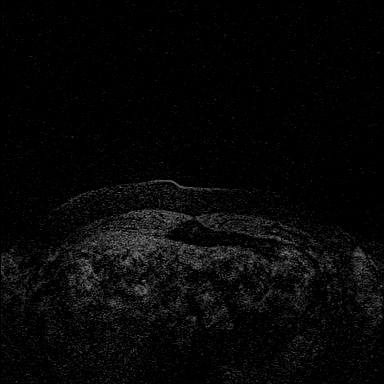
[im 29/144]
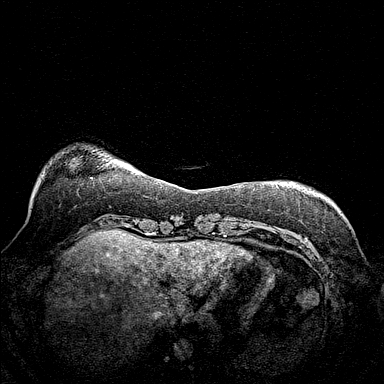
[im 58/144]
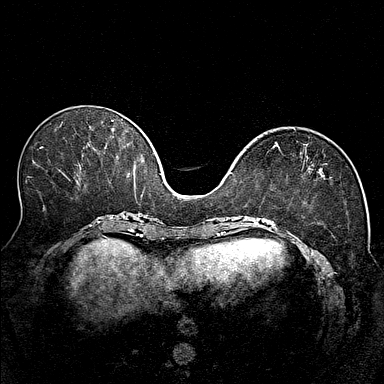
[im 86/144]
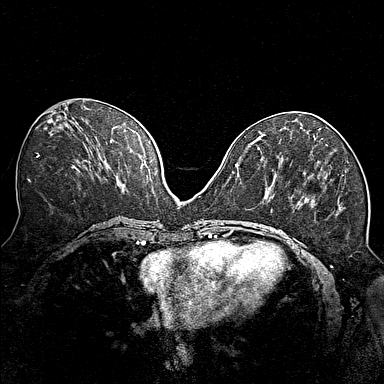
[im 115/144]
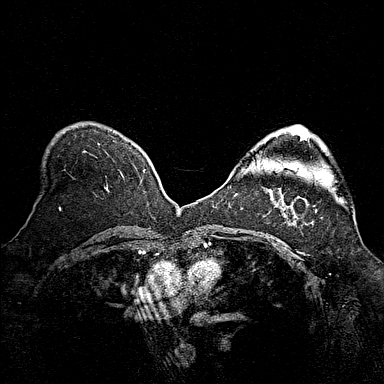
[im 144/144]
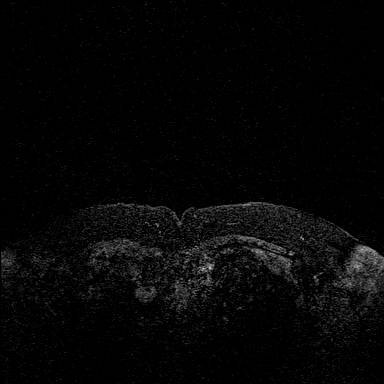

[Series 9: fl3d post-cm 3min_sub · axial · 1.2mm · 0.94mm/px · z∈[-74,+63]mm · 5 of 144 slices shown]
[im 1/144]
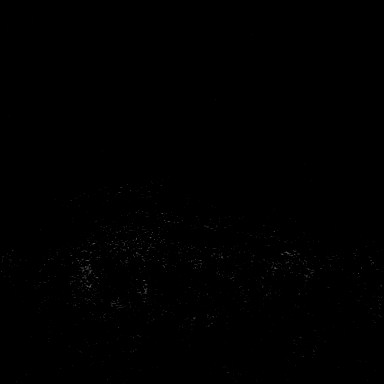
[im 29/144]
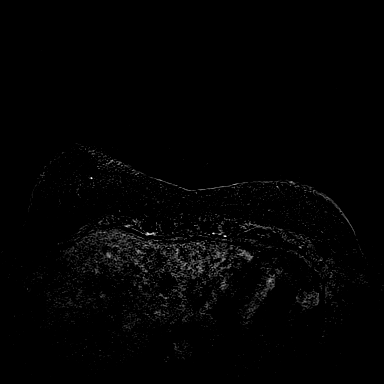
[im 58/144]
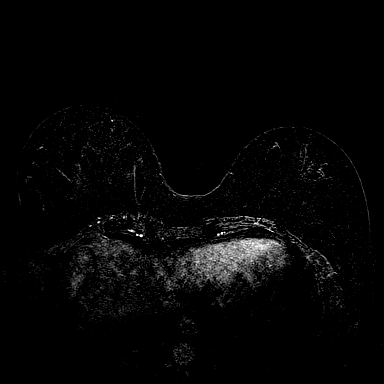
[im 86/144]
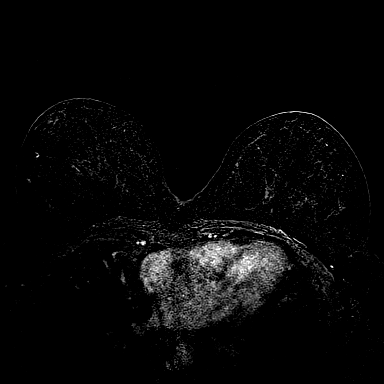
[im 115/144]
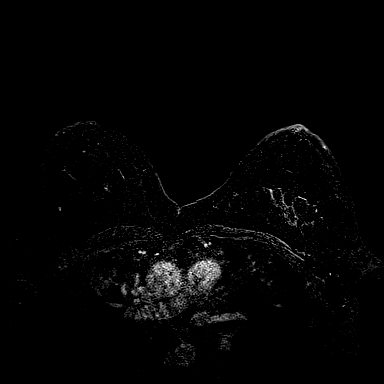

[33 of 48 positions shown; findings below may reference images not displayed]

Three-dimensional MR images were rendered by post-processing of the
original MR data on an independent workstation. The
three-dimensional MR images were interpreted, and findings are
reported in the following complete MRI report for this study. Three
dimensional images were evaluated at the independent interpreting
workstation using the DynaCAD thin client.
FINDINGS: Breast composition: b. Scattered fibroglandular tissue.

Background parenchymal enhancement: Mild

Right breast: No mass or abnormal enhancement.

Left breast: In the retroareolar left breast there is clumped non
mass enhancement measuring 0.9 x 0.5 x 0.7 cm (series 9, image 47),
which is located at the patient's surgical site. There are no
additional areas of abnormal enhancement in the left breast.

Lymph nodes: No abnormal appearing lymph nodes.

Ancillary findings:  None.
IMPRESSION: 1. Indeterminate clumped non mass enhancement measuring 0.9 cm in
the retroareolar LEFT breast at the patient's surgical site.
2. No MRI evidence of malignancy in the right breast.

RECOMMENDATION:
MRI guided biopsy of the non mass enhancement in the retroareolar
left breast.

BI-RADS CATEGORY  4: Suspicious.

## 2020-11-26 MED ORDER — GADOBUTROL 1 MMOL/ML IV SOLN
10.0000 mL | Freq: Once | INTRAVENOUS | Status: AC | PRN
  Administered 2020-11-26: 10 mL via INTRAVENOUS

## 2020-11-30 ENCOUNTER — Encounter: Payer: Self-pay | Admitting: Surgery

## 2020-11-30 ENCOUNTER — Other Ambulatory Visit: Payer: Self-pay | Admitting: Surgery

## 2020-11-30 DIAGNOSIS — R9389 Abnormal findings on diagnostic imaging of other specified body structures: Secondary | ICD-10-CM

## 2020-12-01 DIAGNOSIS — H15101 Unspecified episcleritis, right eye: Secondary | ICD-10-CM | POA: Diagnosis not present

## 2020-12-02 ENCOUNTER — Other Ambulatory Visit: Payer: Self-pay | Admitting: Internal Medicine

## 2020-12-09 ENCOUNTER — Other Ambulatory Visit: Payer: Self-pay | Admitting: Diagnostic Radiology

## 2020-12-09 ENCOUNTER — Ambulatory Visit
Admission: RE | Admit: 2020-12-09 | Discharge: 2020-12-09 | Disposition: A | Discharge status: Home / Self Care | Payer: BC Managed Care – PPO | Source: Ambulatory Visit | Attending: Surgery | Admitting: Surgery

## 2020-12-09 ENCOUNTER — Other Ambulatory Visit: Payer: Self-pay

## 2020-12-09 DIAGNOSIS — R9389 Abnormal findings on diagnostic imaging of other specified body structures: Secondary | ICD-10-CM

## 2020-12-09 DIAGNOSIS — N6012 Diffuse cystic mastopathy of left breast: Secondary | ICD-10-CM | POA: Diagnosis not present

## 2020-12-09 DIAGNOSIS — R928 Other abnormal and inconclusive findings on diagnostic imaging of breast: Secondary | ICD-10-CM | POA: Diagnosis not present

## 2020-12-09 IMAGING — MR MR BREAST BX W LOC DEV 1ST LESION IMAGE BX SPEC MR GUIDE*L*
7 of 10 series · 33 of 48 positions shown · IV contrast (7 ml gadavist)
Comparison: Previous exams.
COMPARISON: Previous exams.

Addendum:
CLINICAL DATA: 45-year-old female with indeterminate non mass
enhancement in the retroareolar left breast.

EXAM:
MRI GUIDED CORE NEEDLE BIOPSY OF THE LEFT BREAST
TECHNIQUE: Multiplanar, multisequence MR imaging of the left breast was
performed both before and after administration of intravenous
contrast.
CONTRAST:  7mL GADAVIST GADOBUTROL 1 MMOL/ML IV SOLN

[Series 2: fiducial unilateral · sagittal · 2.0mm · 1.33mm/px · 3 of 52 slices shown]
[im 1/52]
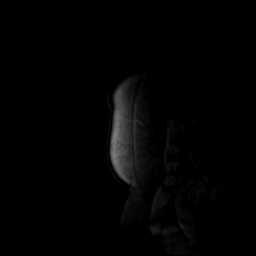
[im 26/52]
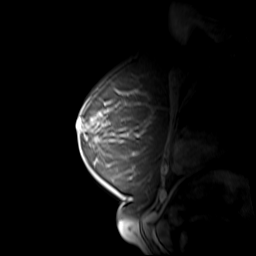
[im 52/52]
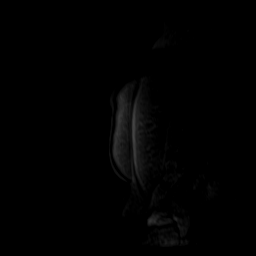

[Series 3: dynamic pre · axial · non-contrast · 1.3mm · 0.73mm/px · z∈[-94,+91]mm · 5 of 144 slices shown]
[im 1/144]
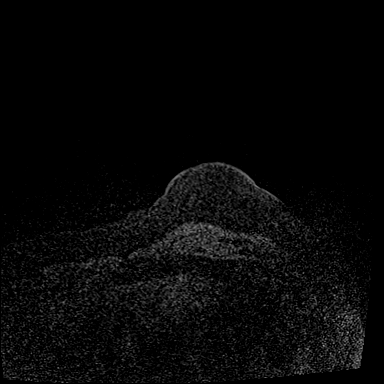
[im 36/144]
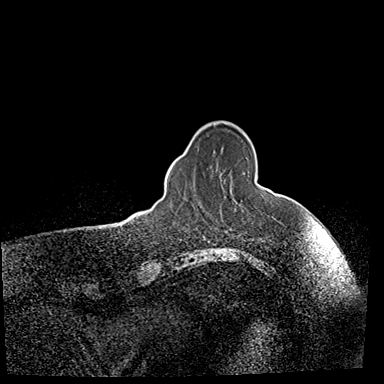
[im 72/144]
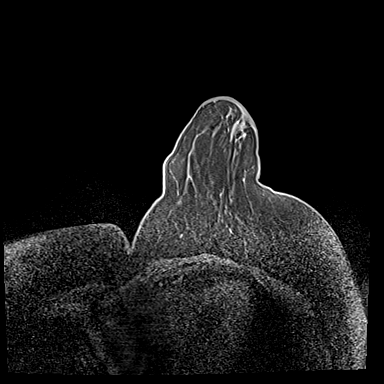
[im 108/144]
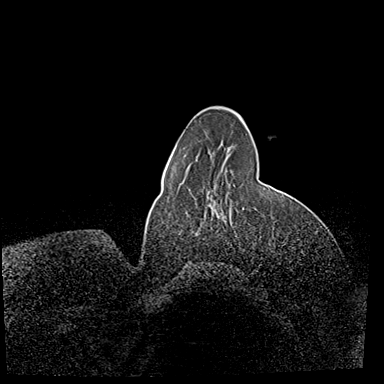
[im 144/144]
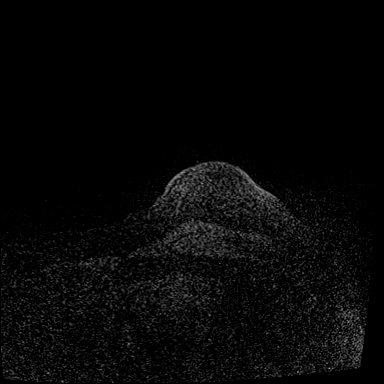

[Series 4: dynamic post 20 · axial · 1.3mm · 0.73mm/px · z∈[-94,+91]mm · 5 of 144 slices shown (1 of 2)]
[im 1/144]
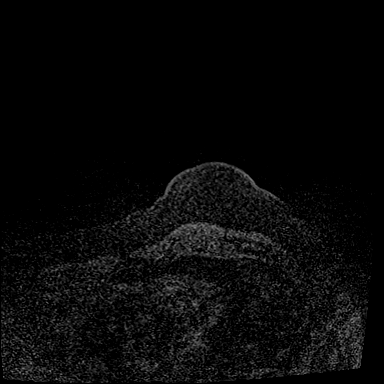
[im 36/144]
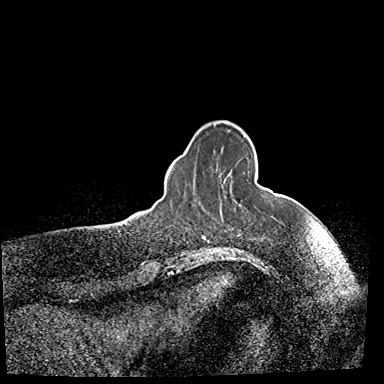
[im 72/144]
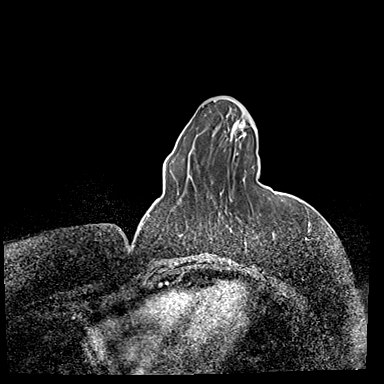
[im 108/144]
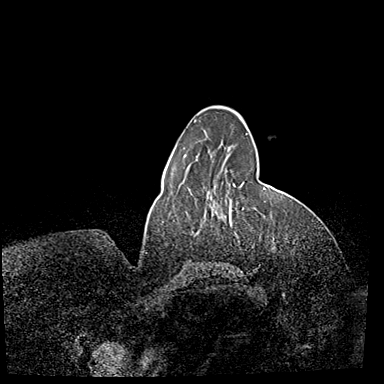
[im 144/144]
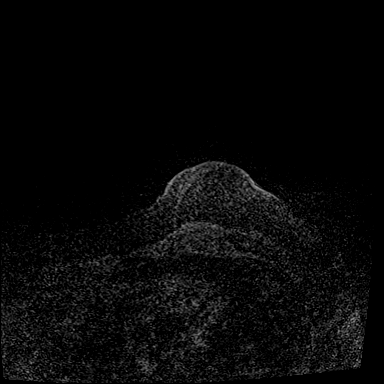

[Series 5: dynamic post 20 · axial · 1.3mm · 0.73mm/px · z∈[-94,+91]mm · 5 of 144 slices shown (2 of 2)]
[im 1/144]
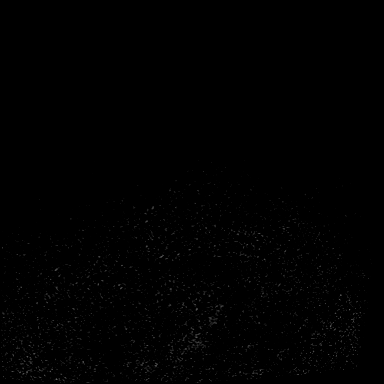
[im 36/144]
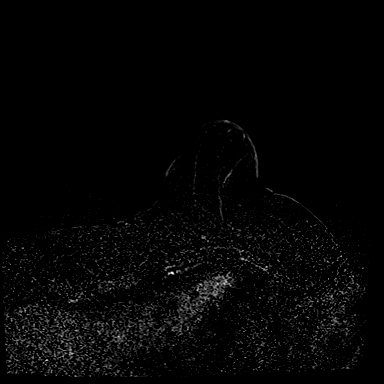
[im 72/144]
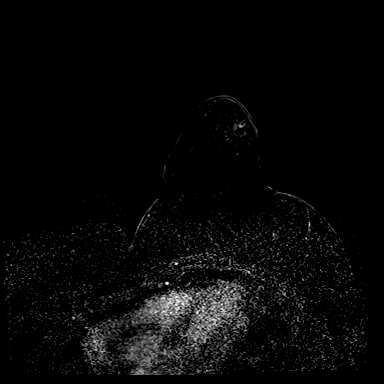
[im 108/144]
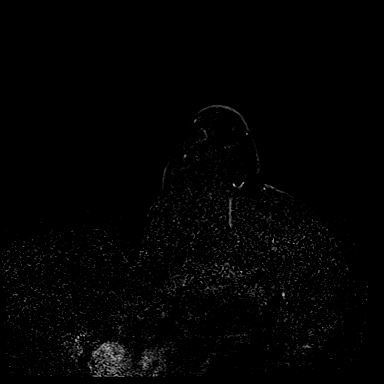
[im 144/144]
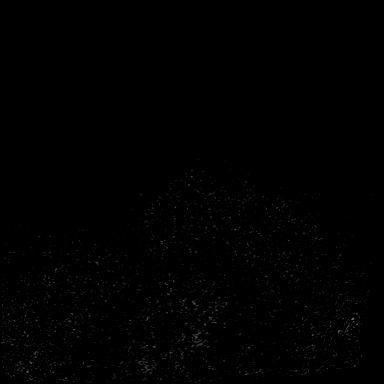

[Series 6: dynamic post 3 · axial · 1.3mm · 0.73mm/px · z∈[-94,+91]mm · 5 of 144 slices shown (1 of 2)]
[im 1/144]
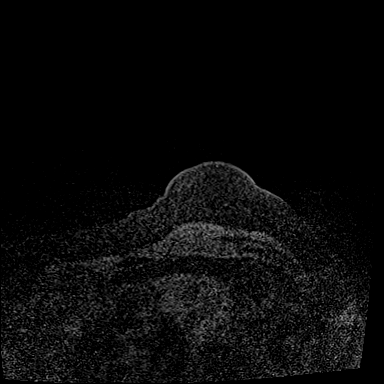
[im 36/144]
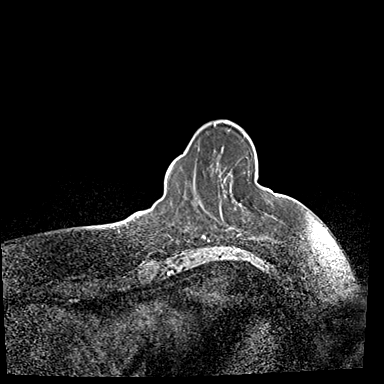
[im 72/144]
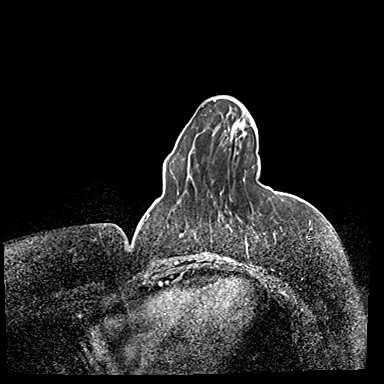
[im 108/144]
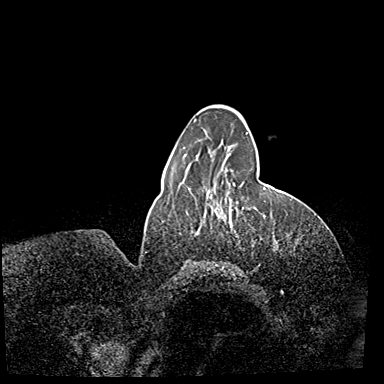
[im 144/144]
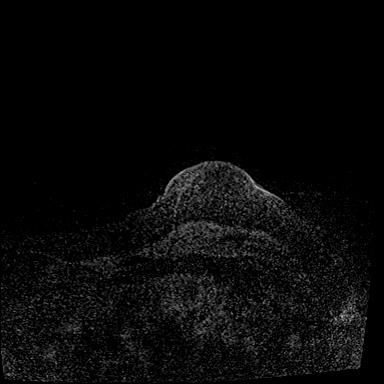

[Series 7: dynamic post 3 · axial · 1.3mm · 0.73mm/px · z∈[-94,+91]mm · 5 of 144 slices shown (2 of 2)]
[im 1/144]
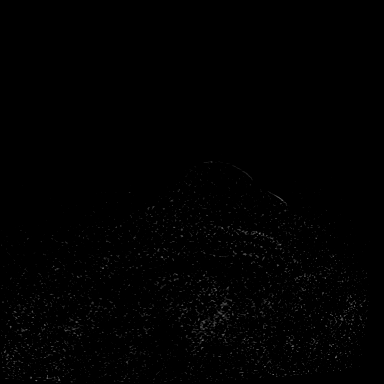
[im 36/144]
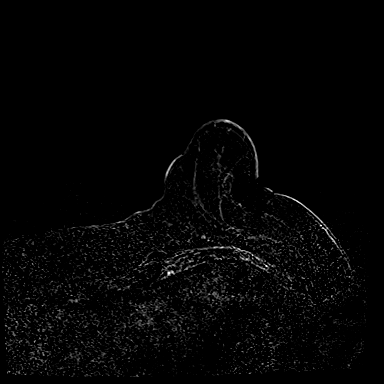
[im 72/144]
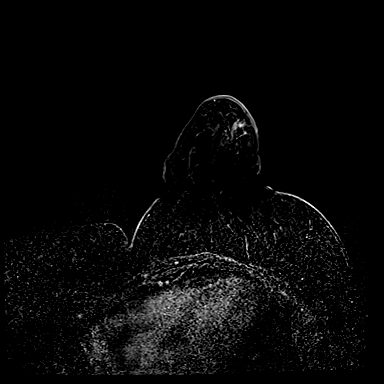
[im 108/144]
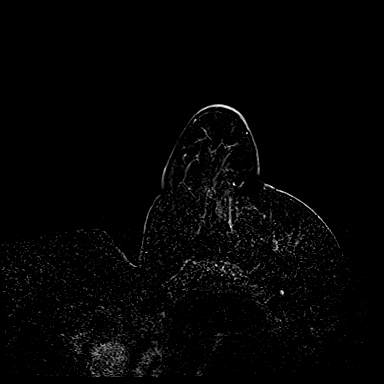
[im 144/144]
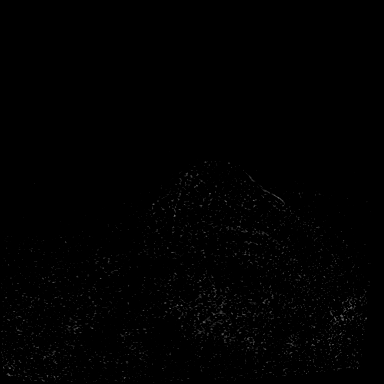

[Series 8: needle confirmation · axial · 1.3mm · 0.73mm/px · z∈[-94,+91]mm · 5 of 144 slices shown]
[im 1/144]
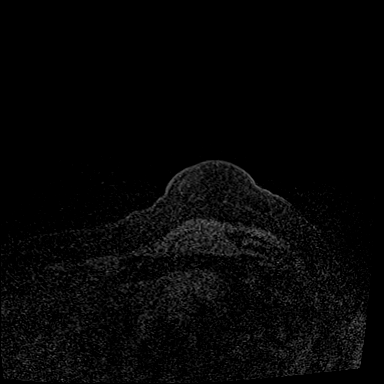
[im 36/144]
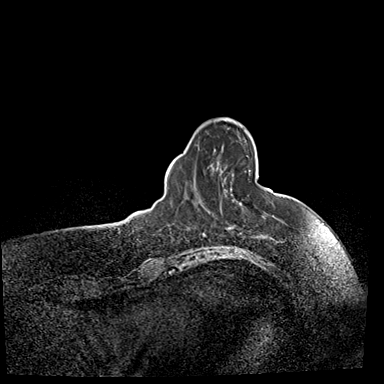
[im 72/144]
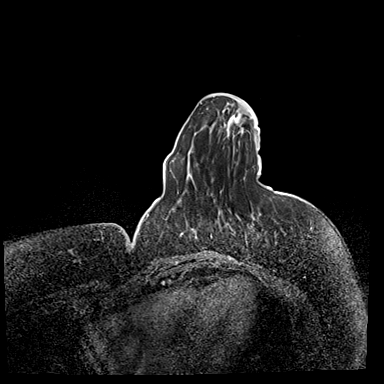
[im 108/144]
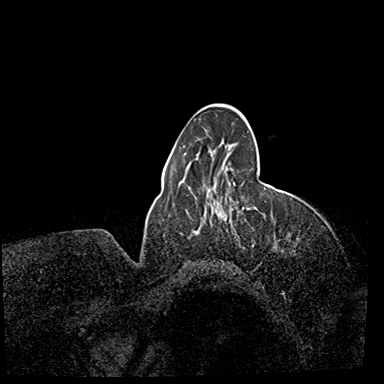
[im 144/144]
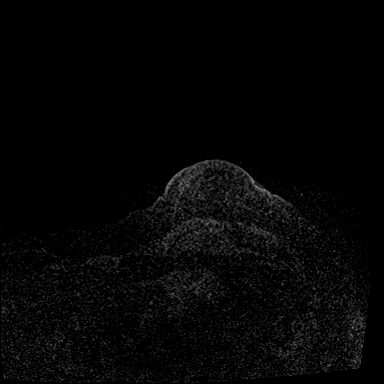

[33 of 48 positions shown; findings below may reference images not displayed]

FINDINGS: I met with the patient, and we discussed the procedure of MRI guided
biopsy, including risks, benefits, and alternatives. Specifically,
we discussed the risks of infection, bleeding, tissue injury, clip
migration, and inadequate sampling. Informed, written consent was
given. The usual time out protocol was performed immediately prior
to the procedure.

Using sterile technique, 1% Lidocaine, MRI guidance, and a 9 gauge
vacuum assisted device, biopsy was performed of in the retroareolar
left breast using a lateral approach. At the conclusion of the
procedure, a a barbell shaped tissue marker clip was deployed into
the biopsy cavity. Follow-up 2-view mammogram was performed and
dictated separately.
IMPRESSION: MRI guided biopsy of the left breast.  No apparent complications.

ADDENDUM:
Pathology revealed CHRONIC INFLAMMATION AND GIANT CELL REACTION of
the Left breast, retroareolar, (barbell clip). This was found to be
concordant by Dr. KINAVUIDY.

Pathology results were discussed with the patient by telephone. The
patient reported doing well after the biopsy with tenderness at the
site. Post biopsy instructions and care were reviewed and questions
were answered. The patient was encouraged to call The [REDACTED]

The patient was instructed to return for a bilateral breast MRI in 6
months, per protocol, and to continue with annual mammography.

Pathology results reported by KINAVUIDY, RN on [DATE].

*** End of Addendum ***
FINDINGS: I met with the patient, and we discussed the procedure of MRI guided
biopsy, including risks, benefits, and alternatives. Specifically,
we discussed the risks of infection, bleeding, tissue injury, clip
migration, and inadequate sampling. Informed, written consent was
given. The usual time out protocol was performed immediately prior
to the procedure.

Using sterile technique, 1% Lidocaine, MRI guidance, and a 9 gauge
vacuum assisted device, biopsy was performed of in the retroareolar
left breast using a lateral approach. At the conclusion of the
procedure, a a barbell shaped tissue marker clip was deployed into
the biopsy cavity. Follow-up 2-view mammogram was performed and
dictated separately.
IMPRESSION: MRI guided biopsy of the left breast.  No apparent complications.

## 2020-12-09 IMAGING — MG MM BREAST LOCALIZATION CLIP
4 series · 4 of 12 positions shown · non-contrast
Comparison: Previous exam(s).

CLINICAL DATA: Status post MRI guided biopsy of the left breast.

EXAM:
3D DIAGNOSTIC LEFT MAMMOGRAM POST MRI BIOPSY

[L CC synth-2D]
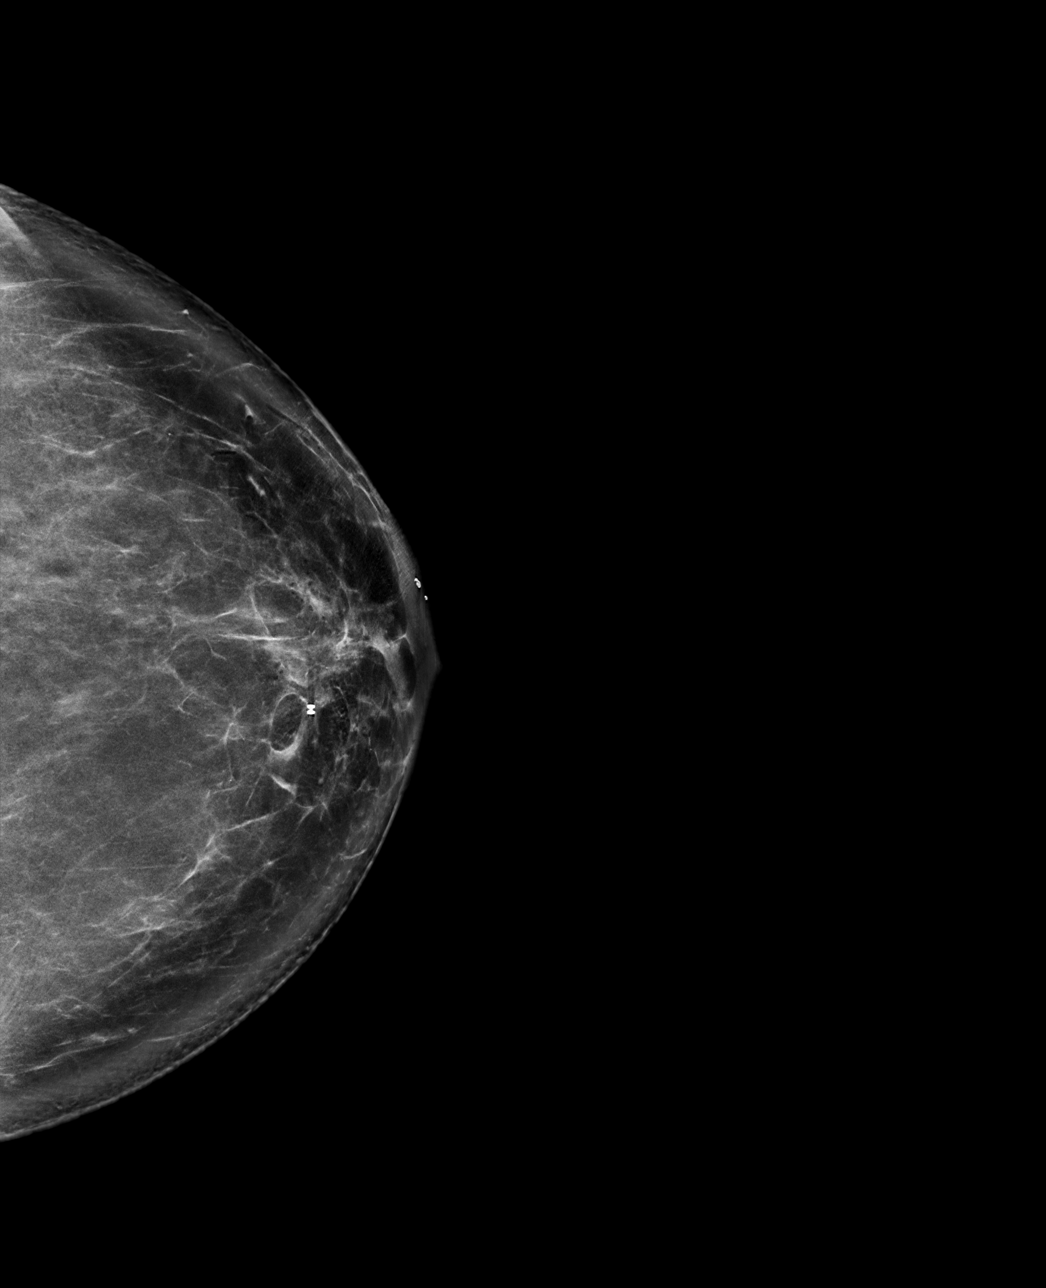

[L ML synth-2D]
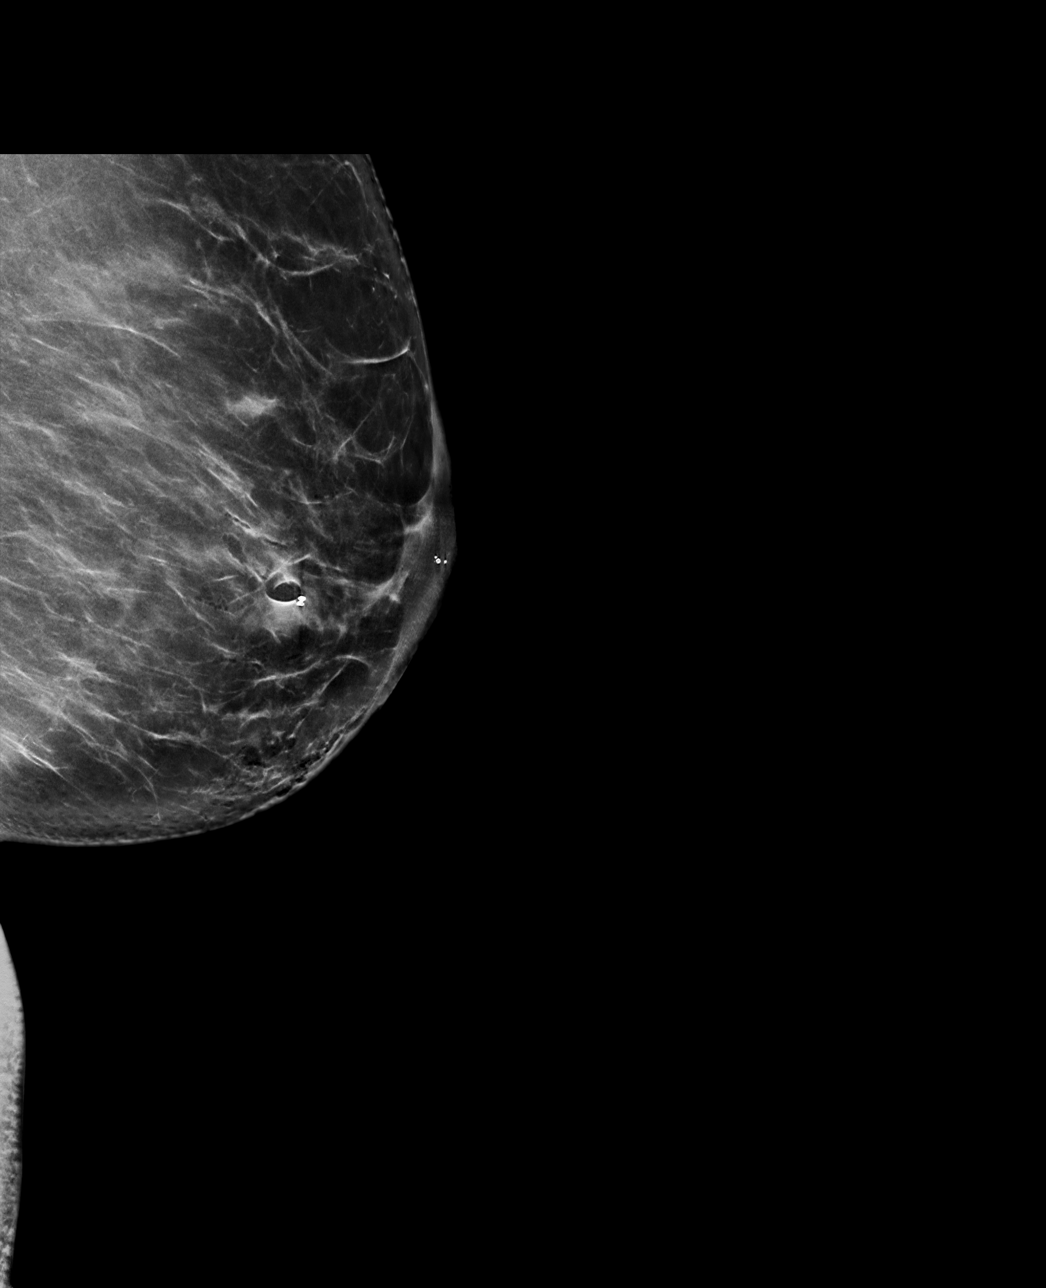

[L ML tomo · tomo slice 51/101.0]
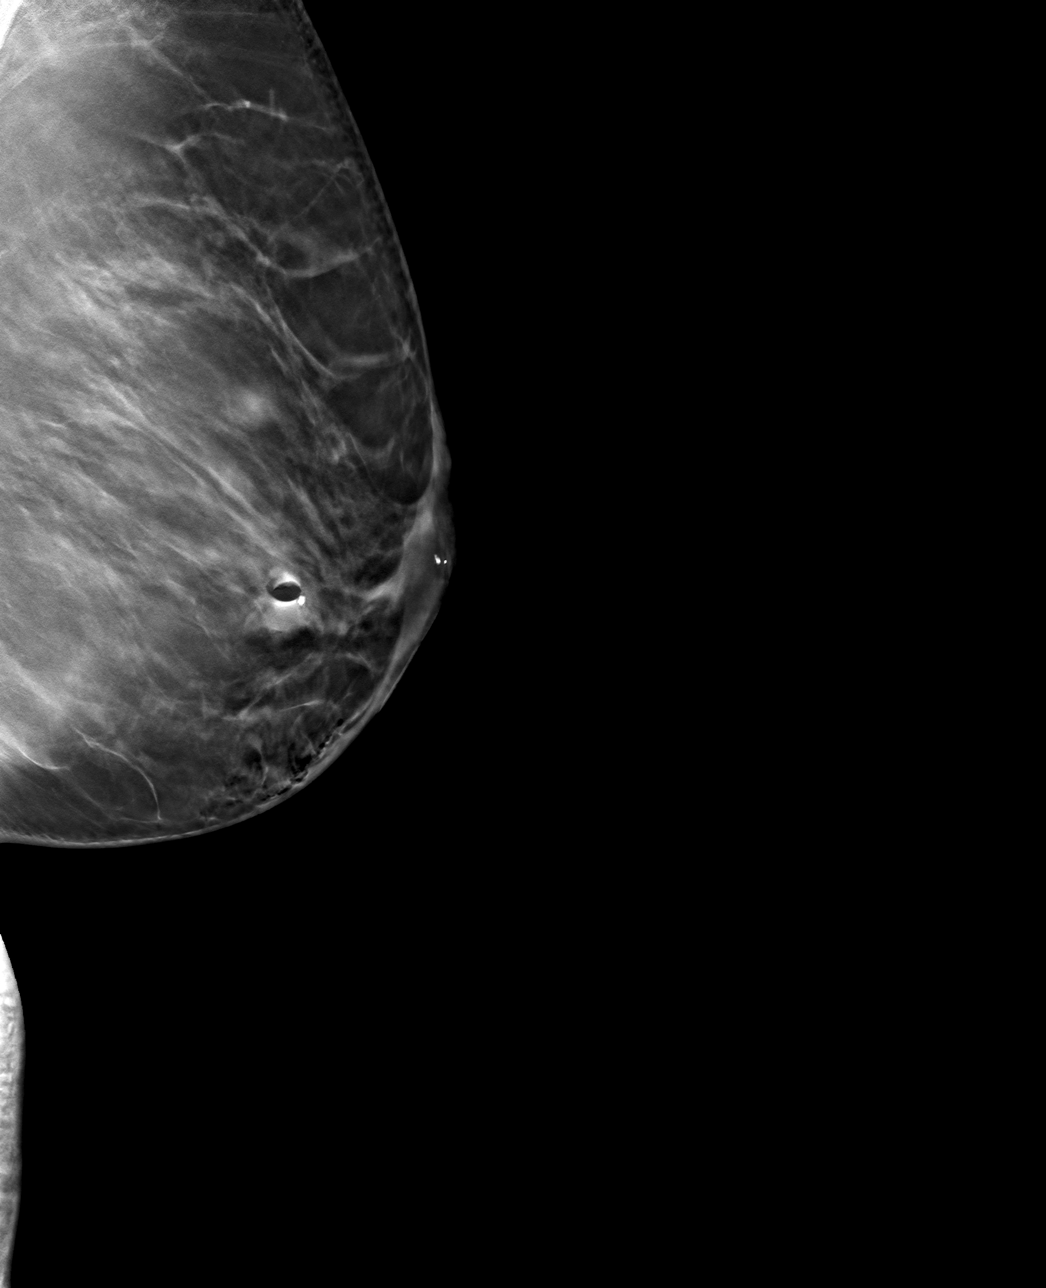

[L CC tomo · tomo slice 51/100.0]
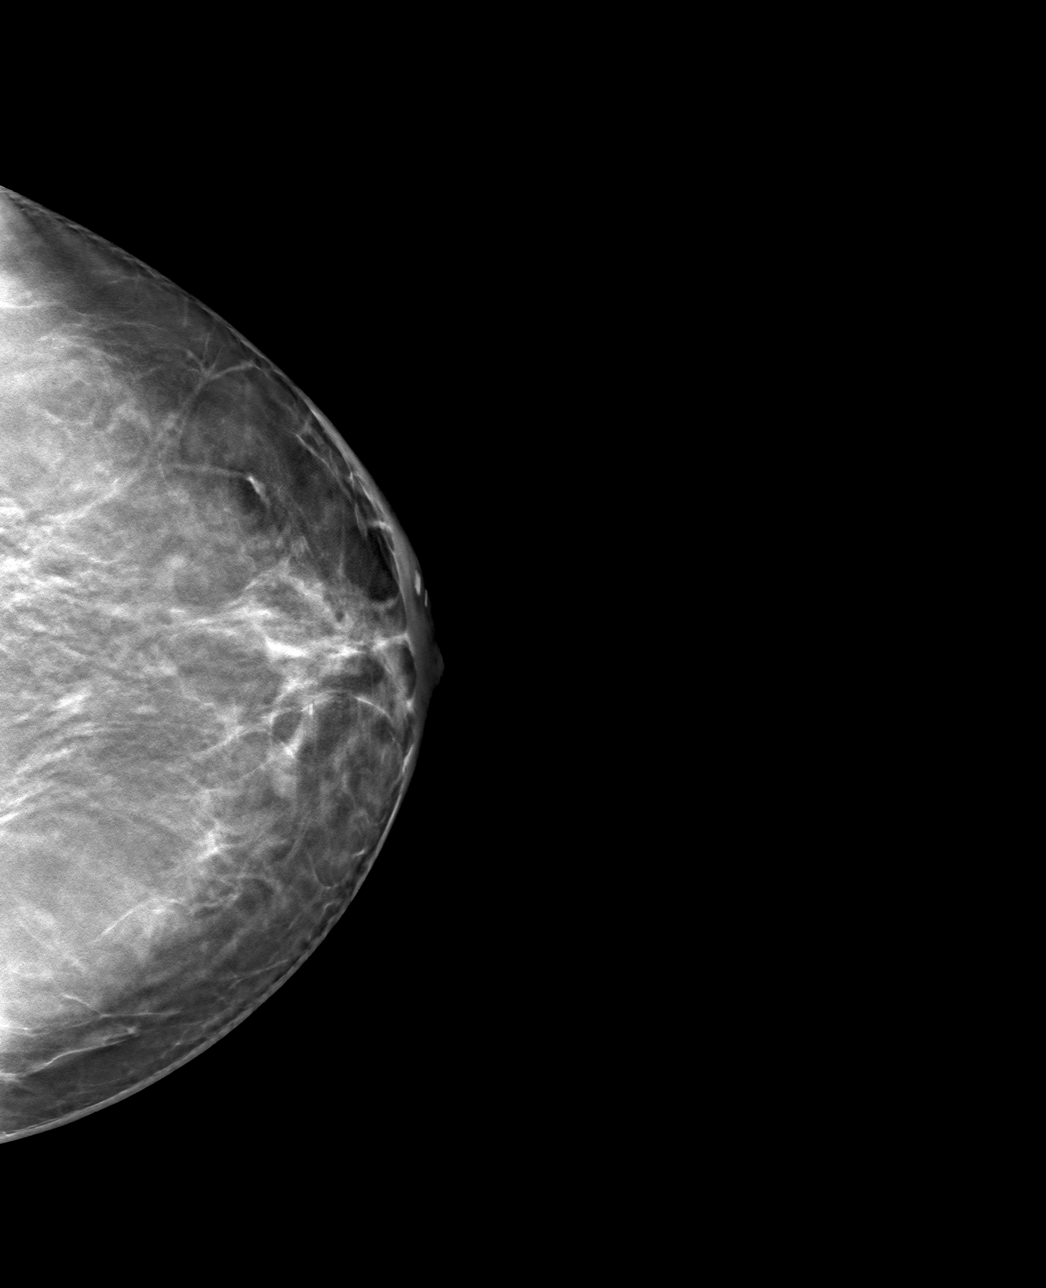

[4 of 12 positions shown; findings below may reference images not displayed]

FINDINGS: 3D Mammographic images were obtained following MRI guided biopsy of
of the retroareolar left breast. The biopsy clip is identified in
the slightly medial, retroareolar left breast. There [REDACTED]cm
medial migration relative to the target site.
IMPRESSION: Likely 1-2 cm medial migration of the clip relative to the targeted
site.

Final Assessment: Post Procedure Mammograms for Marker Placement

## 2020-12-09 MED ORDER — GADOBUTROL 1 MMOL/ML IV SOLN
7.0000 mL | Freq: Once | INTRAVENOUS | Status: AC | PRN
  Administered 2020-12-09: 7 mL via INTRAVENOUS

## 2020-12-13 DIAGNOSIS — L821 Other seborrheic keratosis: Secondary | ICD-10-CM | POA: Diagnosis not present

## 2020-12-13 DIAGNOSIS — D2261 Melanocytic nevi of right upper limb, including shoulder: Secondary | ICD-10-CM | POA: Diagnosis not present

## 2020-12-13 DIAGNOSIS — L578 Other skin changes due to chronic exposure to nonionizing radiation: Secondary | ICD-10-CM | POA: Diagnosis not present

## 2020-12-13 DIAGNOSIS — L719 Rosacea, unspecified: Secondary | ICD-10-CM | POA: Diagnosis not present

## 2021-01-30 ENCOUNTER — Other Ambulatory Visit: Payer: Self-pay | Admitting: Internal Medicine

## 2021-06-03 DIAGNOSIS — Z1231 Encounter for screening mammogram for malignant neoplasm of breast: Secondary | ICD-10-CM | POA: Diagnosis not present

## 2021-06-03 DIAGNOSIS — Z01419 Encounter for gynecological examination (general) (routine) without abnormal findings: Secondary | ICD-10-CM | POA: Diagnosis not present

## 2021-06-03 LAB — HM PAP SMEAR

## 2021-06-10 ENCOUNTER — Other Ambulatory Visit: Payer: Self-pay | Admitting: Internal Medicine

## 2021-06-15 LAB — HM PAP SMEAR: HPV, high-risk: NEGATIVE

## 2021-07-07 DIAGNOSIS — E785 Hyperlipidemia, unspecified: Secondary | ICD-10-CM | POA: Diagnosis not present

## 2021-07-07 DIAGNOSIS — I10 Essential (primary) hypertension: Secondary | ICD-10-CM | POA: Diagnosis not present

## 2021-07-07 DIAGNOSIS — Z1211 Encounter for screening for malignant neoplasm of colon: Secondary | ICD-10-CM | POA: Diagnosis not present

## 2021-07-15 ENCOUNTER — Other Ambulatory Visit: Payer: BC Managed Care – PPO | Admitting: Internal Medicine

## 2021-07-15 ENCOUNTER — Other Ambulatory Visit: Payer: Self-pay

## 2021-07-15 DIAGNOSIS — F32A Depression, unspecified: Secondary | ICD-10-CM | POA: Diagnosis not present

## 2021-07-15 DIAGNOSIS — E782 Mixed hyperlipidemia: Secondary | ICD-10-CM

## 2021-07-15 DIAGNOSIS — F419 Anxiety disorder, unspecified: Secondary | ICD-10-CM | POA: Diagnosis not present

## 2021-07-15 DIAGNOSIS — R7301 Impaired fasting glucose: Secondary | ICD-10-CM | POA: Diagnosis not present

## 2021-07-15 DIAGNOSIS — R5383 Other fatigue: Secondary | ICD-10-CM

## 2021-07-18 ENCOUNTER — Other Ambulatory Visit: Payer: Self-pay

## 2021-07-18 ENCOUNTER — Ambulatory Visit (INDEPENDENT_AMBULATORY_CARE_PROVIDER_SITE_OTHER): Payer: BC Managed Care – PPO | Admitting: Internal Medicine

## 2021-07-18 ENCOUNTER — Encounter: Payer: Self-pay | Admitting: Internal Medicine

## 2021-07-18 VITALS — BP 126/88 | HR 92 | Temp 99.8°F | Ht 67.5 in | Wt 197.0 lb

## 2021-07-18 DIAGNOSIS — Z6830 Body mass index (BMI) 30.0-30.9, adult: Secondary | ICD-10-CM | POA: Diagnosis not present

## 2021-07-18 DIAGNOSIS — Z0000 Encounter for general adult medical examination without abnormal findings: Secondary | ICD-10-CM

## 2021-07-18 DIAGNOSIS — Z8249 Family history of ischemic heart disease and other diseases of the circulatory system: Secondary | ICD-10-CM

## 2021-07-18 DIAGNOSIS — E782 Mixed hyperlipidemia: Secondary | ICD-10-CM

## 2021-07-18 DIAGNOSIS — F419 Anxiety disorder, unspecified: Secondary | ICD-10-CM

## 2021-07-18 DIAGNOSIS — F32A Depression, unspecified: Secondary | ICD-10-CM

## 2021-07-18 DIAGNOSIS — N6012 Diffuse cystic mastopathy of left breast: Secondary | ICD-10-CM

## 2021-07-18 DIAGNOSIS — Z803 Family history of malignant neoplasm of breast: Secondary | ICD-10-CM

## 2021-07-18 LAB — POCT URINALYSIS DIPSTICK
Bilirubin, UA: NEGATIVE
Blood, UA: NEGATIVE
Glucose, UA: NEGATIVE
Ketones, UA: NEGATIVE
Leukocytes, UA: NEGATIVE
Nitrite, UA: NEGATIVE
Protein, UA: NEGATIVE
Spec Grav, UA: 1.015 (ref 1.010–1.025)
Urobilinogen, UA: 0.2 E.U./dL
pH, UA: 6.5 (ref 5.0–8.0)

## 2021-07-18 MED ORDER — ROSUVASTATIN CALCIUM 10 MG PO TABS: 10.0000 mg | ORAL_TABLET | Freq: Every day | ORAL | 3 refills | Status: DC

## 2021-07-18 NOTE — Progress Notes (Unsigned)
° °  Subjective:    Patient ID: Lisa Golden, female    DOB: 1976-02-21, 46 y.o.   MRN: [id number]  HPI 46 year old Female    Review of Systems colonoscopy scheduled for next month also having reflux sx and having endoscopy     Objective:   Physical Exam        Assessment & Plan:

## 2021-07-18 NOTE — Addendum Note (Signed)
Addended by: Angus Seller on: 07/18/2021 03:16 PM ? ? Modules accepted: Orders ? ?

## 2021-07-20 ENCOUNTER — Telehealth: Payer: Self-pay | Admitting: Internal Medicine

## 2021-07-20 ENCOUNTER — Encounter: Payer: Self-pay | Admitting: Internal Medicine

## 2021-07-20 LAB — CBC WITH DIFFERENTIAL/PLATELET
Absolute Monocytes: 502 cells/uL (ref 200–950)
Basophils Absolute: 43 cells/uL (ref 0–200)
Basophils Relative: 0.7 %
Eosinophils Absolute: 62 cells/uL (ref 15–500)
Eosinophils Relative: 1 %
HCT: 38.9 % (ref 35.0–45.0)
Hemoglobin: 12.8 g/dL (ref 11.7–15.5)
Lymphs Abs: 2127 cells/uL (ref 850–3900)
MCH: 29.8 pg (ref 27.0–33.0)
MCHC: 32.9 g/dL (ref 32.0–36.0)
MCV: 90.5 fL (ref 80.0–100.0)
MPV: 11.5 fL (ref 7.5–12.5)
Monocytes Relative: 8.1 %
Neutro Abs: 3466 cells/uL (ref 1500–7800)
Neutrophils Relative %: 55.9 %
Platelets: 267 10*3/uL (ref 140–400)
RBC: 4.3 10*6/uL (ref 3.80–5.10)
RDW: 12.1 % (ref 11.0–15.0)
Total Lymphocyte: 34.3 %
WBC: 6.2 10*3/uL (ref 3.8–10.8)

## 2021-07-20 LAB — COMPLETE METABOLIC PANEL WITH GFR
AG Ratio: 1.5 (calc) (ref 1.0–2.5)
ALT: 14 U/L (ref 6–29)
AST: 15 U/L (ref 10–35)
Albumin: 4.2 g/dL (ref 3.6–5.1)
Alkaline phosphatase (APISO): 51 U/L (ref 31–125)
BUN: 8 mg/dL (ref 7–25)
CO2: 25 mmol/L (ref 20–32)
Calcium: 9.3 mg/dL (ref 8.6–10.2)
Chloride: 103 mmol/L (ref 98–110)
Creat: 0.75 mg/dL (ref 0.50–0.99)
Globulin: 2.8 g/dL (calc) (ref 1.9–3.7)
Glucose, Bld: 96 mg/dL (ref 65–99)
Potassium: 4.5 mmol/L (ref 3.5–5.3)
Sodium: 138 mmol/L (ref 135–146)
Total Bilirubin: 0.7 mg/dL (ref 0.2–1.2)
Total Protein: 7 g/dL (ref 6.1–8.1)
eGFR: 99 mL/min/{1.73_m2} (ref >=60)

## 2021-07-20 LAB — HEMOGLOBIN A1C W/OUT EAG: Hgb A1c MFr Bld: 5.8 % of total Hgb — ABNORMAL HIGH (ref <5.7)

## 2021-07-20 LAB — LIPID PANEL
Cholesterol: 190 mg/dL (ref <200)
HDL: 63 mg/dL (ref >=50)
LDL Cholesterol (Calc): 104 mg/dL (calc) — ABNORMAL HIGH
Non-HDL Cholesterol (Calc): 127 mg/dL (calc) (ref <130)
Total CHOL/HDL Ratio: 3 (calc) (ref <5.0)
Triglycerides: 131 mg/dL (ref <150)

## 2021-07-20 LAB — TSH: TSH: 1.53 mIU/L

## 2021-07-20 NOTE — Telephone Encounter (Signed)
I personally called and spoke with patient about elevated Hgb AIC of 5.8 % normal is less than 5.7%. Options include diet and exercise vs treatment with metformin. Has gained about 6 pounds since last CPE. Patient would like to try diet and exercise first and I suggest we repeat Hgb AIC in 3-4 months after this trial. MJB, MD ?

## 2021-07-27 ENCOUNTER — Emergency Department (HOSPITAL_BASED_OUTPATIENT_CLINIC_OR_DEPARTMENT_OTHER)
Admission: EM | Admit: 2021-07-27 | Discharge: 2021-07-27 | Disposition: A | Discharge status: Home / Self Care | Payer: BC Managed Care – PPO | Attending: Emergency Medicine | Admitting: Emergency Medicine

## 2021-07-27 ENCOUNTER — Emergency Department (HOSPITAL_BASED_OUTPATIENT_CLINIC_OR_DEPARTMENT_OTHER): Payer: BC Managed Care – PPO

## 2021-07-27 ENCOUNTER — Emergency Department (HOSPITAL_BASED_OUTPATIENT_CLINIC_OR_DEPARTMENT_OTHER): Payer: BC Managed Care – PPO | Admitting: Radiology

## 2021-07-27 ENCOUNTER — Other Ambulatory Visit: Payer: Self-pay

## 2021-07-27 ENCOUNTER — Encounter (HOSPITAL_BASED_OUTPATIENT_CLINIC_OR_DEPARTMENT_OTHER): Payer: Self-pay

## 2021-07-27 DIAGNOSIS — H5702 Anisocoria: Secondary | ICD-10-CM | POA: Insufficient documentation

## 2021-07-27 DIAGNOSIS — H0289 Other specified disorders of eyelid: Secondary | ICD-10-CM | POA: Diagnosis not present

## 2021-07-27 DIAGNOSIS — H02401 Unspecified ptosis of right eyelid: Secondary | ICD-10-CM | POA: Diagnosis not present

## 2021-07-27 DIAGNOSIS — G902 Horner's syndrome: Secondary | ICD-10-CM | POA: Diagnosis not present

## 2021-07-27 DIAGNOSIS — R079 Chest pain, unspecified: Secondary | ICD-10-CM

## 2021-07-27 DIAGNOSIS — R0789 Other chest pain: Secondary | ICD-10-CM | POA: Insufficient documentation

## 2021-07-27 DIAGNOSIS — H21561 Pupillary abnormality, right eye: Secondary | ICD-10-CM | POA: Diagnosis not present

## 2021-07-27 DIAGNOSIS — N9489 Other specified conditions associated with female genital organs and menstrual cycle: Secondary | ICD-10-CM | POA: Diagnosis not present

## 2021-07-27 DIAGNOSIS — R000 Tachycardia, unspecified: Secondary | ICD-10-CM | POA: Diagnosis not present

## 2021-07-27 LAB — TROPONIN I (HIGH SENSITIVITY): Troponin I (High Sensitivity): <2 ng/L (ref <18)

## 2021-07-27 LAB — CBC
HCT: 38 % (ref 36.0–46.0)
Hemoglobin: 12.4 g/dL (ref 12.0–15.0)
MCH: 29.6 pg (ref 26.0–34.0)
MCHC: 32.6 g/dL (ref 30.0–36.0)
MCV: 90.7 fL (ref 80.0–100.0)
Platelets: 293 10*3/uL (ref 150–400)
RBC: 4.19 MIL/uL (ref 3.87–5.11)
RDW: 12.6 % (ref 11.5–15.5)
WBC: 6.6 10*3/uL (ref 4.0–10.5)
nRBC: 0 % (ref 0.0–0.2)

## 2021-07-27 LAB — BASIC METABOLIC PANEL
Anion gap: 9 (ref 5–15)
BUN: 10 mg/dL (ref 6–20)
CO2: 23 mmol/L (ref 22–32)
Calcium: 9.3 mg/dL (ref 8.9–10.3)
Chloride: 104 mmol/L (ref 98–111)
Creatinine, Ser: 0.82 mg/dL (ref 0.44–1.00)
GFR, Estimated: >60 mL/min (ref >60)
Glucose, Bld: 115 mg/dL — ABNORMAL HIGH (ref 70–99)
Potassium: 3.7 mmol/L (ref 3.5–5.1)
Sodium: 136 mmol/L (ref 135–145)

## 2021-07-27 LAB — HCG, SERUM, QUALITATIVE: Preg, Serum: NEGATIVE

## 2021-07-27 IMAGING — CT CT ANGIO HEAD-NECK (W OR W/O PERF)
2 of 11 series · 7 of 33 positions shown · non-contrast
Comparison: None.

CLINICAL DATA: Nonreactive right pupil, concern for PAULUS N
syndrome

EXAM:
CT ANGIOGRAPHY HEAD AND NECK
TECHNIQUE: Multidetector CT imaging of the head and neck was performed using
the standard protocol during bolus administration of intravenous
contrast. Multiplanar CT image reconstructions and MIPs were
obtained to evaluate the vascular anatomy. Carotid stenosis
measurements (when applicable) are obtained utilizing NASCET
criteria, using the distal internal carotid diameter as the
denominator.

[Series 8: cta head · axial · 0.44mm/px · z∈[-155,-41]mm · 2 of 171 slices shown]
[im 57/171  soft-tissue]
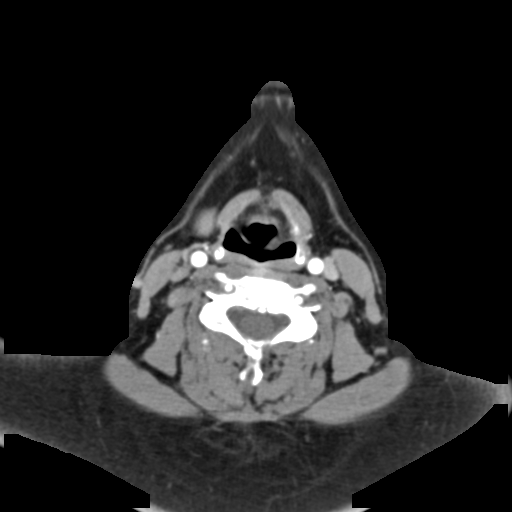
[im 114/171  soft-tissue]
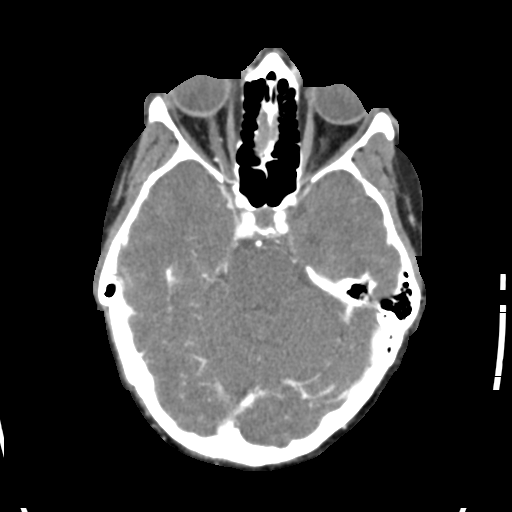

[Series 10: ax thin · axial · 0.49mm/px · z∈[-208,+14]mm · 5 of 334 slices shown]
[im 56/334  soft-tissue]
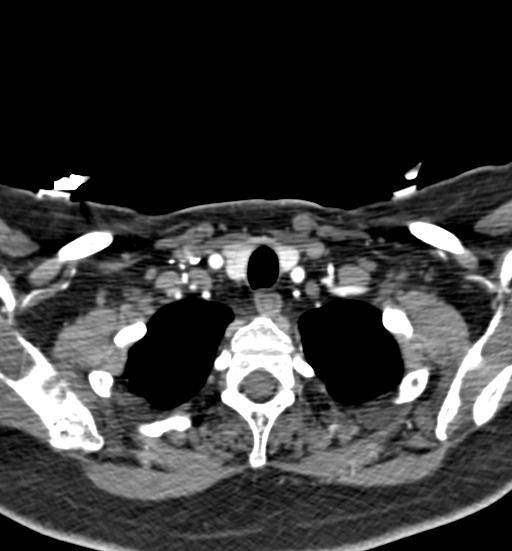
[im 112/334  bone]
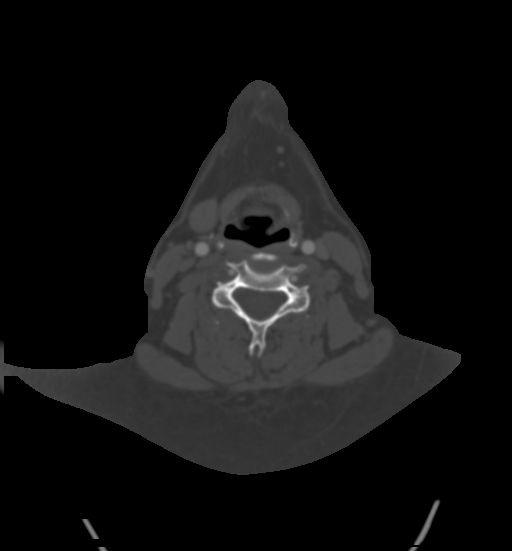
[im 167/334  soft-tissue]
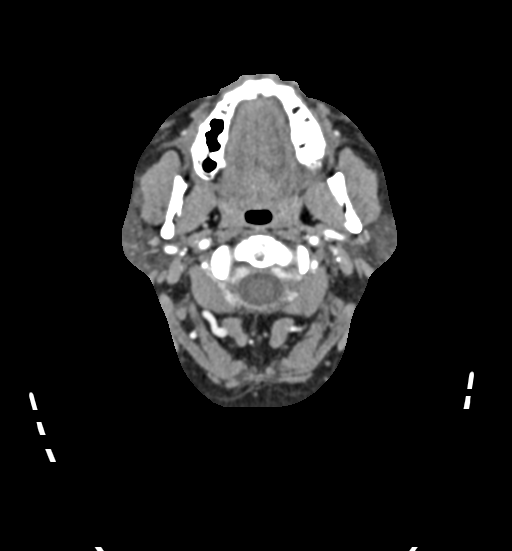
[im 223/334  bone]
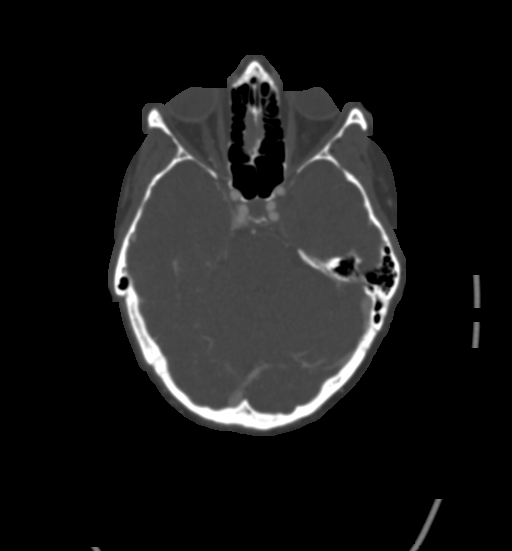
[im 278/334  soft-tissue]
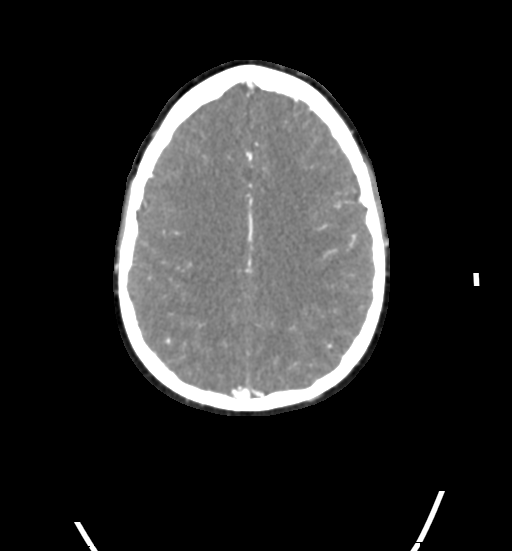

[7 of 33 positions shown; findings below may reference images not displayed]

RADIATION DOSE REDUCTION: This exam was performed according to the
departmental dose-optimization program which includes automated
exposure control, adjustment of the mA and/or kV according to
patient size and/or use of iterative reconstruction technique.

CONTRAST:  75mL OMNIPAQUE IOHEXOL 350 MG/ML SOLN
FINDINGS: CT HEAD FINDINGS

Brain: There is no evidence of acute intracranial hemorrhage,
extra-axial fluid collection, or acute infarct

Parenchymal volume is normal. The ventricles are normal in size.
Gray-white differentiation is preserved.

There is no mass lesion.  There is no mass effect or midline shift.

Vascular: See below.

Skull: Normal. Negative for fracture or focal lesion.

Sinuses and orbits: The paranasal sinuses are clear. The globes and
orbits are unremarkable.

Other: None.

Review of the MIP images confirms the above findings

CTA NECK FINDINGS

Aortic arch: The aortic arch is normal. The left vertebral artery
arises directly from the aortic arch, a normal variant. The origins
of the major branch vessels are patent. The subclavian arteries are
patent to the level imaged.

Right carotid system: The right common, internal, and external
carotid arteries are patent, without hemodynamically significant
stenosis or occlusion. There is no dissection or aneurysm.

Left carotid system: The left common, internal, and external carotid
arteries are patent, without hemodynamically significant stenosis or
occlusion. There is no dissection or aneurysm.

Vertebral arteries: The vertebral arteries are patent, without
hemodynamically significant stenosis or occlusion. There is no
dissection or aneurysm.

Skeleton: There is no acute osseous abnormality or aggressive
osseous lesion. There is no visible canal hematoma.

Other neck: Soft tissues are unremarkable.

Upper chest: The imaged lung apices are clear.

Review of the MIP images confirms the above findings

CTA HEAD FINDINGS

Anterior circulation: Intracranial ICAs are patent.

The bilateral MCAs are patent.

The bilateral ACAs are patent. The anterior communicating artery is
normal.

There is no aneurysm or AVM.

Posterior circulation: The bilateral V4 segments are patent. PICA
origin is identified on the left and not definitely seen on the
right. The basilar artery is patent.

The bilateral PCAs are patent. There is a fetal origin of the left
PCA. A small right posterior communicating artery is also seen.

There is no aneurysm or AVM.

Venous sinuses: Patent.

Anatomic variants: As above.

Review of the MIP images confirms the above findings
IMPRESSION: Normal CT/CTA of the head and neck.

## 2021-07-27 IMAGING — DX DG CHEST 2V
2 series · 2 of 2 positions shown · non-contrast
Comparison: None.

CLINICAL DATA: LEE WUI KIT syndrome

EXAM:
CHEST - 2 VIEW

[chest pa]
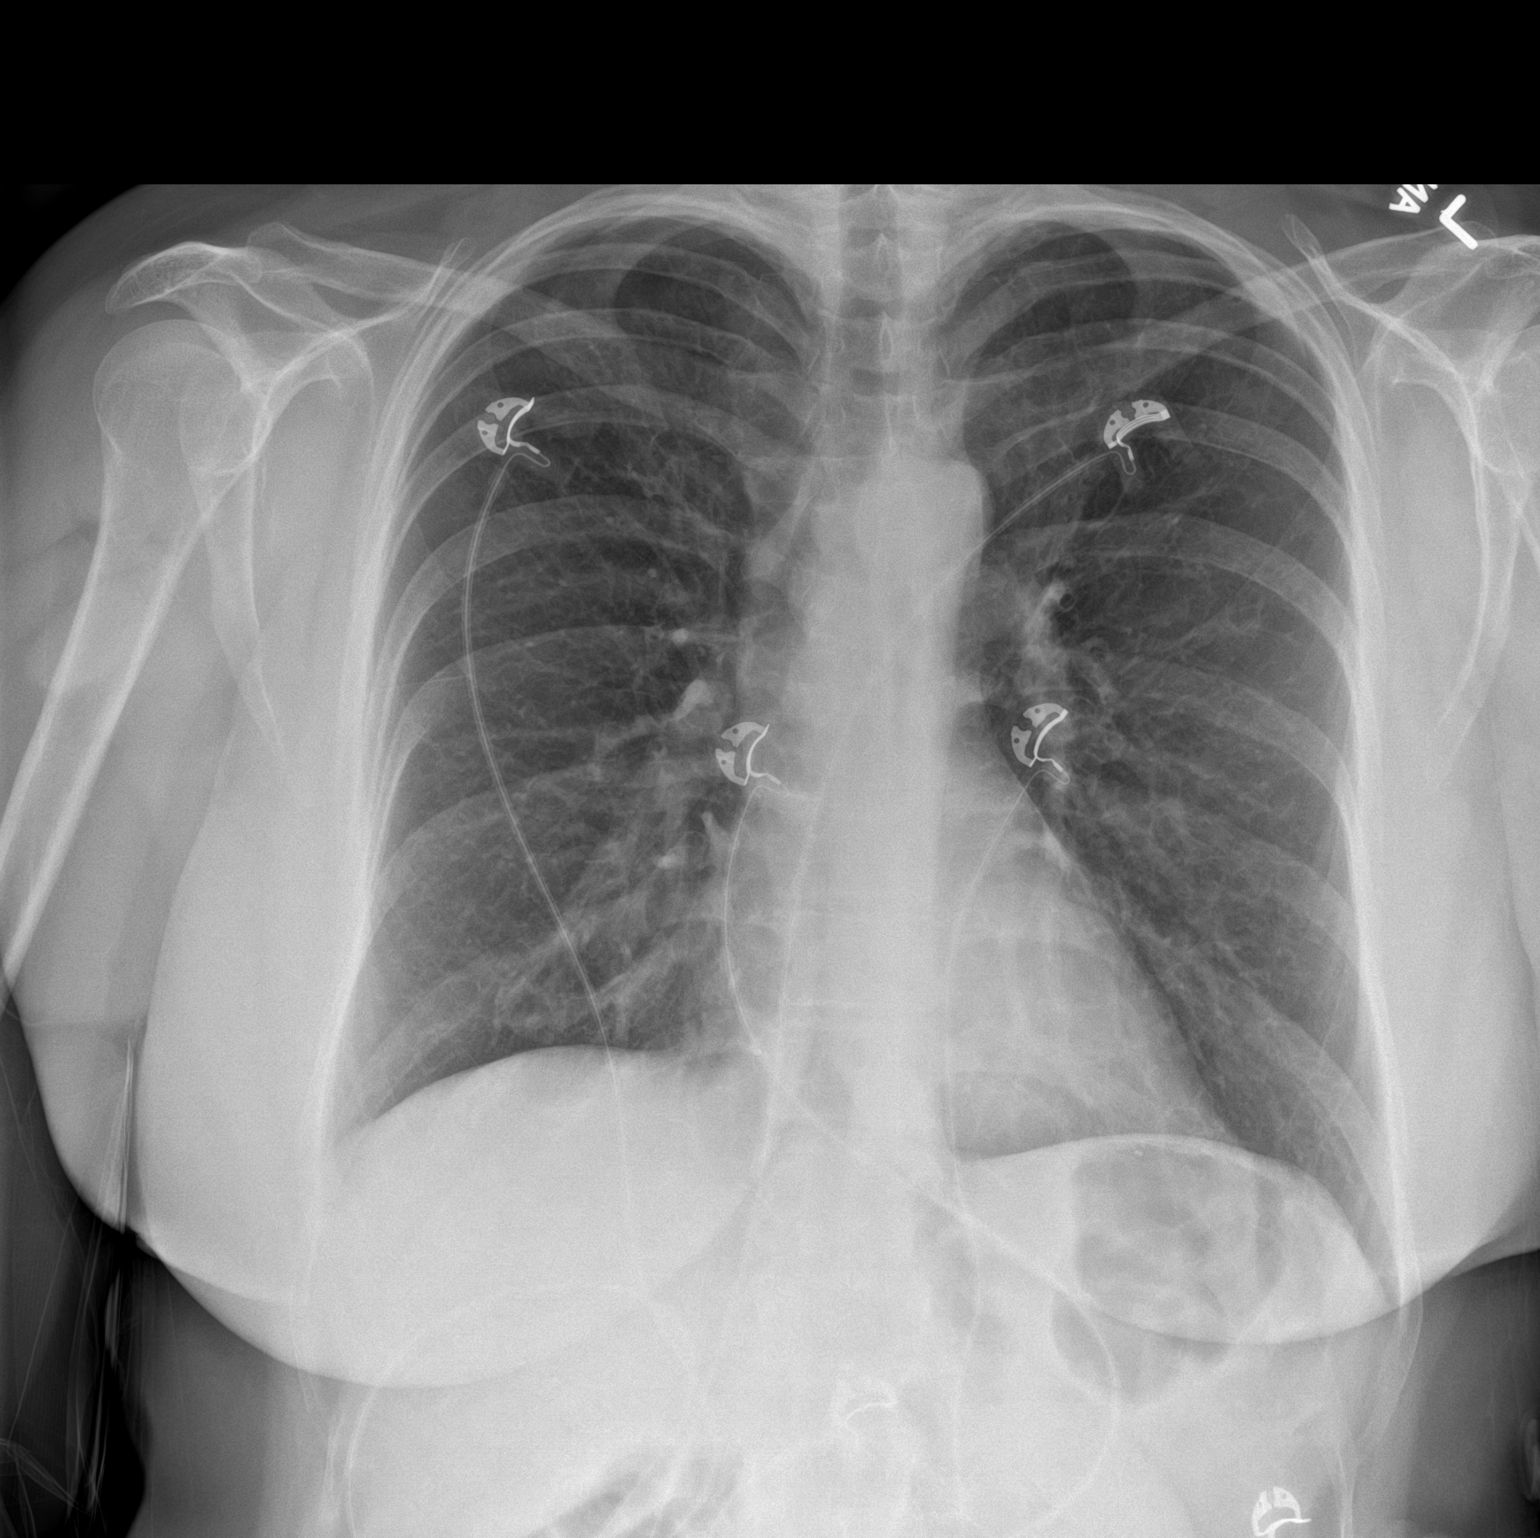

[chest lat]
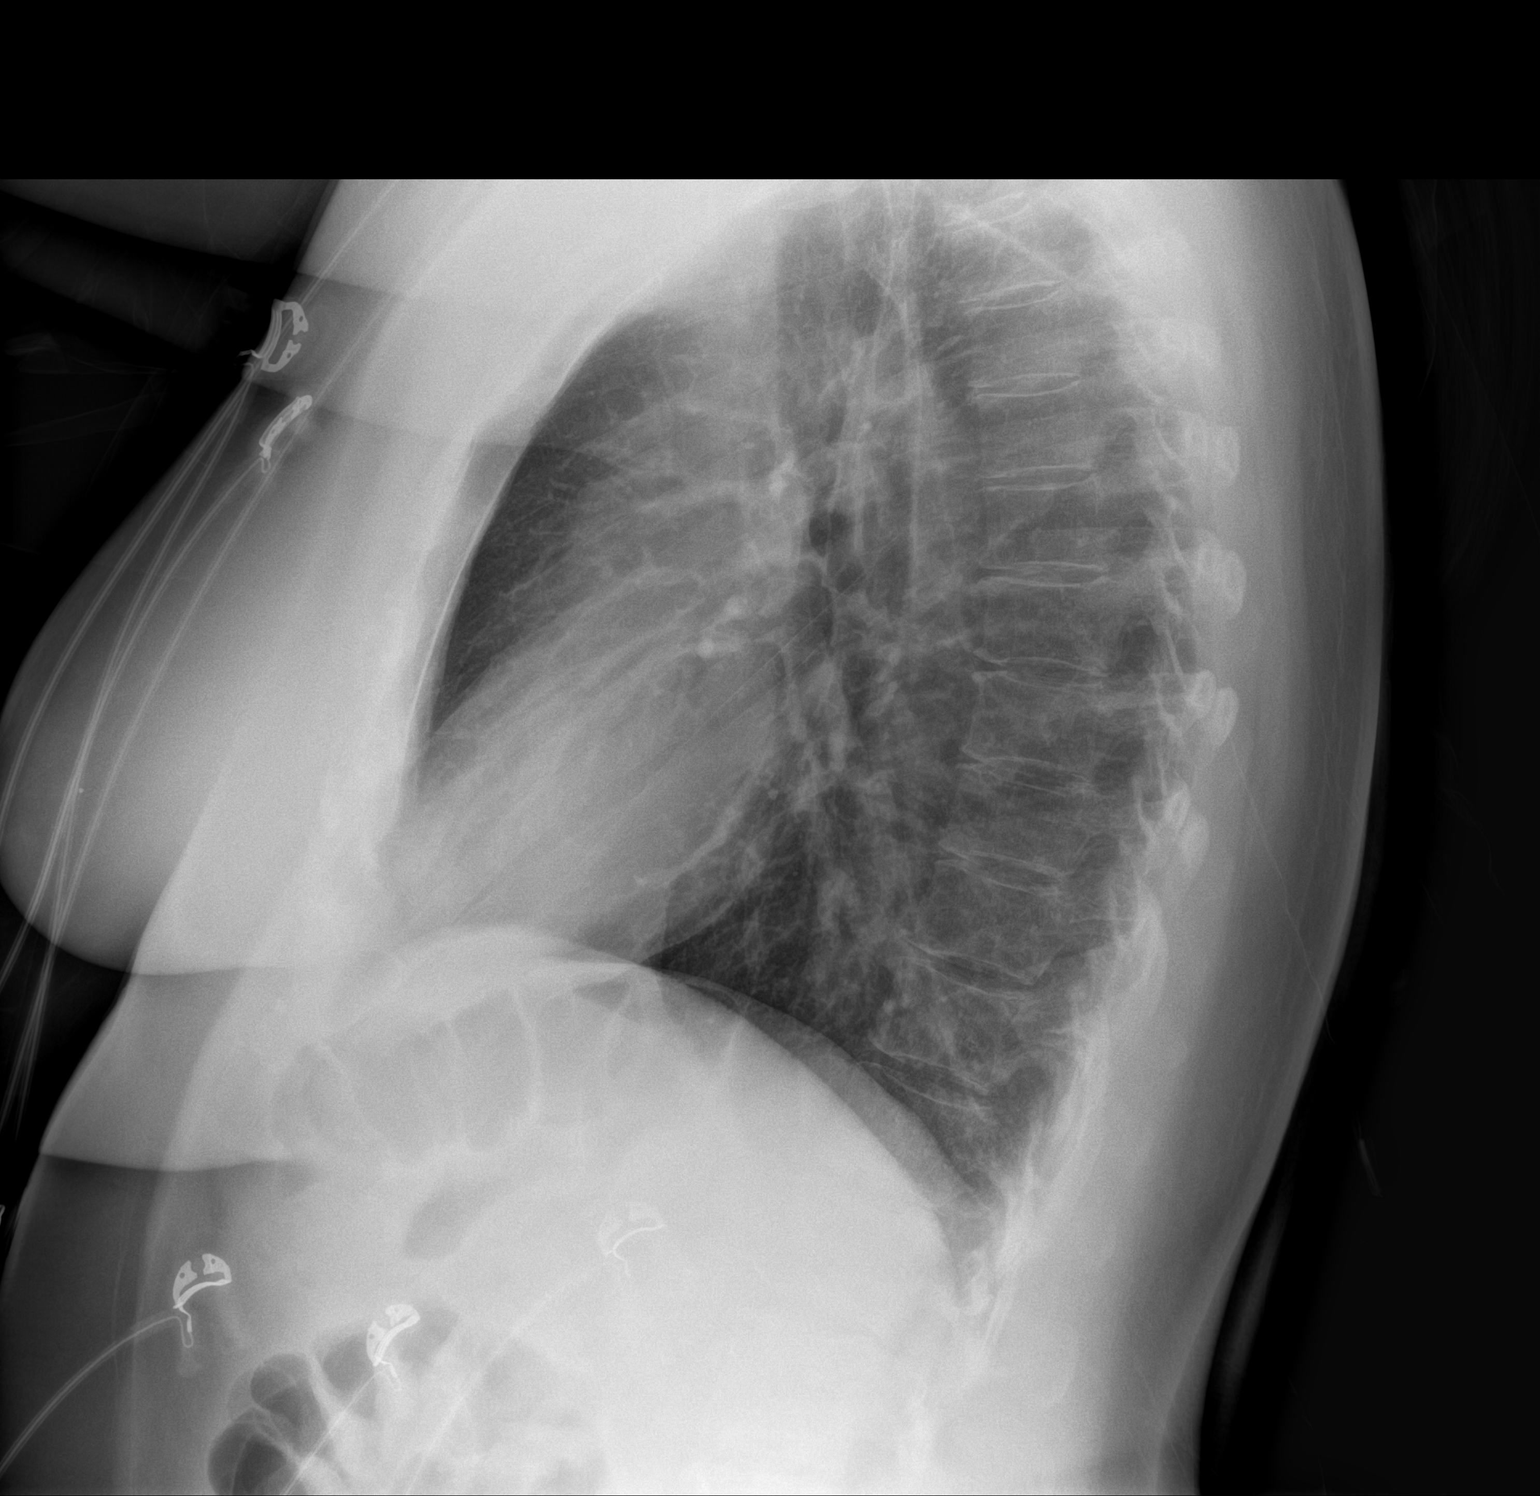

[2 of 2 positions shown; findings below may reference images not displayed]

FINDINGS: The cardiomediastinal silhouette is normal.

There is no focal consolidation or pulmonary edema. There is no
pleural effusion or pneumothorax

There is no acute osseous abnormality.
IMPRESSION: No radiographic evidence of acute cardiopulmonary process.

## 2021-07-27 MED ORDER — IOHEXOL 350 MG/ML SOLN
100.0000 mL | Freq: Once | INTRAVENOUS | Status: AC | PRN
  Administered 2021-07-27: 75 mL via INTRAVENOUS

## 2021-07-27 MED ORDER — ALPRAZOLAM 0.5 MG PO TABS: 0.5000 mg | ORAL_TABLET | Freq: Three times a day (TID) | ORAL | 0 refills | Status: DC | PRN

## 2021-07-27 NOTE — ED Notes (Signed)
Patient verbalizes understanding of discharge instructions. Opportunity for questioning and answers were provided. Patient discharged from ED.  °

## 2021-07-27 NOTE — ED Provider Notes (Addendum)
[facility] EMERGENCY DEPT Provider Note   CSN: [id number] Arrival date & time: 07/27/21  1111     History  Chief Complaint  Patient presents with   Eye Problem    Lisa Golden is a 46 y.o. female.   Eye Problem  46 year old female presenting to the emergency department with concern for potential Horner syndrome.  The patient states that she has had drooping of her right eyelid "for years."  She had taken her father to surgery with ophthalmology and an ophthalmologist recommended that she get evaluated in the emergency department because there was some concern for Horner syndrome on the right.  She reportedly had a nonreactive pupil on the right and drooping of her right eyelid.  She was advised to present to the emergency department to rule out any type of carotid artery dissection.  She denies any other complaints at this time other than anxiousness about potentially having something wrong.  She did state in triage that she had intermittent chest discomfort for the 'past few months' and screening laboratory work-up was initiated on arrival.    Home Medications Prior to Admission medications   Medication Sig Start Date End Date Taking? Authorizing Provider  ALPRAZolam Prudy Feeler) 0.5 MG tablet Take 1 tablet (0.5 mg total) by mouth 3 (three) times daily as needed for anxiety. 07/27/21   Margaree Mackintosh, MD  Ergocalciferol 2000 UNITS TABS Take 2,000 Units by mouth.    [provider]  FLUoxetine (PROZAC) 20 MG capsule TAKE 1 CAPSULE BY MOUTH EVERY DAY 01/30/21   Margaree Mackintosh, MD  rosuvastatin (CRESTOR) 10 MG tablet Take 1 tablet (10 mg total) by mouth daily. 07/18/21   Margaree Mackintosh, MD  tamoxifen (NOLVADEX) 20 MG tablet Take 1 tablet by mouth daily.    [provider]      Allergies    Patient has no known allergies.    Review of Systems   Review of Systems  All other systems reviewed and are negative.  Physical Exam Updated Vital Signs BP  127/90   Pulse 98   Temp 98.8 F (37.1 C)   Resp 20   Ht 5' 7.5" (1.715 m)   Wt 89.4 kg   SpO2 100%   BMI 30.40 kg/m  Physical Exam Vitals and nursing note reviewed.  Constitutional:      General: She is not in acute distress.    Appearance: She is well-developed.  HENT:     Head: Normocephalic and atraumatic.  Eyes:     Conjunctiva/sclera: Conjunctivae normal.  Cardiovascular:     Rate and Rhythm: Normal rate and regular rhythm.     Heart sounds: No murmur heard. Pulmonary:     Effort: Pulmonary effort is normal. No respiratory distress.     Breath sounds: Normal breath sounds.  Abdominal:     Palpations: Abdomen is soft.     Tenderness: There is no abdominal tenderness.  Musculoskeletal:        General: No swelling.     Cervical back: Neck supple.  Skin:    General: Skin is warm and dry.     Capillary Refill: Capillary refill takes less than 2 seconds.     Findings: No rash.     Comments: No facial lesions or rash to suggest zoster  Neurological:     Mental Status: She is alert.     Comments: MENTAL STATUS EXAM:    Orientation: Alert and oriented to person, place and time.  Memory:  Cooperative, follows commands well.  Language: Speech is clear and language is normal.   CRANIAL NERVES:    CN 2 (Optic): Visual fields intact to confrontation.  CN 3,4,6 (EOM): Pupils bilaterally reactive to light, with slight anisocoria present. Full extraocular eye movement without nystagmus.  CN 5 (Trigeminal): Facial sensation is normal, no weakness of masticatory muscles.  CN 7 (Facial): No facial weakness or asymmetry.  CN 8 (Auditory): Auditory acuity grossly normal.  CN 9,10 (Glossophar): The uvula is midline, the palate elevates symmetrically.  CN 11 (spinal access): Normal sternocleidomastoid and trapezius strength.  CN 12 (Hypoglossal): The tongue is midline. No atrophy or fasciculations.Marland Kitchen   MOTOR:  Muscle Strength: 5/5RUE, 5/5LUE, 5/5RLE, 5/5LLE.   COORDINATION:    Intact finger-to-nose, no tremor.   SENSATION:   Intact to light touch all four extremities.  GAIT: Gait normal without ataxia   Psychiatric:        Mood and Affect: Mood normal.    ED Results / Procedures / Treatments   Labs (all labs ordered are listed, but only abnormal results are displayed) Labs Reviewed  BASIC METABOLIC PANEL - Abnormal; Notable for the following components:      Result Value   Glucose, Bld 115 (*)    All other components within normal limits  CBC  HCG, SERUM, QUALITATIVE  TROPONIN I (HIGH SENSITIVITY)  TROPONIN I (HIGH SENSITIVITY)    EKG EKG Interpretation  Date/Time:  Wednesday July 27 2021 11:33:10 EDT Ventricular Rate:  102 PR Interval:  145 QRS Duration: 100 QT Interval:  368 QTC Calculation: 480 R Axis:   73 Text Interpretation: Sinus tachycardia Abnormal R-wave progression, early transition Probable inferior infarct, old Consider anterolateral infarct Confirmed by Ernie Avena ([id number]) on 07/27/2021 11:42:21 AM  Radiology CT ANGIO HEAD NECK W WO CM  Result Date: 07/27/2021 CLINICAL DATA:  Nonreactive right pupil, concern for Horner's syndrome EXAM: CT ANGIOGRAPHY HEAD AND NECK TECHNIQUE: Multidetector CT imaging of the head and neck was performed using the standard protocol during bolus administration of intravenous contrast. Multiplanar CT image reconstructions and MIPs were obtained to evaluate the vascular anatomy. Carotid stenosis measurements (when applicable) are obtained utilizing NASCET criteria, using the distal internal carotid diameter as the denominator. RADIATION DOSE REDUCTION: This exam was performed according to the departmental dose-optimization program which includes automated exposure control, adjustment of the mA and/or kV according to patient size and/or use of iterative reconstruction technique. CONTRAST:  75mL OMNIPAQUE IOHEXOL 350 MG/ML SOLN COMPARISON:  None. FINDINGS: CT HEAD FINDINGS Brain: There is no evidence of acute  intracranial hemorrhage, extra-axial fluid collection, or acute infarct Parenchymal volume is normal. The ventricles are normal in size. Gray-white differentiation is preserved. There is no mass lesion.  There is no mass effect or midline shift. Vascular: See below. Skull: Normal. Negative for fracture or focal lesion. Sinuses and orbits: The paranasal sinuses are clear. The globes and orbits are unremarkable. Other: None. Review of the MIP images confirms the above findings CTA NECK FINDINGS Aortic arch: The aortic arch is normal. The left vertebral artery arises directly from the aortic arch, a normal variant. The origins of the major branch vessels are patent. The subclavian arteries are patent to the level imaged. Right carotid system: The right common, internal, and external carotid arteries are patent, without hemodynamically significant stenosis or occlusion. There is no dissection or aneurysm. Left carotid system: The left common, internal, and external carotid arteries are patent, without hemodynamically significant stenosis or occlusion. There is no  dissection or aneurysm. Vertebral arteries: The vertebral arteries are patent, without hemodynamically significant stenosis or occlusion. There is no dissection or aneurysm. Skeleton: There is no acute osseous abnormality or aggressive osseous lesion. There is no visible canal hematoma. Other neck: Soft tissues are unremarkable. Upper chest: The imaged lung apices are clear. Review of the MIP images confirms the above findings CTA HEAD FINDINGS Anterior circulation: Intracranial ICAs are patent. The bilateral MCAs are patent. The bilateral ACAs are patent. The anterior communicating artery is normal. There is no aneurysm or AVM. Posterior circulation: The bilateral V4 segments are patent. PICA origin is identified on the left and not definitely seen on the right. The basilar artery is patent. The bilateral PCAs are patent. There is a fetal origin of the left  PCA. A small right posterior communicating artery is also seen. There is no aneurysm or AVM. Venous sinuses: Patent. Anatomic variants: As above. Review of the MIP images confirms the above findings IMPRESSION: Normal CT/CTA of the head and neck. Electronically Signed   By: Lesia Hausen M.D.   On: 07/27/2021 13:41   DG Chest 2 View  Result Date: 07/27/2021 CLINICAL DATA:  Horner's syndrome EXAM: CHEST - 2 VIEW COMPARISON:  None. FINDINGS: The cardiomediastinal silhouette is normal. There is no focal consolidation or pulmonary edema. There is no pleural effusion or pneumothorax There is no acute osseous abnormality. IMPRESSION: No radiographic evidence of acute cardiopulmonary process. Electronically Signed   By: Lesia Hausen M.D.   On: 07/27/2021 13:25    Procedures Procedures    Medications Ordered in ED Medications  iohexol (OMNIPAQUE) 350 MG/ML injection 100 mL (75 mLs Intravenous Contrast Given 07/27/21 1304)    ED Course/ Medical Decision Making/ A&P                           Medical Decision Making Amount and/or Complexity of Data Reviewed Labs: ordered. Radiology: ordered.  Risk Prescription drug management.   46 year old female presenting to the emergency department with concern for potential Horner syndrome.  The patient states that she has had drooping of her right eyelid "for years."  She had taken her father to surgery with ophthalmology and an ophthalmologist recommended that she get evaluated in the emergency department because there was some concern for Horner syndrome on the right.  She reportedly had a nonreactive pupil on the right and drooping of her right eyelid.  She was advised to present to the emergency department to rule out any type of carotid artery dissection.  She denies any other complaints at this time other than anxiousness about potentially having something wrong.  She did state in triage that she had intermittent chest discomfort for the 'past few months'  and screening laboratory work-up was initiated on arrival.  On arrival, the patient was mildly tachycardic, P104, hypertensive BP 164/107, with an exam concerning for likely anisocoria of the pupils. Very slight ptosis of the right eyelid present which that patient states has been an issue 'for years.' No active neck pain. Other than slight anisocoria and ptosis she is neurologically intact.  Symptoms per the patient do not appear to be acute. No signs on exam for lateral medullary infarction. Will obtain CTA imaging to rule out potential vascular etiology such as an aneurysm or chronic dissection or an intracranial mass. Will obtain CXR to evaluate for pancoast tumor. CTA of the neck will also evaluate the lung apices to check for this. Given chronicity,  low concern for acute CVA warranting emergent MRI imaging at this time.   CXR reviewed by myself and radiology negative for acute findings.   Screening laboratory workup performed and unremarkable.   CTA imaging performed and negative for aneurysm, dissection, or mass. Discussed MRI imaging but given chronicity, feel that further workup can be obtained outpatient. Advised she follow-up with her PCP.     Final Clinical Impression(s) / ED Diagnoses Final diagnoses:  Ptosis of right eyelid  Anisocoria    Rx / DC Orders ED Discharge Orders     None                  Ernie Avena, MD 07/28/21 1322

## 2021-07-27 NOTE — Patient Instructions (Addendum)
It was a pleasure to see you today.  Please work on diet exercise and weight loss.  Return in 1 year or as needed.  Xanax order pended.  Recommend CT cardiac scoring 10 mg recommend Crestor 10 mg daily.  Follow-up previously was on Crestor 5 mg daily recommend changing to 10 mg daily.  Recommend coronary calcium scoring.  Recommend coronary calcium scoring.  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  Resection in February 21 at that time total cholesterol was 230, triglycerides 181 and LDL cholesterol 143.  Talked about diet exercise and weight loss issues.  30.40 ?

## 2021-07-27 NOTE — Discharge Instructions (Signed)
Your imaging was negative for acute abnormalities. Your laboratory workup was reassuring. Recommend follow-up with your PCP ?

## 2021-07-27 NOTE — ED Triage Notes (Signed)
Patient here POV from Home.  ? ?Patient was at an Outpatient Office today (Family Member was the Patient).  ? ?Surgeon noted Right Pupil was Non-Reactive and that Patient may have Horner's Syndrome and referred Patient to have CT to rule out Carotid Artery Dissection.  ? ?No Physical Complaints but Patient does endorse Left-Sided CP for a few months that is Intermittent and [place] in [place]. No SOB. No Fevers. No N/V/D.  ? ?NAD Noted during Triage. A&Ox4. Gcs 15. Ambulatory. ?

## 2021-07-28 ENCOUNTER — Telehealth: Payer: Self-pay | Admitting: Internal Medicine

## 2021-07-28 ENCOUNTER — Encounter: Payer: Self-pay | Admitting: Internal Medicine

## 2021-07-28 NOTE — Telephone Encounter (Signed)
Phone call to patient after receiving MyChart message. It seems she was at [facility] with her father and eye physician noted a ptosis of her eye and suggested an evaluation for a carotid dissection it seems. She went to ED and this was rules out. She does have an eye appt with same physician that saw her father, Dr. Roxanna Mew in the near future. ? ? She is asking if she should still have coronary calcium scoring. I think she should go ahead with that as well due to history of pure hypercholesterolemia and family hx of heart disease. ?

## 2021-07-31 ENCOUNTER — Other Ambulatory Visit: Payer: Self-pay | Admitting: Internal Medicine

## 2021-08-05 DIAGNOSIS — H5702 Anisocoria: Secondary | ICD-10-CM | POA: Diagnosis not present

## 2021-08-08 ENCOUNTER — Ambulatory Visit (HOSPITAL_BASED_OUTPATIENT_CLINIC_OR_DEPARTMENT_OTHER)
Admission: RE | Admit: 2021-08-08 | Discharge: 2021-08-08 | Disposition: A | Discharge status: Home / Self Care | Payer: BC Managed Care – PPO | Source: Ambulatory Visit | Attending: Internal Medicine | Admitting: Internal Medicine

## 2021-08-08 DIAGNOSIS — Z8249 Family history of ischemic heart disease and other diseases of the circulatory system: Secondary | ICD-10-CM | POA: Insufficient documentation

## 2021-08-08 DIAGNOSIS — H02401 Unspecified ptosis of right eyelid: Secondary | ICD-10-CM | POA: Diagnosis not present

## 2021-08-08 IMAGING — CT CT CARDIAC CORONARY ARTERY CALCIUM SCORE
3 series · 14 of 20 positions shown, 16 images · non-contrast
Comparison: None.
COMPARISON: None.

Addendum:
EXAM:
OVER-READ INTERPRETATION  CT CHEST

The following report is an over-read performed by radiologist Dr.
TORGUNN [REDACTED] on [DATE]. This
over-read does not include interpretation of cardiac or coronary
anatomy or pathology. The coronary calcium score interpretation by
the cardiologist is attached.
TECHNIQUE: A gated, non-contrast computed tomography scan of the heart was
performed using 3mm slice thickness. Axial images were analyzed on a
dedicated workstation. Calcium scoring of the coronary arteries was
performed using the Agatston method.

[Series 2: ax lung · axial · 0.85mm/px · z∈[+1282,+1394]mm · 5 of 86 slices shown]
[im 15/86  lung]
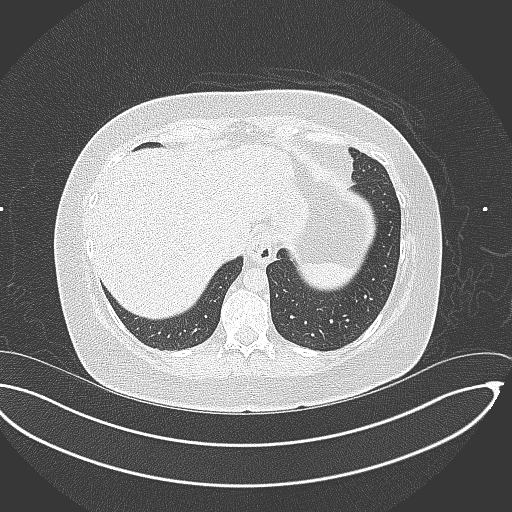
[im 29/86  lung]
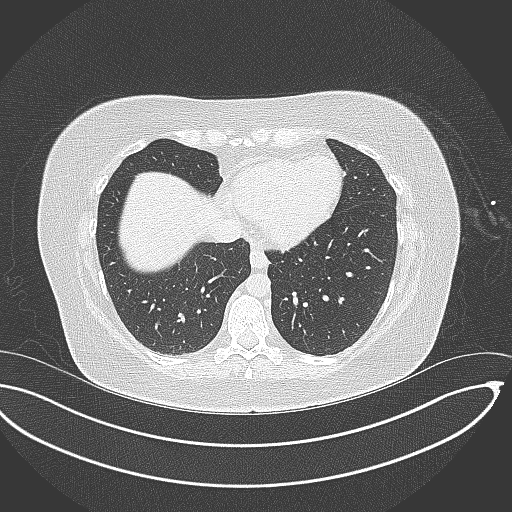
[im 43/86  lung]
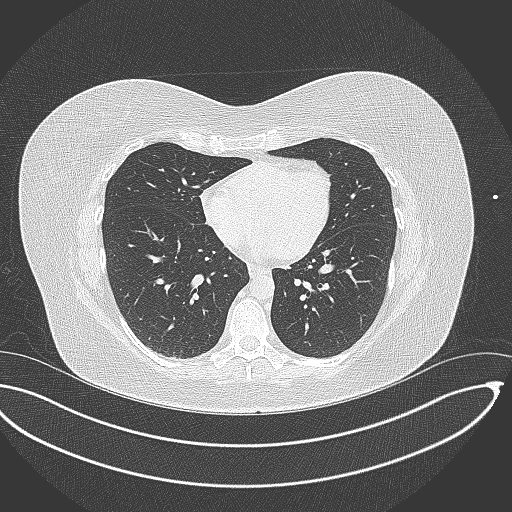
[im 57/86  lung]
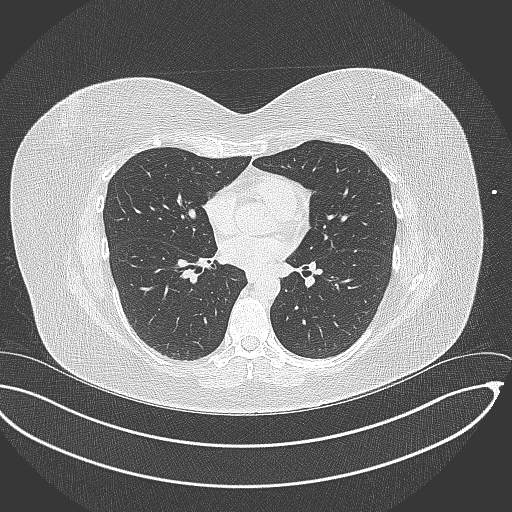
[im 71/86  lung]
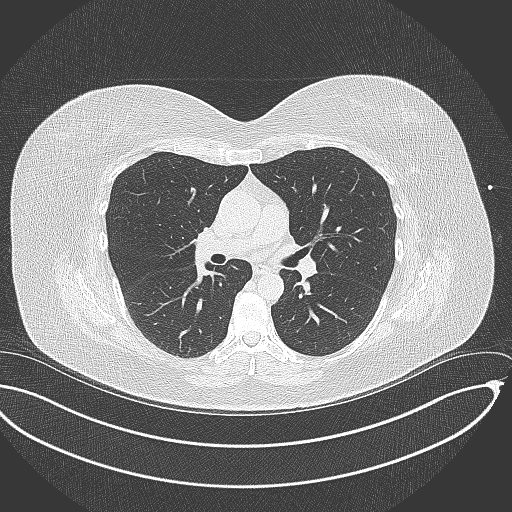

[Series 3: cascseq 3.0 sa36 70% (id) · axial · 0.39mm/px · z∈[+1298,+1382]mm · 3 of 57 slices shown]
[im 15/57  vessel]
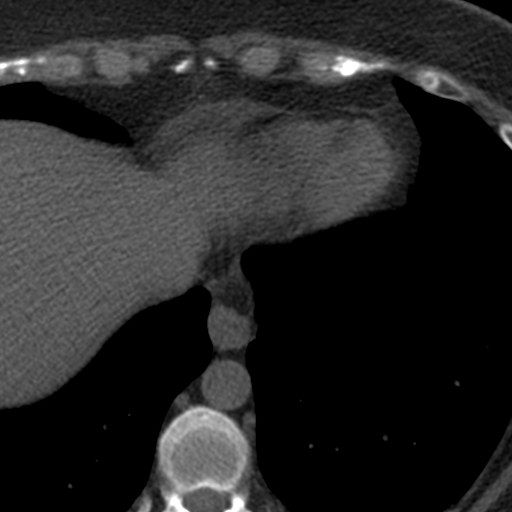
[im 29/57  vessel]
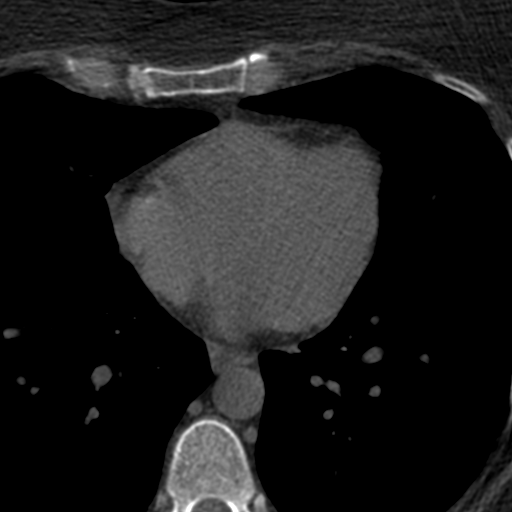
[im 43/57  vessel]
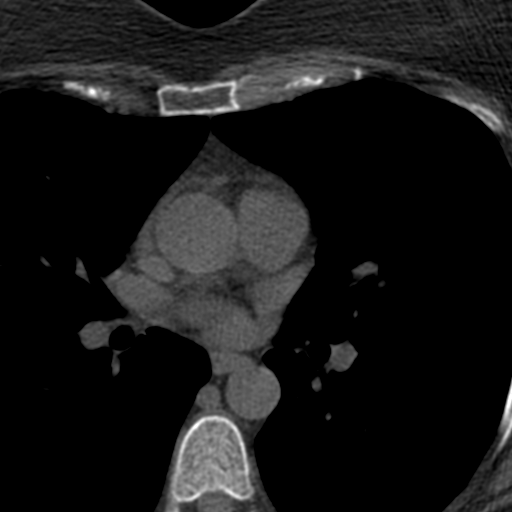

[Series 4: ax st · axial · 0.72mm/px · z∈[+1278,+1398]mm · 6 of 86 slices shown, 8 images]
[im 13/86  vessel]
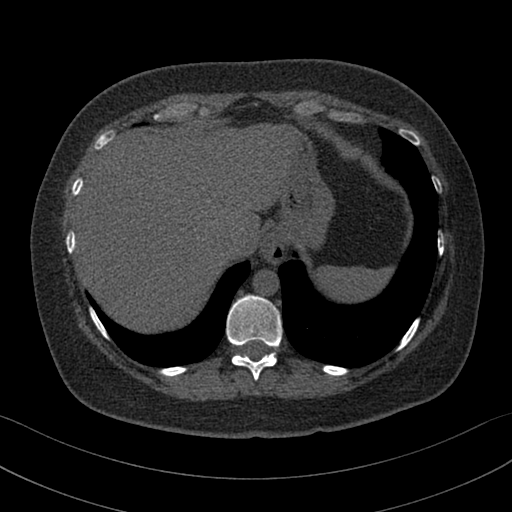
[im 13/86  lung]
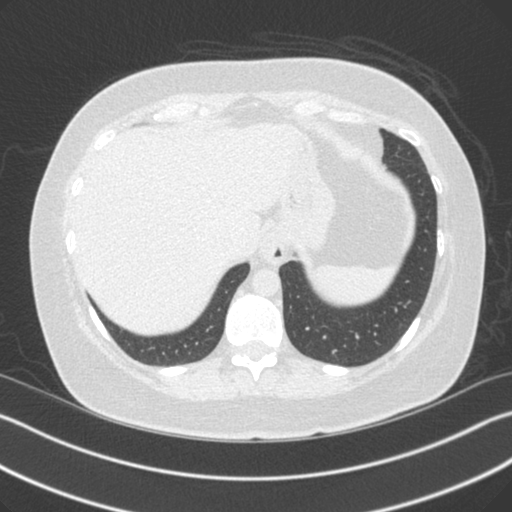
[im 25/86  vessel]
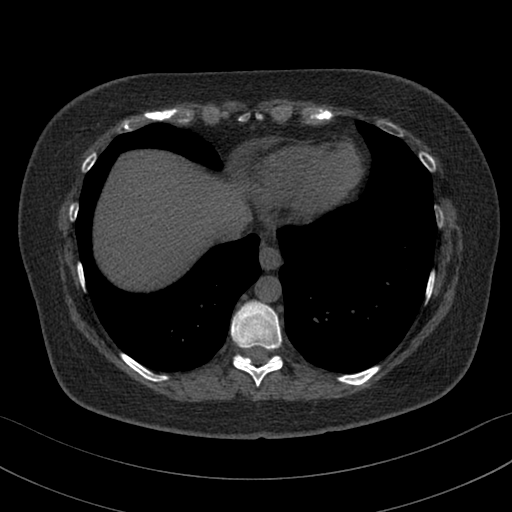
[im 37/86  vessel]
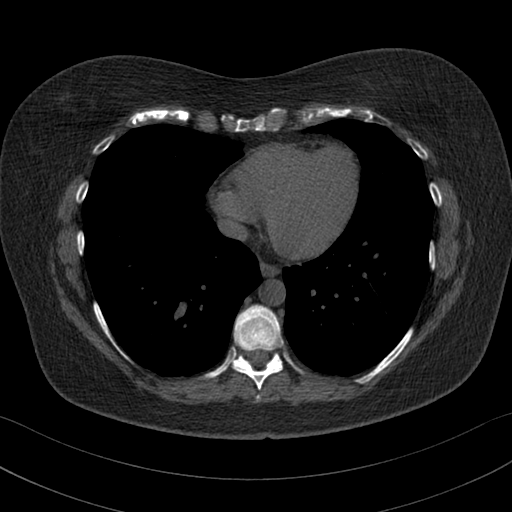
[im 49/86  vessel]
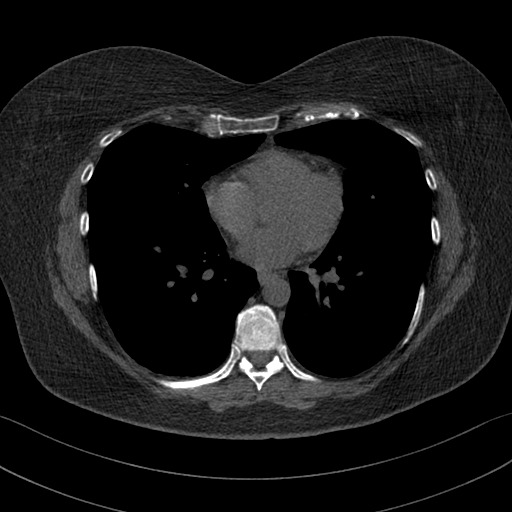
[im 61/86  vessel]
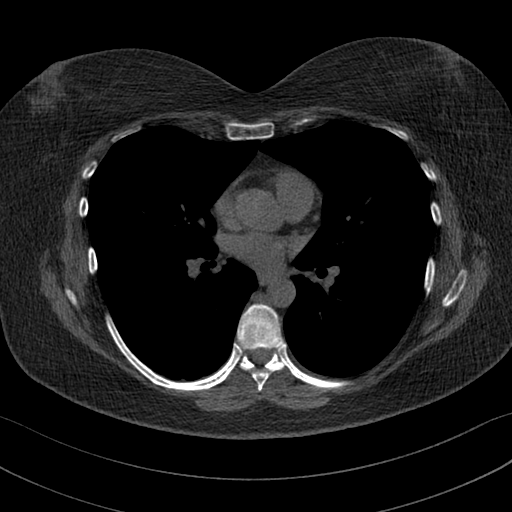
[im 61/86  lung]
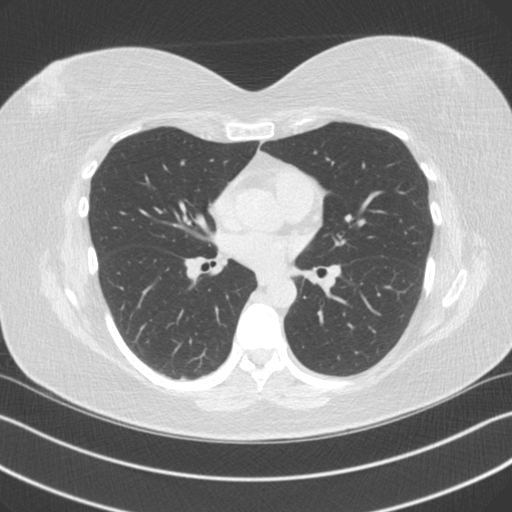
[im 73/86  vessel]
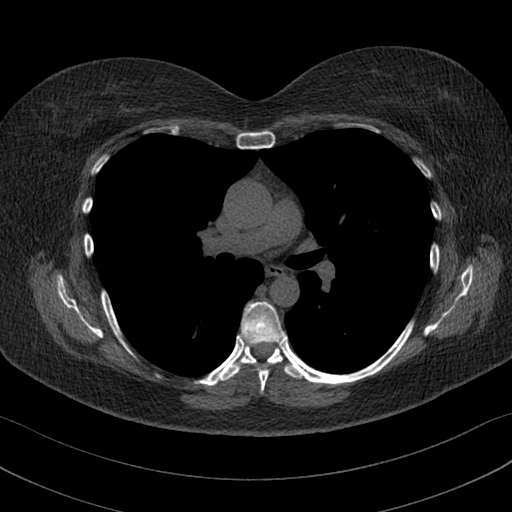

[14 of 20 positions shown; findings below may reference images not displayed]

FINDINGS: 4 mm subpleural nodule in the posterior aspect of the left lower
lobe (axial image 78 of series 2), nonspecific, but statistically
likely benign. Within the visualized portions of the thorax there
are no other larger more suspicious appearing pulmonary nodules or
masses, there is no acute consolidative airspace disease, no pleural
effusions, no pneumothorax and no lymphadenopathy. Small hiatal
hernia. Visualized portions of the upper abdomen are unremarkable.
There are no aggressive appearing lytic or blastic lesions noted in
the visualized portions of the skeleton.
IMPRESSION: 1. 4 mm left lower lobe pulmonary nodule, nonspecific, but
statistically likely a benign subpleural lymph node. No follow-up
needed if patient is low-risk.This recommendation follows the
consensus statement: Guidelines for Management of Incidental
Pulmonary Nodules Detected on CT Images: From the [REDACTED]AL DATA:  46F for cardiovascular disease risk stratification

EXAM:
Coronary Calcium Score
FINDINGS: Coronary arteries: Normal origins.

Coronary Calcium Score:

Total: 0

Percentile: 0

Pericardium: Normal.

Ascending Aorta: Normal caliber.  3.5 cm.  Aortic atherosclerosis.

Non-cardiac: See separate report from [REDACTED].
IMPRESSION: Coronary calcium score of 0. This was 0 percentile for age-, race-,
and sex-matched controls.

Aortic atherosclerosis.



If CAC=0, it is reasonable to withhold statin therapy and reassess
in 5 to 10 years, as long as higher risk conditions are absent
(diabetes mellitus, family history of premature CHD in first degree
relatives (males <55 years; females <65 years), cigarette smoking,
or LDL >=190 mg/dL).

If CAC is 1 to 99, it is reasonable to initiate statin therapy for
patients >=55 years of age.

If CAC is >=100 or >=75th percentile, it is reasonable to initiate
statin therapy at any age.

Cardiology referral should be considered for patients with CAC
scores >=400 or >=75th percentile.

*[SJ] AHA/ACC/AACVPR/AAPA/ABC/TORGUNN/TORGUNN/TORGUNN/TORGUNN/TORGUNN/TORGUNN/TORGUNN
Guideline on the Management of Blood Cholesterol: A Report of the
American College of Cardiology/American Heart Association Task Force
on Clinical Practice Guidelines. J Am Coll Cardiol.
[SJ];73(24):[PHONE_NUMBER].

*** End of Addendum ***
EXAM:
OVER-READ INTERPRETATION  CT CHEST

The following report is an over-read performed by radiologist Dr.
TORGUNN [REDACTED] on [DATE]. This
over-read does not include interpretation of cardiac or coronary
anatomy or pathology. The coronary calcium score interpretation by
the cardiologist is attached.
FINDINGS: 4 mm subpleural nodule in the posterior aspect of the left lower
lobe (axial image 78 of series 2), nonspecific, but statistically
likely benign. Within the visualized portions of the thorax there
are no other larger more suspicious appearing pulmonary nodules or
masses, there is no acute consolidative airspace disease, no pleural
effusions, no pneumothorax and no lymphadenopathy. Small hiatal
hernia. Visualized portions of the upper abdomen are unremarkable.
There are no aggressive appearing lytic or blastic lesions noted in
the visualized portions of the skeleton.
IMPRESSION: 1. 4 mm left lower lobe pulmonary nodule, nonspecific, but
statistically likely a benign subpleural lymph node. No follow-up
needed if patient is low-risk.This recommendation follows the
consensus statement: Guidelines for Management of Incidental
Pulmonary Nodules Detected on CT Images: From the [HOSPITAL]

## 2021-08-08 NOTE — Progress Notes (Signed)
Scheduled appointment

## 2021-08-11 ENCOUNTER — Ambulatory Visit: Payer: BC Managed Care – PPO | Admitting: Internal Medicine

## 2021-08-11 ENCOUNTER — Other Ambulatory Visit: Payer: Self-pay | Admitting: Optometry

## 2021-08-11 VITALS — BP 124/82 | HR 121 | Temp 100.6°F | Ht 67.5 in | Wt 192.8 lb

## 2021-08-11 DIAGNOSIS — E782 Mixed hyperlipidemia: Secondary | ICD-10-CM

## 2021-08-11 DIAGNOSIS — H5702 Anisocoria: Secondary | ICD-10-CM

## 2021-08-11 DIAGNOSIS — Z8249 Family history of ischemic heart disease and other diseases of the circulatory system: Secondary | ICD-10-CM | POA: Diagnosis not present

## 2021-08-11 DIAGNOSIS — H02401 Unspecified ptosis of right eyelid: Secondary | ICD-10-CM

## 2021-08-11 DIAGNOSIS — F411 Generalized anxiety disorder: Secondary | ICD-10-CM

## 2021-08-11 NOTE — Progress Notes (Signed)
? ?  Subjective:  ? ? Patient ID: Lisa Golden, female    DOB: Nov 25, 1975, 46 y.o.   MRN: [id number] ? ?HPI 46 year old  Female seen to discuss CT cardiac scoring study done recently.  She has a family history of heart disease in her father.  Has been on statin medication, Crestor 10 mg daily.  Lipid panel in March was stable.  Total cholesterol was 190 and LDL was 104 but markedly better than in 2020 when LDL was 153 and her total cholesterol was 236. ? ?She has a history of anxiety.  She went with her father to see ophthalmologist at [facility] on March 22.  Ophthalmologist looked at her and felt she had a droopy right eye and directed her to go immediately to the emergency department without much explanation.  This was frightening for her.  Reportedly had a nonreactive pupil on the right.  There was some concern for carotid artery dissection.  She was seen by the emergency department physician on March 22.  She underwent CT angio of head and neck as well as a chest x-ray all of which were normal. ? ?Recent CT calcium scoring report reviewed with her.  She had a normal ascending aorta caliber of 3.5 cm.  Aortic atherosclerosis present.  Explained to her that atherosclerosis starts at a young age and this is not an abnormal finding.  A coronary calcium score was 0.  That is an excellent score.  With her family history however I would suggest that she continue with her statin medication.  I would encourage diet exercise and weight loss.  I would like to see her LDL under 100 but it was 104 in March of this year.  We are going to continue with generic Crestor 10 mg daily.  She does have a history of impaired glucose tolerance with hemoglobin 5.8%.  She is currently not on metformin.  We could consider that at next visit.  She takes Xanax and Prozac for anxiety. ? ? ?Her husband is present with her today.  He is a former paramedic so he has understanding of medical terminology.  He admits that she  gets anxious. ? ?Apparently she does have an upcoming eye exam. ? ? ?Review of Systems no new complaints ? ?   ?Objective:  ? Physical Exam ? ?Blood pressure 124/82 left arm pulse 121-patient is anxious pulse oximetry 97% Temp taken by CMA as 38.1 degrees but patient not ill today in office. ?Neck is supple.  Chest clear.  I do not see a ptosis of the eye.  No facial weakness.  Affect is normal.  Cardiac exam: Regular rate and rhythm. ? ?   ?Assessment & Plan:  ? ?Hx of mixed hyperlipidemia-continue generic Crestor 10 mg daily ? ?Family hx of heart disease-her coronary calcium score is 0.  She does have family history.  Agree with continuing with current statin medication dose. ? ?BMI 29.75-recommend diet exercise and weight loss ? ??  Ptosis-recent imaging at [place] was negative.  She is going to have eye exam at [facility] in the near future. ? ?Anxiety state continue Prozac and Xanax as previously prescribed. ? ?

## 2021-08-19 DIAGNOSIS — D122 Benign neoplasm of ascending colon: Secondary | ICD-10-CM | POA: Diagnosis not present

## 2021-08-19 DIAGNOSIS — K635 Polyp of colon: Secondary | ICD-10-CM | POA: Diagnosis not present

## 2021-08-19 DIAGNOSIS — K621 Rectal polyp: Secondary | ICD-10-CM | POA: Diagnosis not present

## 2021-08-19 DIAGNOSIS — Z1211 Encounter for screening for malignant neoplasm of colon: Secondary | ICD-10-CM | POA: Diagnosis not present

## 2021-08-23 ENCOUNTER — Encounter: Payer: Self-pay | Admitting: Internal Medicine

## 2021-08-25 ENCOUNTER — Telehealth: Payer: Self-pay | Admitting: Internal Medicine

## 2021-08-25 ENCOUNTER — Encounter: Payer: Self-pay | Admitting: Internal Medicine

## 2021-08-25 DIAGNOSIS — H02401 Unspecified ptosis of right eyelid: Secondary | ICD-10-CM

## 2021-08-25 NOTE — Telephone Encounter (Signed)
Phone call to patient this morning. I have reviewed results faxed to [place] from Ophthalmologist at [facility]. Extensive labs drawn looking for etiology of ptosis. All Labs are WNL that I reviewed except for mild elevation of MPO. This is sometimes seen in vasculitis. However, patient carries no such diagnosis and has no symptoms at the present time.Ophthalmologist has ordered MRI of brain for later this month. ? ?MJB, MD ?

## 2021-08-28 NOTE — Patient Instructions (Signed)
Discussion with patient and her husband regarding recent issue raised at [facility].  She is going to have an eye exam there in the near future.  She will continue with Prozac and Xanax for anxiety.  She will continue with Crestor for history of mixed hyperlipidemia. ?

## 2021-09-02 ENCOUNTER — Ambulatory Visit
Admission: RE | Admit: 2021-09-02 | Discharge: 2021-09-02 | Disposition: A | Discharge status: Home / Self Care | Payer: BC Managed Care – PPO | Source: Ambulatory Visit | Attending: Optometry | Admitting: Optometry

## 2021-09-02 DIAGNOSIS — H02401 Unspecified ptosis of right eyelid: Secondary | ICD-10-CM

## 2021-09-02 DIAGNOSIS — H5702 Anisocoria: Secondary | ICD-10-CM

## 2021-09-02 IMAGING — MR MR HEAD WO/W CM
12 series · 48 of 48 positions shown · IV contrast (18ml Multihance)
Comparison: None.

CLINICAL DATA: Right ptosis

EXAM:
MRI HEAD WITHOUT AND WITH CONTRAST
TECHNIQUE: Multiplanar, multiecho pulse sequences of the brain and surrounding
structures were obtained without and with intravenous contrast.
CONTRAST:  18mL MULTIHANCE GADOBENATE DIMEGLUMINE 529 MG/ML IV SOLN

[Series 2: T1 · sagittal · 5.0mm · 0.49mm/px · 1 of 25 slices shown]
[im 1/25]
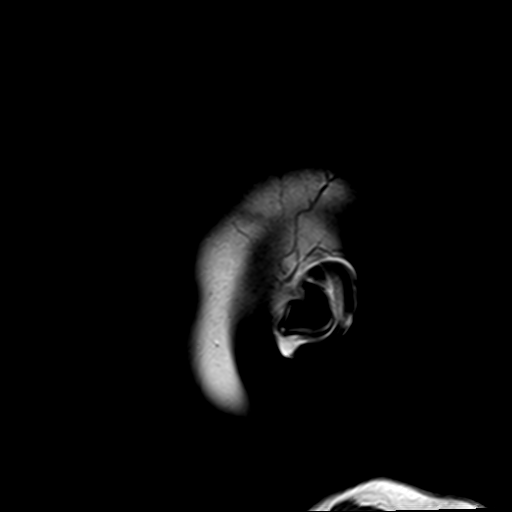

[Series 3: DWI · axial · 3.0mm · 1.80mm/px · z∈[-54,+95]mm · 7 of 102 slices shown (1 of 4)]
[im 1/102]
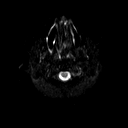
[im 17/102]
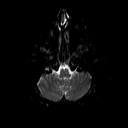
[im 34/102]
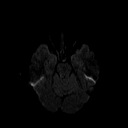
[im 51/102]
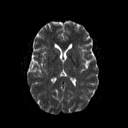
[im 68/102]
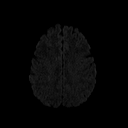
[im 85/102]
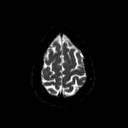
[im 102/102]
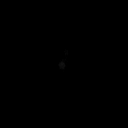

[Series 4: DWI · axial · 3.0mm · 1.80mm/px · z∈[-54,+95]mm · 3 of 50 slices shown (2 of 4)]
[im 1/50]
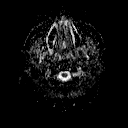
[im 25/50]
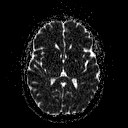
[im 50/50]
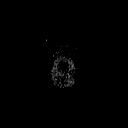

[Series 5: DWI · coronal · 5.0mm · 1.80mm/px · 5 of 72 slices shown (3 of 4)]
[im 1/72]
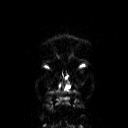
[im 18/72]
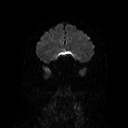
[im 36/72]
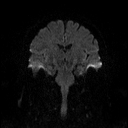
[im 54/72]
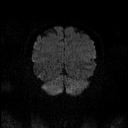
[im 72/72]
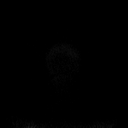

[Series 6: DWI · coronal · 5.0mm · 1.80mm/px · 2 of 37 slices shown (4 of 4)]
[im 1/37]
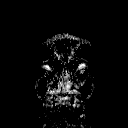
[im 37/37]
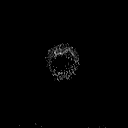

[Series 7: T2 · axial · 5.0mm · 0.72mm/px · z∈[-61,+93]mm · 2 of 24 slices shown (1 of 2)]
[im 1/24]
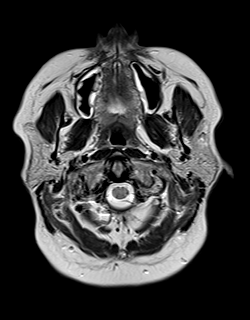
[im 24/24]
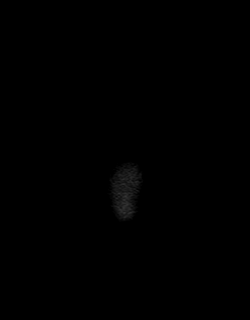

[Series 8: FLAIR · axial · 3.0mm · 0.45mm/px · z∈[-59,+89]mm · 2 of 33 slices shown]
[im 1/33]
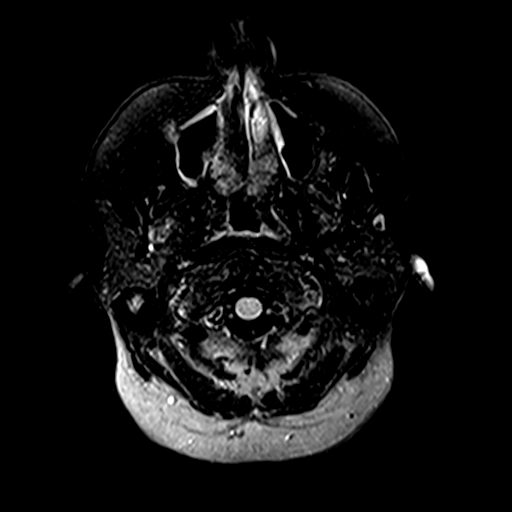
[im 33/33]
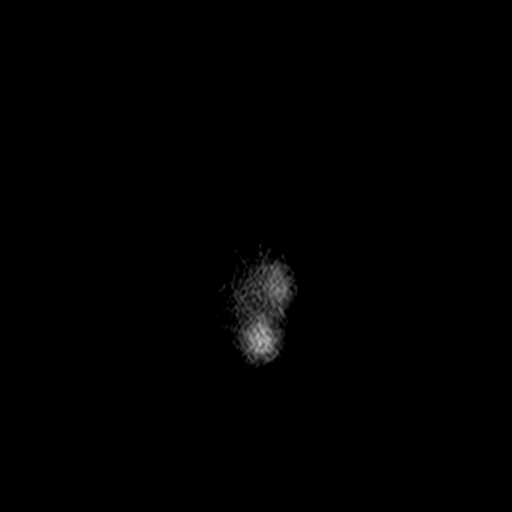

[Series 10: swi_images · axial · 4.0mm · 0.90mm/px · z∈[-54,+85]mm · 2 of 36 slices shown]
[im 1/36]
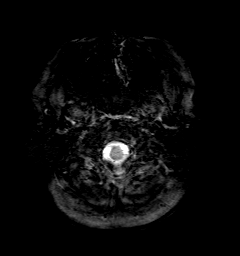
[im 36/36]
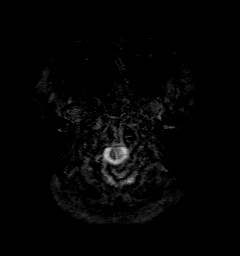

[Series 11: t1_mpr_tra · axial · 1.0mm · 0.75mm/px · z∈[-54,+88]mm · 10 of 144 slices shown]
[im 1/144]
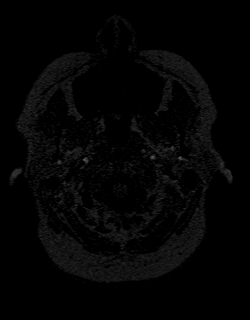
[im 16/144]
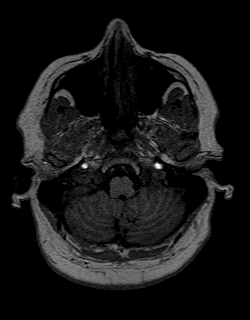
[im 32/144]
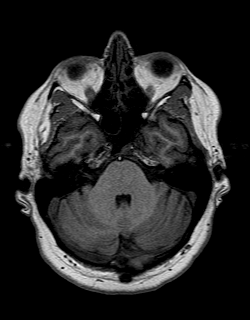
[im 48/144]
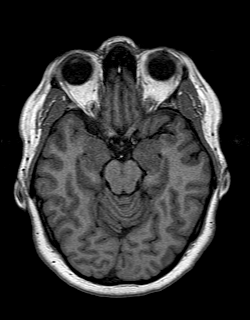
[im 64/144]
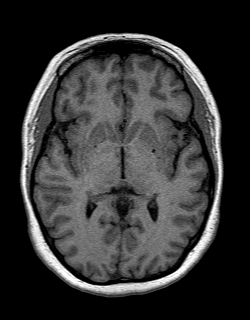
[im 80/144]
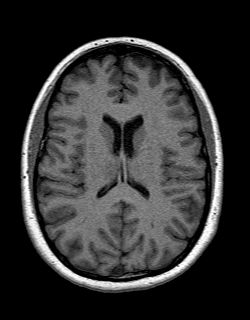
[im 96/144]
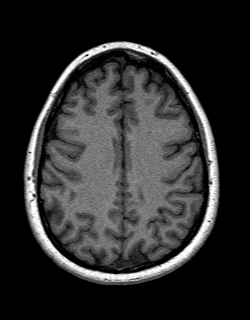
[im 112/144]
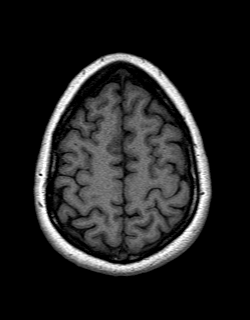
[im 128/144]
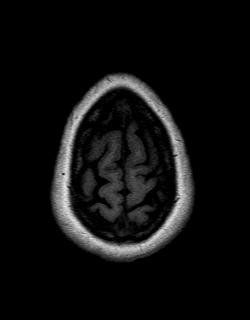
[im 144/144]
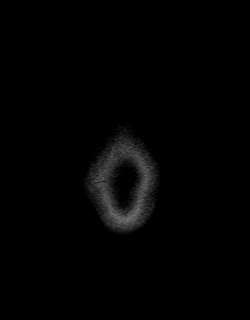

[Series 12: T2 · coronal · 5.0mm · 0.45mm/px · 2 of 28 slices shown (2 of 2)]
[im 1/28]
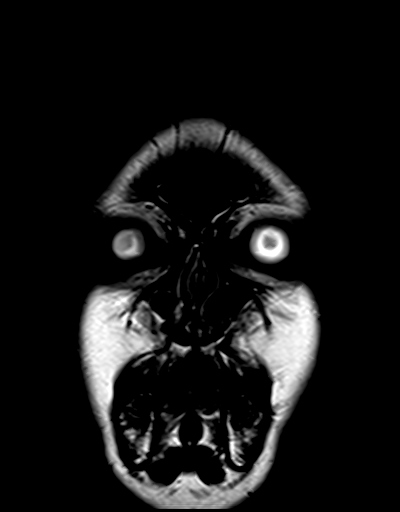
[im 28/28]
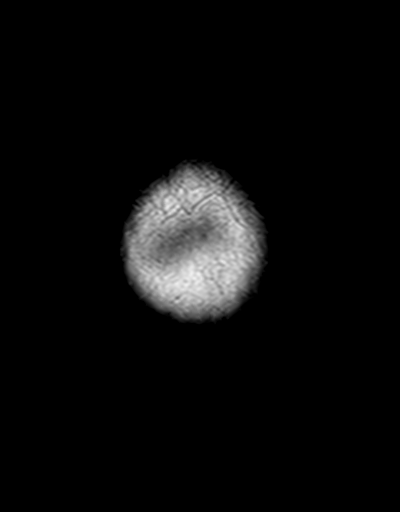

[Series 13: t1_mpr_tra post · axial · 1.0mm · 0.75mm/px · z∈[-54,+88]mm · 10 of 144 slices shown]
[im 1/144]
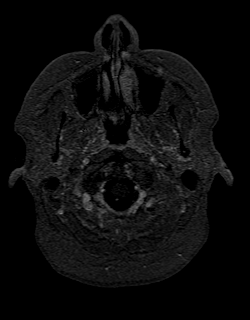
[im 16/144]
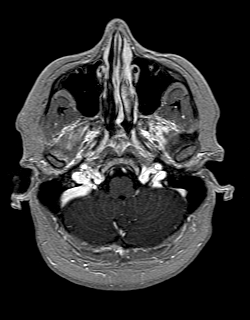
[im 32/144]
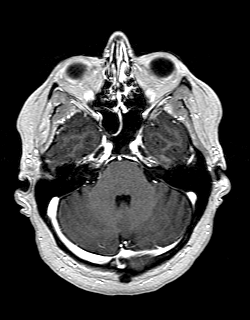
[im 48/144]
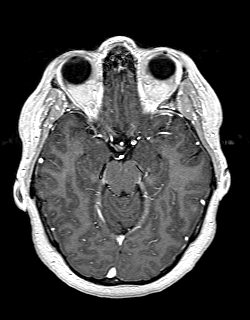
[im 64/144]
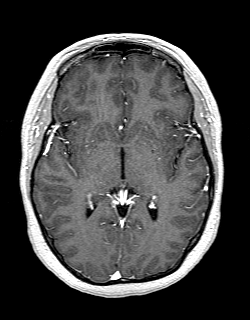
[im 80/144]
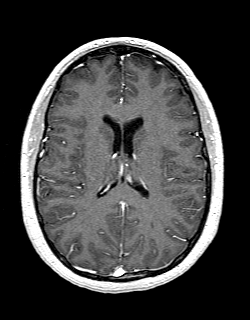
[im 96/144]
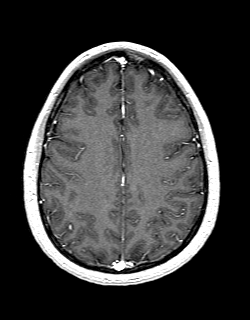
[im 112/144]
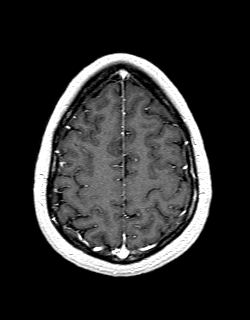
[im 128/144]
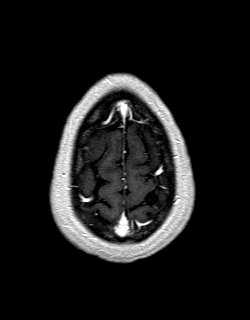
[im 144/144]
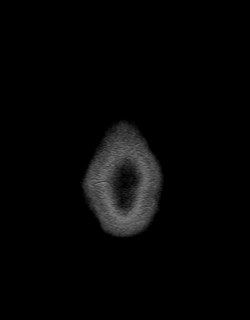

[Series 14: post cor · coronal · 5.0mm · 0.45mm/px · 2 of 28 slices shown]
[im 1/28]
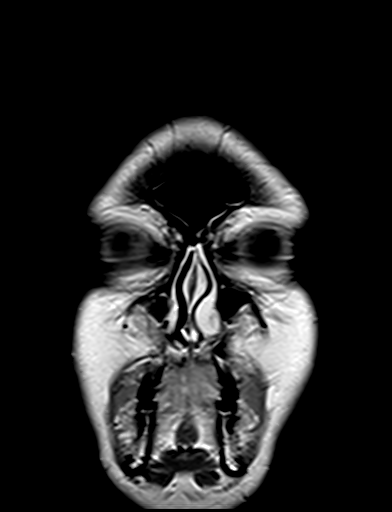
[im 28/28]
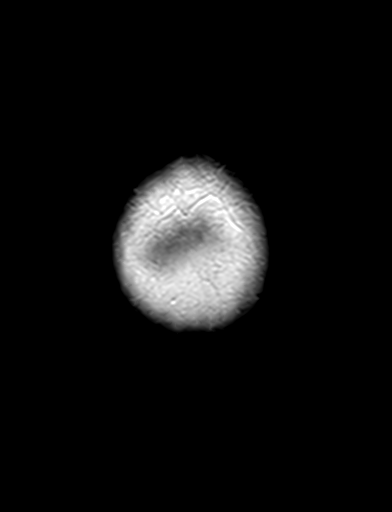

[48 of 48 positions shown; findings below may reference images not displayed]

FINDINGS: Brain: There is no acute infarction or intracranial hemorrhage.
There is no intracranial mass, mass effect, or edema. There is no
hydrocephalus or extra-axial fluid collection. Ventricles and sulci
are normal in size and configuration. No abnormal enhancement.

Vascular: Major vessel flow voids at the skull base are preserved.

Skull and upper cervical spine: Normal marrow signal is preserved.

Sinuses/Orbits: Mild mucosal thickening.  Orbits are unremarkable.

Other: Sella is unremarkable.  Mastoid air cells are clear.
IMPRESSION: No evidence of recent infarction, hemorrhage, or mass. No abnormal
enhancement.

## 2021-09-02 MED ORDER — GADOBENATE DIMEGLUMINE 529 MG/ML IV SOLN
18.0000 mL | Freq: Once | INTRAVENOUS | Status: AC | PRN
  Administered 2021-09-02: 18 mL via INTRAVENOUS

## 2021-09-10 ENCOUNTER — Other Ambulatory Visit: Payer: Self-pay | Admitting: Internal Medicine

## 2021-10-11 ENCOUNTER — Other Ambulatory Visit: Payer: Self-pay | Admitting: Surgery

## 2021-10-11 DIAGNOSIS — Z9189 Other specified personal risk factors, not elsewhere classified: Secondary | ICD-10-CM

## 2021-10-25 ENCOUNTER — Inpatient Hospital Stay: Admission: RE | Admit: 2021-10-25 | Payer: BC Managed Care – PPO | Source: Ambulatory Visit

## 2021-11-25 DIAGNOSIS — H524 Presbyopia: Secondary | ICD-10-CM | POA: Diagnosis not present

## 2021-11-25 DIAGNOSIS — H5203 Hypermetropia, bilateral: Secondary | ICD-10-CM | POA: Diagnosis not present

## 2021-11-25 DIAGNOSIS — H3500 Unspecified background retinopathy: Secondary | ICD-10-CM | POA: Diagnosis not present

## 2021-11-28 DIAGNOSIS — Z1239 Encounter for other screening for malignant neoplasm of breast: Secondary | ICD-10-CM | POA: Diagnosis not present

## 2021-12-06 ENCOUNTER — Other Ambulatory Visit: Payer: Self-pay | Admitting: Surgery

## 2021-12-06 DIAGNOSIS — Z1239 Encounter for other screening for malignant neoplasm of breast: Secondary | ICD-10-CM

## 2021-12-14 DIAGNOSIS — L578 Other skin changes due to chronic exposure to nonionizing radiation: Secondary | ICD-10-CM | POA: Diagnosis not present

## 2021-12-14 DIAGNOSIS — L821 Other seborrheic keratosis: Secondary | ICD-10-CM | POA: Diagnosis not present

## 2021-12-14 DIAGNOSIS — L708 Other acne: Secondary | ICD-10-CM | POA: Diagnosis not present

## 2021-12-14 DIAGNOSIS — D225 Melanocytic nevi of trunk: Secondary | ICD-10-CM | POA: Diagnosis not present

## 2021-12-14 DIAGNOSIS — L719 Rosacea, unspecified: Secondary | ICD-10-CM | POA: Diagnosis not present

## 2021-12-19 ENCOUNTER — Other Ambulatory Visit: Payer: Self-pay | Admitting: Internal Medicine

## 2021-12-20 ENCOUNTER — Other Ambulatory Visit: Payer: BC Managed Care – PPO

## 2021-12-26 ENCOUNTER — Ambulatory Visit
Admission: RE | Admit: 2021-12-26 | Discharge: 2021-12-26 | Disposition: A | Discharge status: Home / Self Care | Payer: BC Managed Care – PPO | Source: Ambulatory Visit | Attending: Surgery | Admitting: Surgery

## 2021-12-26 DIAGNOSIS — Z1239 Encounter for other screening for malignant neoplasm of breast: Secondary | ICD-10-CM

## 2021-12-28 ENCOUNTER — Ambulatory Visit
Admission: RE | Admit: 2021-12-28 | Discharge: 2021-12-28 | Disposition: A | Discharge status: Home / Self Care | Payer: BC Managed Care – PPO | Source: Ambulatory Visit | Attending: Surgery | Admitting: Surgery

## 2021-12-28 DIAGNOSIS — N6489 Other specified disorders of breast: Secondary | ICD-10-CM | POA: Diagnosis not present

## 2021-12-28 DIAGNOSIS — Z803 Family history of malignant neoplasm of breast: Secondary | ICD-10-CM | POA: Diagnosis not present

## 2021-12-28 MED ORDER — GADOBUTROL 1 MMOL/ML IV SOLN
9.0000 mL | Freq: Once | INTRAVENOUS | Status: AC | PRN
  Administered 2021-12-28: 9 mL via INTRAVENOUS

## 2022-02-07 ENCOUNTER — Other Ambulatory Visit: Payer: Self-pay | Admitting: Internal Medicine

## 2022-02-10 ENCOUNTER — Encounter: Payer: Self-pay | Admitting: Internal Medicine

## 2022-02-10 MED ORDER — ROSUVASTATIN CALCIUM 10 MG PO TABS: 10.0000 mg | ORAL_TABLET | Freq: Every day | ORAL | 3 refills | Status: DC

## 2022-02-10 NOTE — Telephone Encounter (Signed)
Rx sent to the pharmacy.

## 2022-02-13 ENCOUNTER — Encounter: Payer: Self-pay | Admitting: Internal Medicine

## 2022-02-17 ENCOUNTER — Ambulatory Visit: Payer: BC Managed Care – PPO | Admitting: Internal Medicine

## 2022-02-17 ENCOUNTER — Encounter: Payer: Self-pay | Admitting: Internal Medicine

## 2022-02-17 VITALS — BP 102/76 | HR 82 | Temp 98.8°F | Ht 67.5 in | Wt 197.4 lb

## 2022-02-17 DIAGNOSIS — H6502 Acute serous otitis media, left ear: Secondary | ICD-10-CM | POA: Diagnosis not present

## 2022-02-17 MED ORDER — METHYLPREDNISOLONE ACETATE 80 MG/ML IJ SUSP
80.0000 mg | Freq: Once | INTRAMUSCULAR | Status: AC
  Administered 2022-02-17: 80 mg via INTRAMUSCULAR

## 2022-02-17 MED ORDER — AZITHROMYCIN 250 MG PO TABS: ORAL_TABLET | ORAL | 0 refills | Status: AC

## 2022-02-17 NOTE — Progress Notes (Unsigned)
   Subjective:    Patient ID: Lisa Golden, female    DOB: 04-Mar-1976, 46 y.o.   MRN: [id number]  HPI    Review of Systems     Objective:   Physical Exam        Assessment & Plan:

## 2022-02-18 NOTE — Patient Instructions (Signed)
It was a pleasure to see you today.  Your hearing in each ear is normal.  This is reassuring.  Take Zithromax Z-PAK 2 tabs day 1 followed by 1 tab days 2 through 5.  Depo-Medrol 80 mg IM given for ear congestion.  Call if not improving in 4 to 5 days or sooner if worse.

## 2022-03-27 ENCOUNTER — Telehealth: Payer: Self-pay | Admitting: Internal Medicine

## 2022-03-27 ENCOUNTER — Encounter: Payer: Self-pay | Admitting: Internal Medicine

## 2022-03-27 ENCOUNTER — Ambulatory Visit: Payer: BC Managed Care – PPO | Admitting: Internal Medicine

## 2022-03-27 VITALS — BP 116/70 | HR 84 | Temp 98.3°F

## 2022-03-27 DIAGNOSIS — J0100 Acute maxillary sinusitis, unspecified: Secondary | ICD-10-CM

## 2022-03-27 DIAGNOSIS — H6502 Acute serous otitis media, left ear: Secondary | ICD-10-CM | POA: Diagnosis not present

## 2022-03-27 MED ORDER — AZITHROMYCIN 250 MG PO TABS: ORAL_TABLET | ORAL | 0 refills | Status: AC

## 2022-03-27 NOTE — Patient Instructions (Signed)
Zithromax Z-Pak 2 tabs day 1 followed by 1 tab days 2 through 5.  May take over-the-counter decongestant if needed.  Rest and stay well-hydrated.  Call if not better in 1 week or sooner if worse.

## 2022-03-27 NOTE — Telephone Encounter (Signed)
COVID test was negative.

## 2022-03-27 NOTE — Telephone Encounter (Signed)
Lisa Golden [phone]  [name] called to say she has been sick for several day, she has cough, runny nose, head and chest congestion, sneezing, she done a COVID test last Friday that was negative, I have ask her to take another one before coming into office. Scheduled her at 11:30

## 2022-03-27 NOTE — Progress Notes (Signed)
   Subjective:    Patient ID: Lisa Golden, female    DOB: 20-May-1975, 45 y.o.   MRN: [id number]  HPI 46 year old Female seen today with an upper respiratory infection.  COVID test this past Friday was negative according to patient.  She says she has been ill for several days.  Complains of cough, rhinorrhea, head and chest congestion, sneezing.  Was seen in October for an acute left serous otitis media treated with Zithromax and Depo-Medrol.  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  She saw Dr. Jana Hakim and he reviewed her data.  He indicated she had an elevated risk of developing breast cancer in her lifetime in the 30% range.  Tamoxifen was recommended for risk reduction.  Longstanding history of anxiety and depression.  She is a non-smoker.  Does not consume alcohol.  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  She has a teenage son from a previous relationship.  She is currently married.  Non-smoker.  Does not consume alcohol. . Review of Systems scratchy throat, teeth hurt , sinus drainage, sinus pressure     Objective:   Physical Exam Blood pressure 116/70, pulse 84 regular temperature 98.3 degrees pulse oximetry 99% on room air Skin slightly pale, warm and dry.  No cervical adenopathy.  Left TM slightly full.  Right TM clear.  Chest clear to auscultation without rales or wheezing.      Assessment & Plan:   Acute left serous otitis media  Plan: Zithromax Z-PAK 2 tabs day 1 followed by 1 tab days 2 through 5.  May take over-the-counter decongestant if needed.  Rest and stay well-hydrated.  Call if not better in 1 week or sooner if worse.

## 2022-05-29 DIAGNOSIS — H31002 Unspecified chorioretinal scars, left eye: Secondary | ICD-10-CM | POA: Diagnosis not present

## 2022-05-29 DIAGNOSIS — H43812 Vitreous degeneration, left eye: Secondary | ICD-10-CM | POA: Diagnosis not present

## 2022-06-05 DIAGNOSIS — J209 Acute bronchitis, unspecified: Secondary | ICD-10-CM | POA: Diagnosis not present

## 2022-06-05 DIAGNOSIS — J069 Acute upper respiratory infection, unspecified: Secondary | ICD-10-CM | POA: Diagnosis not present

## 2022-06-05 DIAGNOSIS — H9201 Otalgia, right ear: Secondary | ICD-10-CM | POA: Diagnosis not present

## 2022-06-08 ENCOUNTER — Ambulatory Visit: Payer: BC Managed Care – PPO | Admitting: Internal Medicine

## 2022-06-08 ENCOUNTER — Encounter: Payer: Self-pay | Admitting: Internal Medicine

## 2022-06-08 ENCOUNTER — Telehealth: Payer: Self-pay | Admitting: Internal Medicine

## 2022-06-08 VITALS — BP 108/72 | HR 119 | Temp 99.5°F

## 2022-06-08 DIAGNOSIS — J0100 Acute maxillary sinusitis, unspecified: Secondary | ICD-10-CM | POA: Diagnosis not present

## 2022-06-08 DIAGNOSIS — H6693 Otitis media, unspecified, bilateral: Secondary | ICD-10-CM | POA: Diagnosis not present

## 2022-06-08 MED ORDER — AZITHROMYCIN 250 MG PO TABS: ORAL_TABLET | ORAL | 0 refills | Status: AC

## 2022-06-08 MED ORDER — METHYLPREDNISOLONE ACETATE 80 MG/ML IJ SUSP
80.0000 mg | Freq: Once | INTRAMUSCULAR | Status: AC
  Administered 2022-06-08: 80 mg via INTRAMUSCULAR

## 2022-06-08 NOTE — Progress Notes (Signed)
Patient Care Team: Elby Showers, MD as PCP - General (Internal Medicine) Donnel Saxon, CNM as Consulting Physician (Obstetrics and Gynecology) Erroll Luna, MD as Consulting Physician (General Surgery) Magrinat, Virgie Dad, MD (Inactive) as Consulting Physician (Oncology)  Visit Date: 06/08/22  Subjective:    Patient ID: Lisa Golden , Female   DOB: 1976-03-09, 47 y.o.    MRN: [id number]   47 y.o. Female presents today for cough and congestion. Patient has a past medical history of anxiety and depression, Covid-19.  She reports experiencing congestion for 2 weeks and a cough for 1 week. Reports intermittent fever with the last one being a few days ago. Reports that the cough is worse than the congestion. She is taking Best boy with no relief, as well as Aleve and Robitussin.   She reports a negative Covid-19 test this morning.    Past Medical History:  Diagnosis Date   Anxiety    Depression    Family history of brain cancer    Family history of breast cancer    Family history of melanoma    History of COVID-19 04/21/2019     Family History  Problem Relation Age of Onset   Breast cancer Mother 27   Gout Father    Kidney Stones Father    Breast cancer Maternal Grandmother 59   Brain cancer Paternal Uncle        dx. early 60s    Social History   Social History Narrative   Not on file      Review of Systems  Constitutional:  Positive for fever. Negative for chills and malaise/fatigue.  HENT:  Positive for congestion, ear pain (Bilateral) and sinus pain.   Eyes:  Negative for blurred vision.  Respiratory:  Positive for cough, hemoptysis and sputum production. Negative for shortness of breath.   Cardiovascular:  Negative for chest pain, palpitations and leg swelling.  Gastrointestinal:  Negative for nausea and vomiting.  Musculoskeletal:  Negative for back pain.  Skin:  Negative for rash.  Neurological:  Negative for loss of consciousness and  headaches.        Objective:   Vitals: BP 108/72   Pulse (!) 119   Temp 99.5 F (37.5 C) (Tympanic)   LMP 05/12/2022   SpO2 98%    Physical Exam Vitals and nursing note reviewed.  Constitutional:      General: She is not in acute distress.    Appearance: Normal appearance. She is not ill-appearing or toxic-appearing.  HENT:     Head: Normocephalic and atraumatic.     Right Ear: Tympanic membrane is injected.     Ears:     Comments: Left TM dull with splayed reflex. Right TM dull and injected.    Mouth/Throat:     Pharynx: No oropharyngeal exudate.     Comments: Throat injected with no exudate. Pulmonary:     Effort: Pulmonary effort is normal.  Abdominal:     General: There is no distension.  Skin:    General: Skin is warm and dry.  Neurological:     Mental Status: She is alert and oriented to person, place, and time. Mental status is at baseline.  Psychiatric:        Mood and Affect: Mood normal.        Behavior: Behavior normal.        Thought Content: Thought content normal.        Judgment: Judgment normal.  Results:   Studies obtained and personally reviewed by me:   Labs:       Component Value Date/Time   NA 136 07/27/2021 1127   K 3.7 07/27/2021 1127   CL 104 07/27/2021 1127   CO2 23 07/27/2021 1127   GLUCOSE 115 (H) 07/27/2021 1127   BUN 10 07/27/2021 1127   CREATININE 0.82 07/27/2021 1127   CREATININE 0.75 07/15/2021 1107   CALCIUM 9.3 07/27/2021 1127   PROT 7.0 07/15/2021 1107   ALBUMIN 4.4 06/09/2019 1100   AST 15 07/15/2021 1107   ALT 14 07/15/2021 1107   ALKPHOS 56 06/09/2019 1100   BILITOT 0.7 07/15/2021 1107   GFRNONAA >60 07/27/2021 1127   GFRNONAA 107 07/12/2020 1138   GFRAA 124 07/12/2020 1138     Lab Results  Component Value Date   WBC 6.6 07/27/2021   HGB 12.4 07/27/2021   HCT 38.0 07/27/2021   MCV 90.7 07/27/2021   PLT 293 07/27/2021    Lab Results  Component Value Date   CHOL 190 07/15/2021   HDL 63  07/15/2021   LDLCALC 104 (H) 07/15/2021   TRIG 131 07/15/2021   CHOLHDL 3.0 07/15/2021    Lab Results  Component Value Date   HGBA1C 5.8 (H) 07/15/2021     Lab Results  Component Value Date   TSH 1.53 07/15/2021      Assessment & Plan:   Acute Maxillary Sinusitis/Bilateral Otitis Media: Prescribed Zithromax Z-PAK 2 tabs day 1 followed by 1 tab days 2 through 5.  Depo-Medrol 80 mg IM given for respiratory congestion.   I,Alexander Ruley,acting as a Education administrator for Elby Showers, MD.,have documented all relevant documentation on the behalf of Elby Showers, MD,as directed by  Elby Showers, MD while in the presence of Elby Showers, MD.   I, Elby Showers, MD, have reviewed all documentation for this visit. The documentation on 06/18/22 for the exam, diagnosis, procedures, and orders are all accurate and complete.

## 2022-06-08 NOTE — Telephone Encounter (Signed)
Lisa Golden,  [phone]  Lisa Golden called to say she has coughing, head congestion, runny nose, she has a bloody mucus, when this started last Thursday she had sore throat, ears hurt. She did do a evisit, but she is not getting any better has been out of work all week. COVID test was negative today, Scheduled her to come in at 11:00

## 2022-06-18 NOTE — Patient Instructions (Addendum)
You have been diagnosed with acute bilateral otitis media and maxillary sinusitis. A Zithromax Z-PAK is been prescribed starting with 2 tabs day 1 followed by 1 tab days 2 through 5.  Depo-Medrol 80 mg IM given for respiratory congestion which should help decongest your ears as well.  Please call back if not better in 48 to 72 hours or sooner if worse.

## 2022-07-13 NOTE — Progress Notes (Signed)
Patient Care Team: Elby Showers, MD as PCP - General (Internal Medicine) Donnel Saxon, CNM as Consulting Physician (Obstetrics and Gynecology) Erroll Luna, MD as Consulting Physician (General Surgery) Magrinat, Virgie Dad, MD (Inactive) as Consulting Physician (Oncology)  Visit Date: 07/20/22  Subjective:    Patient ID: Lisa Golden , Female   DOB: 1975/12/05, 47 y.o.    MRN: [id number]   47 y.o. Female presents today for annual comprehensive physical exam. Patient has a past medical history of anxiety and depression, Covid-19, glucose intolerance, elevated cholesterol, breast lumpectomy.  History of glucose intolerance. HGBA1c at 6.1% on 07/17/22. Does not check her blood glucose at home but is willing to start doing that. Interested in going to a weight loss clinic for weight management and glucose intolerance.  History of elevated cholesterol treated with Crestor 10 mg daily. Lipid panel normal on 07/17/22.  History of anxiety and depression treated with Prozac 20 mg daily, Xanax 0.5 mg three times daily as needed.  Kidney, thyroid function normal. Blood proteins normal.   Pap smear last completed 09/14/11. Negative for intraepithelial lesions or malignancy. Sees her gynecologist in May. Pelvic exam deferred.  Mammogram last completed 12/28/21. Stable appearance of the non mass enhancement in the retroareolar left breast, previously biopsied and shown to be benign. No evidence of malignancy in the bilateral breasts. Screening mammography due in 1/24. Taking Nolvadex 20 mg daily. Status post left lumpectomy with radioactive seed localization by Dr. Erroll Luna. Her mother was diagnosed with breast cancer in her 29s.  Colonoscopy last completed 08/19/2021. Pathology shows one hyperplastic polyp and one sessile serrated adenoma. Recommended repeat in 2026.   Past Medical History:  Diagnosis Date   Anxiety    Depression    Family history of brain cancer    Family history  of breast cancer    Family history of melanoma    History of COVID-19 04/21/2019     Family History  Problem Relation Age of Onset   Breast cancer Mother 79   Gout Father    Kidney Stones Father    Breast cancer Maternal Grandmother 46   Brain cancer Paternal Uncle        dx. early 79s    Social History   Social History Narrative   Not on file      Review of Systems  Constitutional:  Negative for chills, fever, malaise/fatigue and weight loss.  HENT:  Negative for hearing loss, sinus pain and sore throat.   Respiratory:  Negative for cough and hemoptysis.   Cardiovascular:  Negative for chest pain, palpitations, leg swelling and PND.  Gastrointestinal:  Negative for abdominal pain, constipation, diarrhea, heartburn, nausea and vomiting.  Genitourinary:  Negative for dysuria, frequency and urgency.  Musculoskeletal:  Negative for back pain, myalgias and neck pain.  Skin:  Negative for itching and rash.  Neurological:  Negative for dizziness, tingling, seizures and headaches.  Endo/Heme/Allergies:  Negative for polydipsia.  Psychiatric/Behavioral:  Negative for depression. The patient is not nervous/anxious.         Objective:   Vitals: BP 104/80   Pulse 88   Temp 98.8 F (37.1 C) (Tympanic)   Ht 5' 7.75" (1.721 m)   Wt 190 lb 1.9 oz (86.2 kg)   LMP 07/11/2022   SpO2 99%   BMI 29.12 kg/m    Physical Exam Vitals and nursing note reviewed.  Constitutional:      General: She is not in acute distress.  Appearance: Normal appearance. She is not ill-appearing or toxic-appearing.  HENT:     Head: Normocephalic and atraumatic.     Right Ear: Hearing, tympanic membrane, ear canal and external ear normal.     Left Ear: Hearing, tympanic membrane, ear canal and external ear normal.     Mouth/Throat:     Pharynx: Oropharynx is clear.  Eyes:     Extraocular Movements: Extraocular movements intact.     Pupils: Pupils are equal, round, and reactive to light.  Neck:      Thyroid: No thyroid mass, thyromegaly or thyroid tenderness.     Vascular: No carotid bruit.  Cardiovascular:     Rate and Rhythm: Normal rate and regular rhythm. No extrasystoles are present.    Pulses: Normal pulses.          Dorsalis pedis pulses are 2+ on the right side and 2+ on the left side.       Posterior tibial pulses are 2+ on the right side and 2+ on the left side.     Heart sounds: Normal heart sounds. No murmur heard.    No friction rub. No gallop.  Pulmonary:     Effort: Pulmonary effort is normal.     Breath sounds: Normal breath sounds. No decreased breath sounds, wheezing, rhonchi or rales.  Chest:     Chest wall: No mass.  Abdominal:     Palpations: Abdomen is soft. There is no hepatomegaly, splenomegaly or mass.     Tenderness: There is no abdominal tenderness.     Hernia: No hernia is present.  Musculoskeletal:     Cervical back: Normal range of motion.     Right lower leg: No edema.     Left lower leg: No edema.  Lymphadenopathy:     Cervical: No cervical adenopathy.     Upper Body:     Right upper body: No supraclavicular adenopathy.     Left upper body: No supraclavicular adenopathy.  Skin:    General: Skin is warm and dry.  Neurological:     General: No focal deficit present.     Mental Status: She is alert and oriented to person, place, and time. Mental status is at baseline.     Sensory: Sensation is intact.     Motor: Motor function is intact. No weakness.     Deep Tendon Reflexes: Reflexes are normal and symmetric.  Psychiatric:        Attention and Perception: Attention normal.        Mood and Affect: Mood normal.        Speech: Speech normal.        Behavior: Behavior normal.        Thought Content: Thought content normal.        Cognition and Memory: Cognition normal.        Judgment: Judgment normal.       Results:   Studies obtained and personally reviewed by me:  Pap smear last completed 09/14/11. Negative for intraepithelial  lesions or malignancy. Sees her gynecologist in May. Pelvic exam deferred.  Mammogram last completed 12/28/21. Stable appearance of the non mass enhancement in the retroareolar left breast, previously biopsied and shown to be benign. No evidence of malignancy in the bilateral breasts. Screening mammography due in 1/24.  Colonoscopy last completed 08/19/2021. Pathology shows one hyperplastic polyp and one sessile serrated adenoma. Recommended repeat in 2026.   Labs:       Component Value Date/Time   NA  139 07/17/2022 0909   K 3.8 07/17/2022 0909   CL 104 07/17/2022 0909   CO2 25 07/17/2022 0909   GLUCOSE 110 (H) 07/17/2022 0909   BUN 11 07/17/2022 0909   CREATININE 0.73 07/17/2022 0909   CALCIUM 9.1 07/17/2022 0909   PROT 7.2 07/17/2022 0909   ALBUMIN 4.4 06/09/2019 1100   AST 15 07/17/2022 0909   ALT 13 07/17/2022 0909   ALKPHOS 56 06/09/2019 1100   BILITOT 0.5 07/17/2022 0909   GFRNONAA >60 07/27/2021 1127   GFRNONAA 107 07/12/2020 1138   GFRAA 124 07/12/2020 1138     Lab Results  Component Value Date   WBC 6.6 07/17/2022   HGB 12.1 07/17/2022   HCT 36.4 07/17/2022   MCV 89.9 07/17/2022   PLT 284 07/17/2022    Lab Results  Component Value Date   CHOL 172 07/17/2022   HDL 68 07/17/2022   LDLCALC 83 07/17/2022   TRIG 111 07/17/2022   CHOLHDL 2.5 07/17/2022    Lab Results  Component Value Date   HGBA1C 6.1 (H) 07/17/2022     Lab Results  Component Value Date   TSH 1.39 07/17/2022    Assessment & Plan:   Glucose intolerance: HGBA1c at 6.1% on 07/17/22. Advised to start checking blood glucose at home and call us with readings. Referral for Endocrinologist for weight management and glucose intolerance.BMI 29  Elevated cholesterol: treated with Crestor 10 mg daily. Lipid panel normal on 07/17/22.  Anxiety and depression: treated with Prozac 20 mg daily, Xanax 0.5 mg three times daily as needed.  Pap smear: last completed 09/14/11. Negative for intraepithelial  lesions or malignancy. Sees her Gynecologist in May. Pelvic exam deferred.  Mammogram: last completed 12/28/21. Stable appearance of the non mass enhancement in the retroareolar left breast, previously biopsied and shown to be benign. No evidence of malignancy in the bilateral breasts. Screening mammography due in 1/24. Wants referral for breast cancer counseling. Wants to discuss coming off Tamoxifen. Has been on Tamoxifen since 2021. Family hx breast cancer in mother and maternal grandmother.  Colonoscopy: last completed 08/19/2021. Pathology shows one hyperplastic polyp and one sessile serrated adenoma. Recommended repeat in 2026.  Vaccine Counseling: UTD on tetanus vaccine. Declines flu vaccine.    I,Alexander Ruley,acting as a Education administrator for Elby Showers, MD.,have documented all relevant documentation on the behalf of Elby Showers, MD,as directed by  Elby Showers, MD while in the presence of Elby Showers, MD.   I, Elby Showers, MD, have reviewed all documentation for this visit. The documentation on 08/04/22 for the exam, diagnosis, procedures, and orders are all accurate and complete.

## 2022-07-17 ENCOUNTER — Other Ambulatory Visit: Payer: BC Managed Care – PPO

## 2022-07-17 DIAGNOSIS — F32A Depression, unspecified: Secondary | ICD-10-CM | POA: Diagnosis not present

## 2022-07-17 DIAGNOSIS — R5383 Other fatigue: Secondary | ICD-10-CM

## 2022-07-17 DIAGNOSIS — R7309 Other abnormal glucose: Secondary | ICD-10-CM

## 2022-07-17 DIAGNOSIS — E782 Mixed hyperlipidemia: Secondary | ICD-10-CM | POA: Diagnosis not present

## 2022-07-17 DIAGNOSIS — F419 Anxiety disorder, unspecified: Secondary | ICD-10-CM | POA: Diagnosis not present

## 2022-07-18 NOTE — Addendum Note (Signed)
Addended by: Geradine Girt D on: 07/18/2022 12:55 PM   Modules accepted: Orders

## 2022-07-19 LAB — COMPLETE METABOLIC PANEL WITH GFR
AG Ratio: 1.4 (calc) (ref 1.0–2.5)
ALT: 13 U/L (ref 6–29)
AST: 15 U/L (ref 10–35)
Albumin: 4.2 g/dL (ref 3.6–5.1)
Alkaline phosphatase (APISO): 52 U/L (ref 31–125)
BUN: 11 mg/dL (ref 7–25)
CO2: 25 mmol/L (ref 20–32)
Calcium: 9.1 mg/dL (ref 8.6–10.2)
Chloride: 104 mmol/L (ref 98–110)
Creat: 0.73 mg/dL (ref 0.50–0.99)
Globulin: 3 g/dL (calc) (ref 1.9–3.7)
Glucose, Bld: 110 mg/dL — ABNORMAL HIGH (ref 65–99)
Potassium: 3.8 mmol/L (ref 3.5–5.3)
Sodium: 139 mmol/L (ref 135–146)
Total Bilirubin: 0.5 mg/dL (ref 0.2–1.2)
Total Protein: 7.2 g/dL (ref 6.1–8.1)
eGFR: 102 mL/min/{1.73_m2} (ref >=60)

## 2022-07-19 LAB — LIPID PANEL
Cholesterol: 172 mg/dL (ref <200)
HDL: 68 mg/dL (ref >=50)
LDL Cholesterol (Calc): 83 mg/dL (calc)
Non-HDL Cholesterol (Calc): 104 mg/dL (calc) (ref <130)
Total CHOL/HDL Ratio: 2.5 (calc) (ref <5.0)
Triglycerides: 111 mg/dL (ref <150)

## 2022-07-19 LAB — CBC WITH DIFFERENTIAL/PLATELET
Absolute Monocytes: 502 cells/uL (ref 200–950)
Basophils Absolute: 33 cells/uL (ref 0–200)
Basophils Relative: 0.5 %
Eosinophils Absolute: 40 cells/uL (ref 15–500)
Eosinophils Relative: 0.6 %
HCT: 36.4 % (ref 35.0–45.0)
Hemoglobin: 12.1 g/dL (ref 11.7–15.5)
Lymphs Abs: 1954 cells/uL (ref 850–3900)
MCH: 29.9 pg (ref 27.0–33.0)
MCHC: 33.2 g/dL (ref 32.0–36.0)
MCV: 89.9 fL (ref 80.0–100.0)
MPV: 11.5 fL (ref 7.5–12.5)
Monocytes Relative: 7.6 %
Neutro Abs: 4072 cells/uL (ref 1500–7800)
Neutrophils Relative %: 61.7 %
Platelets: 284 10*3/uL (ref 140–400)
RBC: 4.05 10*6/uL (ref 3.80–5.10)
RDW: 12.3 % (ref 11.0–15.0)
Total Lymphocyte: 29.6 %
WBC: 6.6 10*3/uL (ref 3.8–10.8)

## 2022-07-19 LAB — TEST AUTHORIZATION

## 2022-07-19 LAB — HEMOGLOBIN A1C W/OUT EAG: Hgb A1c MFr Bld: 6.1 % of total Hgb — ABNORMAL HIGH (ref <5.7)

## 2022-07-19 LAB — TSH: TSH: 1.39 mIU/L

## 2022-07-20 ENCOUNTER — Ambulatory Visit (INDEPENDENT_AMBULATORY_CARE_PROVIDER_SITE_OTHER): Payer: BC Managed Care – PPO | Admitting: Internal Medicine

## 2022-07-20 ENCOUNTER — Encounter: Payer: Self-pay | Admitting: Internal Medicine

## 2022-07-20 VITALS — BP 104/80 | HR 88 | Temp 98.8°F | Ht 67.75 in | Wt 190.1 lb

## 2022-07-20 DIAGNOSIS — F32A Depression, unspecified: Secondary | ICD-10-CM

## 2022-07-20 DIAGNOSIS — R7302 Impaired glucose tolerance (oral): Secondary | ICD-10-CM

## 2022-07-20 DIAGNOSIS — Z8249 Family history of ischemic heart disease and other diseases of the circulatory system: Secondary | ICD-10-CM | POA: Diagnosis not present

## 2022-07-20 DIAGNOSIS — Z0000 Encounter for general adult medical examination without abnormal findings: Secondary | ICD-10-CM

## 2022-07-20 DIAGNOSIS — E782 Mixed hyperlipidemia: Secondary | ICD-10-CM

## 2022-07-20 DIAGNOSIS — F419 Anxiety disorder, unspecified: Secondary | ICD-10-CM

## 2022-07-20 DIAGNOSIS — Z6829 Body mass index (BMI) 29.0-29.9, adult: Secondary | ICD-10-CM

## 2022-07-20 LAB — POCT URINALYSIS DIPSTICK
Bilirubin, UA: NEGATIVE
Blood, UA: NEGATIVE
Glucose, UA: NEGATIVE
Ketones, UA: NEGATIVE
Leukocytes, UA: NEGATIVE
Nitrite, UA: NEGATIVE
Protein, UA: NEGATIVE
Spec Grav, UA: 1.01 (ref 1.010–1.025)
Urobilinogen, UA: 0.2 E.U./dL
pH, UA: 7 (ref 5.0–8.0)

## 2022-07-20 NOTE — Patient Instructions (Addendum)
Referral for breast cancer counseling.  Wants to discuss coming off Tamoxifen. Has been on Tamoxifen since 2021. Hereditary testing was not done by Oncology in 202. Family history of breast cancer maternal grandmother and mother. Refer to Endocrine for weight loss management and glucose control.

## 2022-07-26 DIAGNOSIS — J069 Acute upper respiratory infection, unspecified: Secondary | ICD-10-CM | POA: Diagnosis not present

## 2022-07-26 DIAGNOSIS — R059 Cough, unspecified: Secondary | ICD-10-CM | POA: Diagnosis not present

## 2022-07-27 ENCOUNTER — Telehealth: Payer: Self-pay

## 2022-07-27 ENCOUNTER — Encounter: Payer: Self-pay | Admitting: Internal Medicine

## 2022-07-27 ENCOUNTER — Ambulatory Visit: Payer: BC Managed Care – PPO | Admitting: Internal Medicine

## 2022-07-27 VITALS — BP 116/76 | HR 93 | Temp 99.1°F

## 2022-07-27 DIAGNOSIS — H6693 Otitis media, unspecified, bilateral: Secondary | ICD-10-CM

## 2022-07-27 DIAGNOSIS — J0100 Acute maxillary sinusitis, unspecified: Secondary | ICD-10-CM | POA: Diagnosis not present

## 2022-07-27 DIAGNOSIS — A084 Viral intestinal infection, unspecified: Secondary | ICD-10-CM

## 2022-07-27 MED ORDER — AZITHROMYCIN 250 MG PO TABS: ORAL_TABLET | ORAL | 0 refills | Status: AC

## 2022-07-27 NOTE — Telephone Encounter (Signed)
Patient called stating she believes she has some type of virus with coughing and congestion.  She said she had a stomach virus on Sunday- Monday with vomit and diarrhea.  She thought her symptoms are connected to spring allergies and has been taking Mucinex. She did say she had a virtual visit yesterday and was prescribed a nasal spray. Covid test was taken a couple days ago with negative results I ask patient to take a covid test and call the office back

## 2022-07-27 NOTE — Telephone Encounter (Signed)
Lisa Golden called back to say COVID test was negative, so I scheduled her for 2:00 today, ask to wear mask.

## 2022-07-27 NOTE — Progress Notes (Signed)
Patient Care Team: Elby Showers, MD as PCP - General (Internal Medicine) Donnel Saxon, CNM as Consulting Physician (Obstetrics and Gynecology) Erroll Luna, MD as Consulting Physician (General Surgery) Magrinat, Virgie Dad, MD (Inactive) as Consulting Physician (Oncology)  Visit Date: 07/27/22  Subjective:    Patient ID: Lisa Golden , Female   DOB: 28-May-1975, 47 y.o.    MRN: [id number]   47 y.o. Female presents today for cough, congestion. Patient has a past medical history of anxiety and depression, Covid-19, glucose intolerance, elevated cholesterol, breast lumpectomy.  Reports experiencing productive cough with yellow sputum, green nasal discharge, congestion since 07/21/22. Was sick with viral gastroenteritis from 07/23/22 - 07/25/22. Symptoms of the stomach illness were nausea, vomiting, diarrhea. Had telemedicine visit yesterday and received prescription nasal spray that she has had difficulty using due to her congestion. Also given Ladona Ridgel from provider yesterday. Had a low-grade fever on 07/23/22 and 07/24/22.    Past Medical History:  Diagnosis Date   Anxiety    Depression    Family history of brain cancer    Family history of breast cancer    Family history of melanoma    History of COVID-19 04/21/2019     Family History  Problem Relation Age of Onset   Breast cancer Mother 86   Gout Father    Kidney Stones Father    Breast cancer Maternal Grandmother 61   Brain cancer Paternal Uncle        dx. 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 Non-smoker. Does not consume alcohol     Review of Systems  Constitutional:  Negative for chills, fever and malaise/fatigue.  HENT:  Positive for congestion.        (+) Green nasal discharge  Eyes:  Negative for blurred vision.  Respiratory:  Positive for cough and sputum production (Yellow). Negative for shortness of breath.   Cardiovascular:  Negative for chest pain, palpitations and  leg swelling.  Gastrointestinal:  Negative for vomiting.  Musculoskeletal:  Negative for back pain.  Skin:  Negative for rash.  Neurological:  Negative for loss of consciousness and headaches.        Objective:   Vitals: BP 116/76   Pulse 93   Temp 99.1 F (37.3 C) (Tympanic)   LMP 07/11/2022   SpO2 99%    Physical Exam Vitals and nursing note reviewed.  Constitutional:      General: She is not in acute distress.    Appearance: Normal appearance. She is not toxic-appearing.  HENT:     Head: Normocephalic and atraumatic.     Right Ear: Hearing, ear canal and external ear normal. Tympanic membrane is injected (Centrally) and erythematous.     Left Ear: Hearing, ear canal and external ear normal.     Ears:     Comments: Right TM full, erythematous, centrally injected. Left TM slightly full, no erythema.     Mouth/Throat:     Pharynx: No oropharyngeal exudate.     Comments: Injected without exudate. Pulmonary:     Effort: Pulmonary effort is normal. No respiratory distress.     Breath sounds: Normal breath sounds. No wheezing or rales.  Skin:    General: Skin is warm and dry.  Neurological:     Mental Status: She is alert and oriented to person, place, and time. Mental status is at baseline.  Psychiatric:        Mood and Affect: Mood normal.  Behavior: Behavior normal.        Thought Content: Thought content normal.        Judgment: Judgment normal.       Results:   Studies obtained and personally reviewed by me:    Labs:       Component Value Date/Time   NA 139 07/17/2022 0909   K 3.8 07/17/2022 0909   CL 104 07/17/2022 0909   CO2 25 07/17/2022 0909   GLUCOSE 110 (H) 07/17/2022 0909   BUN 11 07/17/2022 0909   CREATININE 0.73 07/17/2022 0909   CALCIUM 9.1 07/17/2022 0909   PROT 7.2 07/17/2022 0909   ALBUMIN 4.4 06/09/2019 1100   AST 15 07/17/2022 0909   ALT 13 07/17/2022 0909   ALKPHOS 56 06/09/2019 1100   BILITOT 0.5 07/17/2022 0909    GFRNONAA >60 07/27/2021 1127   GFRNONAA 107 07/12/2020 1138   GFRAA 124 07/12/2020 1138     Lab Results  Component Value Date   WBC 6.6 07/17/2022   HGB 12.1 07/17/2022   HCT 36.4 07/17/2022   MCV 89.9 07/17/2022   PLT 284 07/17/2022    Lab Results  Component Value Date   CHOL 172 07/17/2022   HDL 68 07/17/2022   LDLCALC 83 07/17/2022   TRIG 111 07/17/2022   CHOLHDL 2.5 07/17/2022    Lab Results  Component Value Date   HGBA1C 6.1 (H) 07/17/2022     Lab Results  Component Value Date   TSH 1.39 07/17/2022      Assessment & Plan:   Acute maxillary sinusitis, bilateral otitis media: prescribed azithromycin Z-Pak two tabs day 1 followed by one tab days 2-5.  Acute gastroenteritis -- possible norovirus: no longer symptomatic. Was experiencing symptoms of nausea, vomiting, diarrhea from 07/23/22 - 07/25/22.    I,Alexander Ruley,acting as a Education administrator for Elby Showers, MD.,have documented all relevant documentation on the behalf of Elby Showers, MD,as directed by  Elby Showers, MD while in the presence of Elby Showers, MD.   I, Elby Showers, MD, have reviewed all documentation for this visit. The documentation on 07/30/22 for the exam, diagnosis, procedures, and orders are all accurate and complete.

## 2022-07-30 NOTE — Patient Instructions (Signed)
You have been diagnosed with acute maxillary sinusitis and bilateral otitis media.  Please take Zithromax Z-PAK 2 tabs day 1 followed by 1 tab days 2 through 5.  Recent diarrhea episode was likely due to viral agent perhaps norovirus.  We are glad you are feeling better from that.

## 2022-08-04 DIAGNOSIS — Z6829 Body mass index (BMI) 29.0-29.9, adult: Secondary | ICD-10-CM | POA: Insufficient documentation

## 2022-08-04 DIAGNOSIS — R7302 Impaired glucose tolerance (oral): Secondary | ICD-10-CM | POA: Insufficient documentation

## 2022-08-08 ENCOUNTER — Other Ambulatory Visit: Payer: Self-pay | Admitting: Internal Medicine

## 2022-08-08 ENCOUNTER — Other Ambulatory Visit: Payer: Self-pay

## 2022-08-08 ENCOUNTER — Inpatient Hospital Stay: Payer: BC Managed Care – PPO | Attending: Hematology and Oncology | Admitting: Hematology and Oncology

## 2022-08-08 VITALS — BP 131/77 | HR 92 | Temp 97.2°F | Resp 18 | Ht 67.75 in | Wt 190.4 lb

## 2022-08-08 DIAGNOSIS — N6489 Other specified disorders of breast: Secondary | ICD-10-CM | POA: Insufficient documentation

## 2022-08-08 DIAGNOSIS — F419 Anxiety disorder, unspecified: Secondary | ICD-10-CM | POA: Insufficient documentation

## 2022-08-08 DIAGNOSIS — F32A Depression, unspecified: Secondary | ICD-10-CM | POA: Diagnosis not present

## 2022-08-08 DIAGNOSIS — Z841 Family history of disorders of kidney and ureter: Secondary | ICD-10-CM | POA: Insufficient documentation

## 2022-08-08 DIAGNOSIS — Z79899 Other long term (current) drug therapy: Secondary | ICD-10-CM | POA: Insufficient documentation

## 2022-08-08 DIAGNOSIS — Z1239 Encounter for other screening for malignant neoplasm of breast: Secondary | ICD-10-CM | POA: Diagnosis not present

## 2022-08-08 DIAGNOSIS — R739 Hyperglycemia, unspecified: Secondary | ICD-10-CM | POA: Insufficient documentation

## 2022-08-08 DIAGNOSIS — Z8616 Personal history of COVID-19: Secondary | ICD-10-CM | POA: Insufficient documentation

## 2022-08-08 DIAGNOSIS — Z8349 Family history of other endocrine, nutritional and metabolic diseases: Secondary | ICD-10-CM | POA: Insufficient documentation

## 2022-08-08 DIAGNOSIS — Z808 Family history of malignant neoplasm of other organs or systems: Secondary | ICD-10-CM | POA: Diagnosis not present

## 2022-08-08 DIAGNOSIS — Z803 Family history of malignant neoplasm of breast: Secondary | ICD-10-CM | POA: Insufficient documentation

## 2022-08-08 NOTE — Assessment & Plan Note (Signed)
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 Jana Hakim who recommended antiestrogen therapy with tamoxifen (lifetime risk of breast cancer 30%) he also recommended annual breast MRIs

## 2022-08-08 NOTE — Progress Notes (Signed)
[facility] NOTE  Patient Care Team: Baxley, Cresenciano Lick, MD as PCP - General (Internal Medicine) Donnel Saxon, CNM as Consulting Physician (Obstetrics and Gynecology) Erroll Luna, MD as Consulting Physician (General Surgery) Magrinat, Virgie Dad, MD (Inactive) as Consulting Physician (Oncology)  CHIEF COMPLAINTS/PURPOSE OF CONSULTATION:  At high risk for breast cancer  HISTORY OF PRESENTING ILLNESS:  Lisa Golden 47 y.o. female is here because of prior history of being high risk for breast cancer.  Previously she was seen by Dr. Jana Hakim who reviewed her pathology report showing intraductal papilloma and complex sclerosing lesion on the lumpectomy specimen and recommended tamoxifen therapy and annual breast MRIs.  She started the treatment in 2021 and has been on it for the past 3 years.  Lately she has noticed elevation in blood sugars and cholesterol and wondered if tamoxifen was the reason for that.  Her primary care physician Dr. Rodney Cruise recommended that she see [place] back to discuss this.  I reviewed her records extensively and collaborated the history with the patient.  MEDICAL HISTORY:  Past Medical History:  Diagnosis Date   Anxiety    Depression    Family history of brain cancer    Family history of breast cancer    Family history of melanoma    History of COVID-19 04/21/2019    SURGICAL HISTORY: Past Surgical History:  Procedure Laterality Date   BREAST LUMPECTOMY WITH RADIOACTIVE SEED LOCALIZATION Left 06/12/2019   Procedure: LEFT BREAST LUMPECTOMY WITH RADIOACTIVE SEED LOCALIZATION;  Surgeon: Erroll Luna, MD;  Location: [facility];  Service: General;  Laterality: Left;   NO PAST SURGERIES      SOCIAL HISTORY: Social History   Socioeconomic History   Marital status: Married    Spouse name: Not on file   Number of children: Not on file   Years of education: Not on file   Highest education level: Not on file  Occupational  History   Not on file  Tobacco Use   Smoking status: Never   Smokeless tobacco: Never  Vaping Use   Vaping Use: Never used  Substance and Sexual Activity   Alcohol use: Yes    Comment: Occasionally   Drug use: No   Sexual activity: Yes    Birth control/protection: None  Other Topics Concern   Not on file  Social History Narrative   Not on file   Social Determinants of Health   Financial Resource Strain: Not on file  Food Insecurity: Not on file  Transportation Needs: Not on file  Physical Activity: Not on file  Stress: Not on file  Social Connections: Not on file  Intimate Partner Violence: Not on file    FAMILY HISTORY: Family History  Problem Relation Age of Onset   Breast cancer Mother 35   Gout Father    Kidney Stones Father    Breast cancer Maternal Grandmother 71   Brain cancer Paternal Uncle        dx. early 57s    ALLERGIES:  has No Known Allergies.  MEDICATIONS:  Current Outpatient Medications  Medication Sig Dispense Refill   ALPRAZolam (XANAX) 0.5 MG tablet Take 1 tablet (0.5 mg total) by mouth 3 (three) times daily as needed for anxiety. 60 tablet 0   Ergocalciferol 2000 UNITS TABS Take 2,000 Units by mouth.     FLUoxetine (PROZAC) 20 MG capsule TAKE 1 CAPSULE BY MOUTH EVERY DAY 90 capsule 1   rosuvastatin (CRESTOR) 10 MG tablet Take 1  tablet (10 mg total) by mouth daily. 90 tablet 3   tamoxifen (NOLVADEX) 20 MG tablet Take 1 tablet by mouth daily.     No current facility-administered medications for this visit.    REVIEW OF SYSTEMS:   Constitutional: Denies fevers, chills or abnormal night sweats  All other systems were reviewed with the patient and are negative.  PHYSICAL EXAMINATION: ECOG PERFORMANCE STATUS: 0 - Asymptomatic  Vitals:   08/08/22 1256  BP: 131/77  Pulse: 92  Resp: 18  Temp: (!) 97.2 F (36.2 C)  SpO2: 100%   Filed Weights   08/08/22 1256  Weight: 190 lb 6.4 oz (86.4 kg)    GENERAL:alert, no distress and  comfortable  LABORATORY DATA:  I have reviewed the data as listed Lab Results  Component Value Date   WBC 6.6 07/17/2022   HGB 12.1 07/17/2022   HCT 36.4 07/17/2022   MCV 89.9 07/17/2022   PLT 284 07/17/2022   Lab Results  Component Value Date   NA 139 07/17/2022   K 3.8 07/17/2022   CL 104 07/17/2022   CO2 25 07/17/2022    RADIOGRAPHIC STUDIES: I have personally reviewed the radiological reports and agreed with the findings in the report.  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 Jana Hakim who recommended antiestrogen therapy with tamoxifen (lifetime risk of breast cancer 30%) he also recommended annual breast MRIs  Current treatment: Tamoxifen started 08/07/2019 I discussed the results of TAM-01 clinical trial which showed benefit of tamoxifen even at lower doses.  Therefore she will reduce the dosage of tamoxifen to 10 mg a day. She discussed whether tamoxifen could be causing her elevated blood sugars and elevated cholesterol.  I explained to her that tamoxifen is not known to cause elevated sugars or cholesterol.  The reason for that might be a genetic and or dietary in nature.  I discussed with her extensively about the importance of eating healthier and exercising regularly and make achievable goals to improve her sugars and cholesterol.  She will complete 5 years of tamoxifen and then discontinue it. We discussed the role of annual breast MRIs.  She has been getting the breast MRIs annually. I recommended that she could now start to get breast MRIs every other year based upon low degree of breast density which is category B on the mammograms as well as multiple negative breast MRIs.  I also discussed the role of gadolinium and its accumulation over time as another reason for getting MRIs every other year.  Return to clinic on  an as-needed basis.    All questions were answered. The patient knows to call the clinic with any problems, questions or concerns.    Harriette Ohara, MD 08/08/22

## 2022-08-13 NOTE — Progress Notes (Unsigned)
Cardiology Office Note:   Date:  08/14/2022  NAME:  Ferman HammingJamie Russell Zwiebel    MRN: [id number] DOB:  30-Jan-1976   PCP:  Margaree MackintoshBaxley, Mary J, MD  Cardiologist:  None  Electrophysiologist:  None   Referring MD: Margaree MackintoshBaxley, Mary J, MD   Chief Complaint  Patient presents with   New Patient (Initial Visit)         History of Present Illness:   Ferman HammingJamie Russell Boyajian is a 47 y.o. female with a hx of depression who is being seen today for the evaluation of family history of heart disease at the request of Baxley, Luanna ColeMary J, MD. dad was diagnosed with hypertrophic obstructive cardiomyopathy, sigmoid variant at age 47.  He did have a surgical myectomy.  Also had CABG.  Had systolic heart failure with recovered ejection fraction as well.  She is on cholesterol-lowering medications.  Calcium score 0.  EKG demonstrates sinus rhythm with poor R wave progression.  She reports no chest pains or trouble breathing.  She did have a breast lump removed.  On prophylactic tamoxifen.  Overall doing well.  Denies any symptoms today.  She does not smoke.  No alcohol or drug use is reported.  She has other siblings who screened negative for hypertrophic obstructive cardiomyopathy.  It does not appear that her father did genetic testing.  I did inform her this may be meaningful.  CV exam normal.  She is married.  Has 1 son.  He is 17.  CAC = 0; 08/2021 T chol 172, HDL 68, LDL 83, TG 111  Past Medical History: Past Medical History:  Diagnosis Date   Anxiety    Depression    Family history of brain cancer    Family history of breast cancer    Family history of melanoma    History of COVID-19 04/21/2019    Past Surgical History: Past Surgical History:  Procedure Laterality Date   BREAST LUMPECTOMY WITH RADIOACTIVE SEED LOCALIZATION Left 06/12/2019   Procedure: LEFT BREAST LUMPECTOMY WITH RADIOACTIVE SEED LOCALIZATION;  Surgeon: Harriette Bouillonornett, Thomas, MD;  Location: [name];  Service: General;  Laterality: Left;    NO PAST SURGERIES      Current Medications: Current Meds  Medication Sig   ALPRAZolam (XANAX) 0.5 MG tablet Take 1 tablet (0.5 mg total) by mouth 3 (three) times daily as needed for anxiety.   Ergocalciferol 2000 UNITS TABS Take 2,000 Units by mouth.   FLUoxetine (PROZAC) 20 MG capsule TAKE 1 CAPSULE BY MOUTH EVERY DAY   rosuvastatin (CRESTOR) 10 MG tablet Take 1 tablet (10 mg total) by mouth daily.   tamoxifen (NOLVADEX) 20 MG tablet Take 1 tablet by mouth daily.     Allergies:    Patient has no known allergies.   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  Tobacco Use   Smoking status: Never   Smokeless tobacco: Never  Vaping Use   Vaping Use: Never used  Substance and Sexual Activity   Alcohol use: Yes    Comment: Occasionally   Drug use: No   Sexual activity: Yes    Birth control/protection: None  Other Topics Concern   Not on file  Social History Narrative   Not on file  Social Determinants of Health   Financial Resource Strain: Not on file  Food Insecurity: Not on file  Transportation Needs: Not on file  Physical Activity: Not on file  Stress: Not on file  Social Connections: Not on file     Family History: The patient's family history includes Brain cancer in her paternal uncle; Breast cancer (age of onset: 31) in her mother; Breast cancer (age of onset: 58) in her maternal grandmother; Gout in her father; Hypertrophic cardiomyopathy in her father; Kidney Stones in her father.  ROS:   All other ROS reviewed and negative. Pertinent positives noted in the HPI.     EKGs/Labs/Other Studies Reviewed:   The following studies were personally reviewed by me today:  EKG:  EKG is ordered today.  The ekg ordered today demonstrates  normal sinus rhythm heart rate 88, no acute ischemic changes or evidence of infarction, and was personally reviewed by me.   Recent Labs: 07/17/2022: ALT 13; BUN 11; Creat 0.73; Hemoglobin 12.1; Platelets 284; Potassium 3.8; Sodium 139; TSH 1.39   Recent Lipid Panel    Component Value Date/Time   CHOL 172 07/17/2022 0909   TRIG 111 07/17/2022 0909   HDL 68 07/17/2022 0909   CHOLHDL 2.5 07/17/2022 0909   VLDL 13 06/02/2016 1216   LDLCALC 83 07/17/2022 0909    Physical Exam:   VS:  BP 126/76 (BP Location: Right Arm, Patient Position: Sitting, Cuff Size: Normal)   Pulse 88   Ht 5\' 9"  (1.753 m)   Wt 191 lb (86.6 kg)   LMP 07/11/2022   BMI 28.21 kg/m    Wt Readings from Last 3 Encounters:  08/14/22 191 lb (86.6 kg)  08/08/22 190 lb 6.4 oz (86.4 kg)  07/20/22 190 lb 1.9 oz (86.2 kg)    General: Well nourished, well developed, in no acute distress Head: Atraumatic, normal size  Eyes: PEERLA, EOMI  Neck: Supple, no JVD Endocrine: No thryomegaly Cardiac: Normal S1, S2; RRR; no murmurs, rubs, or gallops Lungs: Clear to auscultation bilaterally, no wheezing, rhonchi or rales  Abd: Soft, nontender, no hepatomegaly  Ext: No edema, pulses 2+ Musculoskeletal: No deformities, BUE and BLE strength normal and equal Skin: Warm and dry, no rashes   Neuro: Alert and oriented to person, place, time, and situation, CNII-XII grossly intact, no focal deficits  Psych: Normal mood and affect   ASSESSMENT:   Mishka Veerkamp is a 47 y.o. female who presents for the following: 1. Family history of heart disease   2. Nonspecific abnormal electrocardiogram (ECG) (EKG)     PLAN:   1. Family history of heart disease 2. Nonspecific abnormal electrocardiogram (ECG) (EKG) -Family history of hypertrophic obstructive cardiomyopathy, sigmoid variant.  Father underwent surgical myectomy at age 43.  Does not appear he was genetically tested.  We discussed that she should discuss this with her father.   If he gets genetically tested she should be screened genetically.  Her EKG shows nonspecific ST-T changes.  We will set her up for a screening echocardiogram.  Discussed that would recommend to do this every 5 years.  However if he does get genetically tested screening could be done as a one time test if he has genetically positive.  She will see [place] back every 5 years.      Disposition: Return if symptoms worsen or fail to improve.  Medication Adjustments/Labs and Tests Ordered: Current medicines are reviewed at length with the patient today.  Concerns regarding medicines are outlined above.  Orders Placed This Encounter  Procedures   EKG 12-Lead   ECHOCARDIOGRAM COMPLETE   No orders of the defined types were placed in this encounter.   Patient Instructions  Medication Instructions:  The current medical regimen is effective;  continue present plan and medications.   *If you need a refill on your cardiac medications before your next appointment, please call your pharmacy*  Testing/Procedures:  Echocardiogram - Your physician has requested that you have an echocardiogram. Echocardiography is a painless test that uses sound waves to create images of your heart. It provides your doctor with information about the size and shape of your heart and how well your heart's chambers and valves are working. This procedure takes approximately one hour. There are no restrictions for this procedure.    Follow-Up: At [facility], you and your health needs are our priority.  As part of our continuing mission to provide you with exceptional heart care, we have created designated Provider Care Teams.  These Care Teams include your primary Cardiologist (physician) and Advanced Practice Providers (APPs -  Physician Assistants and Nurse Practitioners) who all work together to provide you with the care you need, when you need it.  We recommend signing up for the patient portal called "MyChart".   Sign up information is provided on this After Visit Summary.  MyChart is used to connect with patients for Virtual Visits (Telemedicine).  Patients are able to view lab/test results, encounter notes, upcoming appointments, etc.  Non-urgent messages can be sent to your provider as well.   To learn more about what you can do with MyChart, go to [email or web address].    Your next appointment:   As needed  Provider:   Lennie Odor, MD     Signed, Lenna Gilford. Flora Lipps, MD, [facility]  [facility]  [address], [address] [place], [place] [address] [phone]  08/14/2022 4:00 PM

## 2022-08-14 ENCOUNTER — Encounter: Payer: Self-pay | Admitting: Cardiovascular Disease

## 2022-08-14 ENCOUNTER — Ambulatory Visit: Payer: BC Managed Care – PPO | Attending: Cardiovascular Disease | Admitting: Cardiovascular Disease

## 2022-08-14 VITALS — BP 126/76 | HR 88 | Ht 69.0 in | Wt 191.0 lb

## 2022-08-14 DIAGNOSIS — Z8249 Family history of ischemic heart disease and other diseases of the circulatory system: Secondary | ICD-10-CM | POA: Diagnosis not present

## 2022-08-14 DIAGNOSIS — R9431 Abnormal electrocardiogram [ECG] [EKG]: Secondary | ICD-10-CM | POA: Diagnosis not present

## 2022-08-14 NOTE — Patient Instructions (Signed)
Medication Instructions:  The current medical regimen is effective;  continue present plan and medications.  *If you need a refill on your cardiac medications before your next appointment, please call your pharmacy*   Testing/Procedures:  Echocardiogram - Your physician has requested that you have an echocardiogram. Echocardiography is a painless test that uses sound waves to create images of your heart. It provides your doctor with information about the size and shape of your heart and how well your heart's chambers and valves are working. This procedure takes approximately one hour. There are no restrictions for this procedure.     Follow-Up: At [facility], you and your health needs are our priority.  As part of our continuing mission to provide you with exceptional heart care, we have created designated Provider Care Teams.  These Care Teams include your primary Cardiologist (physician) and Advanced Practice Providers (APPs -  Physician Assistants and Nurse Practitioners) who all work together to provide you with the care you need, when you need it.  We recommend signing up for the patient portal called "MyChart".  Sign up information is provided on this After Visit Summary.  MyChart is used to connect with patients for Virtual Visits (Telemedicine).  Patients are able to view lab/test results, encounter notes, upcoming appointments, etc.  Non-urgent messages can be sent to your provider as well.   To learn more about what you can do with MyChart, go to [email or web address].    Your next appointment:   As needed  Provider:   [place] O'Neal, MD   

## 2022-09-05 ENCOUNTER — Ambulatory Visit (HOSPITAL_COMMUNITY): Payer: BC Managed Care – PPO | Attending: Internal Medicine

## 2022-09-05 DIAGNOSIS — R9431 Abnormal electrocardiogram [ECG] [EKG]: Secondary | ICD-10-CM | POA: Diagnosis not present

## 2022-09-05 LAB — ECHOCARDIOGRAM COMPLETE
Area-P 1/2: 4.8 cm2
Est EF: 60
S' Lateral: 2.7 cm

## 2022-09-05 MED ORDER — PERFLUTREN LIPID MICROSPHERE
1.0000 mL | INTRAVENOUS | Status: AC | PRN
  Administered 2022-09-05: 2 mL via INTRAVENOUS

## 2022-09-07 DIAGNOSIS — Z01419 Encounter for gynecological examination (general) (routine) without abnormal findings: Secondary | ICD-10-CM | POA: Diagnosis not present

## 2022-09-07 DIAGNOSIS — N6320 Unspecified lump in the left breast, unspecified quadrant: Secondary | ICD-10-CM | POA: Diagnosis not present

## 2022-09-07 DIAGNOSIS — F419 Anxiety disorder, unspecified: Secondary | ICD-10-CM | POA: Diagnosis not present

## 2022-09-07 DIAGNOSIS — Z1231 Encounter for screening mammogram for malignant neoplasm of breast: Secondary | ICD-10-CM | POA: Diagnosis not present

## 2022-09-07 DIAGNOSIS — Z803 Family history of malignant neoplasm of breast: Secondary | ICD-10-CM | POA: Diagnosis not present

## 2022-09-07 LAB — HM MAMMOGRAPHY

## 2022-12-14 DIAGNOSIS — E782 Mixed hyperlipidemia: Secondary | ICD-10-CM | POA: Diagnosis not present

## 2022-12-14 DIAGNOSIS — E559 Vitamin D deficiency, unspecified: Secondary | ICD-10-CM | POA: Diagnosis not present

## 2022-12-14 DIAGNOSIS — R7303 Prediabetes: Secondary | ICD-10-CM | POA: Diagnosis not present

## 2023-01-11 DIAGNOSIS — L821 Other seborrheic keratosis: Secondary | ICD-10-CM | POA: Diagnosis not present

## 2023-01-11 DIAGNOSIS — L719 Rosacea, unspecified: Secondary | ICD-10-CM | POA: Diagnosis not present

## 2023-01-11 DIAGNOSIS — D225 Melanocytic nevi of trunk: Secondary | ICD-10-CM | POA: Diagnosis not present

## 2023-01-11 DIAGNOSIS — L578 Other skin changes due to chronic exposure to nonionizing radiation: Secondary | ICD-10-CM | POA: Diagnosis not present

## 2023-02-06 ENCOUNTER — Other Ambulatory Visit: Payer: Self-pay | Admitting: Internal Medicine

## 2023-05-12 ENCOUNTER — Other Ambulatory Visit: Payer: Self-pay | Admitting: Internal Medicine

## 2023-06-15 DIAGNOSIS — E559 Vitamin D deficiency, unspecified: Secondary | ICD-10-CM | POA: Diagnosis not present

## 2023-06-15 DIAGNOSIS — R7303 Prediabetes: Secondary | ICD-10-CM | POA: Diagnosis not present

## 2023-06-15 DIAGNOSIS — E782 Mixed hyperlipidemia: Secondary | ICD-10-CM | POA: Diagnosis not present

## 2023-07-20 ENCOUNTER — Other Ambulatory Visit

## 2023-07-20 ENCOUNTER — Other Ambulatory Visit: Payer: BC Managed Care – PPO | Admitting: Internal Medicine

## 2023-07-20 DIAGNOSIS — Z0000 Encounter for general adult medical examination without abnormal findings: Secondary | ICD-10-CM | POA: Diagnosis not present

## 2023-07-20 DIAGNOSIS — Z8249 Family history of ischemic heart disease and other diseases of the circulatory system: Secondary | ICD-10-CM

## 2023-07-20 DIAGNOSIS — Z6829 Body mass index (BMI) 29.0-29.9, adult: Secondary | ICD-10-CM | POA: Diagnosis not present

## 2023-07-20 DIAGNOSIS — E782 Mixed hyperlipidemia: Secondary | ICD-10-CM

## 2023-07-20 NOTE — Progress Notes (Signed)
 Lab only

## 2023-07-20 NOTE — Progress Notes (Addendum)
 Annual Wellness Visit   Patient Care Team: Raymir Frommelt, Luanna Cole, MD as PCP - General (Internal Medicine) Nigel Bridgeman, CNM as Consulting Physician (Obstetrics and Gynecology) Harriette Bouillon, MD as Consulting Physician (General Surgery) Magrinat, Valentino Hue, MD (Inactive) as Consulting Physician (Oncology)  Visit Date: 07/23/23   Chief Complaint  Patient presents with   Annual Exam    Pt C/O of knot on right foot for a few months; vaccination are due    Subjective:  Patient: Lisa Golden, Female DOB: 10-Jun-1975, 48 y.o. MRN: [id number] Lisa Golden is a 48 y.o. Female who presents today for her Annual Wellness Visit. Patient has history of anxiety/depression, glucose Intolerance and hyperlipidemia.  She mentions a bunion on her right foot. Denied pain when walking or tenderness to palpation. Also mentions that her bilateral tinnitus may be contributing to difficulty hearing while in crowded spaces and seems to be louder in the mornings.  Explained there is not a good treatment for tinnitus available but we can refer her to ENT if desired.   History of Impaired Glucose Tolerance 07/20/2023 HgbA1c is 5.9% which, down from previous result of 6.1%. Followed by Dr. Ocie Cornfield, Endocrinologist .    History of Elevated Cholesterol treated with Rosuvastatin 10 mg daily. 07/20/2023 Lipid Panel: WNL.    History of Anxiety/Depression treated with Prozac 20 mg daily and Xanax 0.5 mg three times daily as needed. She started these medications in 2012. In 2016 she tapered off of Prozac and did well until her son became ill at school.  He had a syncopal episode which was frightening and traumatic for her.  Therefore she went back on Prozac and Xanax at that time.  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 Says this has been helping with the jaw pain.     Labs 07/20/2023 CBC: WNL CMP: WNL  TSH: 1.78  PAP Smear 2023, repeat recommended 2026, and Mammogram 2024  through her GYN, last seen 09/07/2022 - will obtain records. Pelvic/Breast exam deferred. Taking Tamoxifen 5 mg daily due to Fhx of Breast Cancer and S/p Lumpectomy 06/2019 by Dr. 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   Colonoscopy 08/19/2021 per Dr. Charna Elizabeth. Pathology shows one hyperplastic polyp and one sessile serrated adenoma. Recommended repeat in 2026.  Bone Density won't be due until 2042.   Vaccine Counseling: Due for Flu, Covid-19, and Tdap - postponed Flu, declined Covid-19, Tdap given today; UTD on Tdap. Past Medical History:  Diagnosis Date   Anxiety    Depression    Family history of brain cancer    Family history of breast cancer    Family history of melanoma    History of COVID-19 04/21/2019  Medical/Surgical History Narrative:  2020 - Left Breast Biopsy in November for left breast mass at 4:00 which was 1 cm from the nipple showing a complex sclerosing lesion.  Subsequently underwent left lumpectomy and pathology showed ductal papilloma.  No evidence of malignancy. Subsequently was seen by Dr. Darnelle Catalan regarding tamoxifen therapy.  He reviewed her data and calculated she had an elevated risk of developing breast cancer in her lifetime in the 30% range.  Tamoxifen was recommended for risk reduction.  2017 - Influenza in February  1995/1998 - UTIs   1980 - Fractured Right Arm Family History  Problem Relation Age of Onset   Breast cancer Mother 6   Hypertrophic cardiomyopathy Father    Gout Father    Kidney Stones  Father    Brain cancer Paternal Uncle        dx. early 90s   Breast cancer Maternal Grandmother 60  Family History Narrative: Parents living.   Father in good health.  Mother diagnosed with Breast Cancer in her 69s.  1 Brother in good health.  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 Divorced. Lives with significant  other. She has a teenage son. Non-smoker, no alcohol consumption. Has been a patient in this practice since 1999.    2025 - son is now 61 y.o, living with her. He was going to college but not anymore, though is working a PT job at [employer]. She would like her son to find something he is passionate about to work towards.   Review of Systems  Constitutional:  Negative for chills, fever, malaise/fatigue and weight loss.  HENT:  Negative for hearing loss, sinus pain and sore throat.   Respiratory:  Negative for cough, hemoptysis and shortness of breath.   Cardiovascular:  Negative for chest pain, palpitations, leg swelling and PND.  Gastrointestinal:  Negative for abdominal pain, constipation, diarrhea, heartburn, nausea and vomiting.  Genitourinary:  Negative for dysuria, frequency and urgency.  Musculoskeletal:  Negative for back pain, myalgias and neck pain.  Skin:  Negative for itching and rash.  Neurological:  Negative for dizziness, tingling, seizures and headaches.  Endo/Heme/Allergies:  Negative for polydipsia.  Psychiatric/Behavioral:  Negative for depression. The patient is not nervous/anxious.     Objective:  Vitals: There were no vitals taken for this visit. Physical Exam Vitals and nursing note reviewed.  Constitutional:      General: She is not in acute distress.    Appearance: Normal appearance. She is not ill-appearing or toxic-appearing.  HENT:     Head: Normocephalic and atraumatic.     Right Ear: Hearing, tympanic membrane, ear canal and external ear normal.     Left Ear: Hearing, tympanic membrane, ear canal and external ear normal.     Mouth/Throat:     Pharynx: Oropharynx is clear.  Eyes:     Extraocular Movements: Extraocular movements intact.     Pupils: Pupils are equal, round, and reactive to light.  Neck:     Thyroid: No thyroid mass, thyromegaly or thyroid tenderness.     Vascular: No carotid bruit.  Cardiovascular:     Rate and Rhythm: Normal rate  and regular rhythm. No extrasystoles are present.    Pulses:          Dorsalis pedis pulses are 2+ on the right side and 2+ on the left side.     Heart sounds: Normal heart sounds. No murmur heard.    No friction rub. No gallop.  Pulmonary:     Effort: Pulmonary effort is normal.     Breath sounds: Normal breath sounds. No decreased breath sounds, wheezing, rhonchi or rales.  Chest:     Chest wall: No mass.  Abdominal:     Palpations: Abdomen is soft. There is no hepatomegaly, splenomegaly or mass.     Tenderness: There is no abdominal tenderness.     Hernia: No hernia is present.  Musculoskeletal:     Cervical back: Normal range of motion.     Right lower leg: No edema.     Left lower leg: No edema.  Lymphadenopathy:     Cervical: No cervical adenopathy.  Upper Body:     Right upper body: No supraclavicular adenopathy.     Left upper body: No supraclavicular adenopathy.  Skin:    General: Skin is warm and dry.  Neurological:     General: No focal deficit present.     Mental Status: She is alert and oriented to person, place, and time. Mental status is at baseline.     Sensory: Sensation is intact.     Motor: Motor function is intact. No weakness.     Deep Tendon Reflexes: Reflexes are normal and symmetric.  Psychiatric:        Attention and Perception: Attention normal.        Mood and Affect: Mood normal.        Speech: Speech normal.        Behavior: Behavior normal.        Thought Content: Thought content normal.        Cognition and Memory: Cognition normal.        Judgment: Judgment normal.   Most Recent Fall Risk Assessment:    07/23/2023   11:01 AM  Fall Risk   Falls in the past year? 0  Number falls in past yr: 0  Injury with Fall? 0  Risk for fall due to : Other (Comment)  Follow up Falls evaluation completed   Most Recent Depression Screenings:    07/23/2023   11:01 AM 07/27/2022    2:06 PM  PHQ 2/9 Scores  PHQ - 2 Score 0 0   Results:  Studies  Obtained And Personally Reviewed By Me:  PAP Smear 2023.  Mammogram 2024.   Colonoscopy 08/19/2021 per Dr. Charna Elizabeth. Pathology shows one hyperplastic polyp and one sessile serrated adenoma.  Labs:     Component Value Date/Time   NA 138 07/20/2023 1125   K 4.5 07/20/2023 1125   CL 102 07/20/2023 1125   CO2 27 07/20/2023 1125   GLUCOSE 95 07/20/2023 1125   BUN 11 07/20/2023 1125   CREATININE 0.75 07/20/2023 1125   CALCIUM 9.4 07/20/2023 1125   PROT 7.1 07/20/2023 1125   ALBUMIN 4.4 06/09/2019 1100   AST 15 07/20/2023 1125   ALT 13 07/20/2023 1125   ALKPHOS 56 06/09/2019 1100   BILITOT 0.5 07/20/2023 1125   GFRNONAA >60 07/27/2021 1127   GFRNONAA 107 07/12/2020 1138   GFRAA 124 07/12/2020 1138    Lab Results  Component Value Date   WBC 6.1 07/20/2023   HGB 12.9 07/20/2023   HCT 39.6 07/20/2023   MCV 90.4 07/20/2023   PLT 272 07/20/2023   Lab Results  Component Value Date   CHOL 179 07/20/2023   HDL 64 07/20/2023   LDLCALC 92 07/20/2023   TRIG 129 07/20/2023   CHOLHDL 2.8 07/20/2023   Lab Results  Component Value Date   HGBA1C 5.9 (H) 07/20/2023    Lab Results  Component Value Date   TSH 1.78 07/20/2023    Assessment & Plan:   Orders Placed This Encounter  Procedures   Tdap vaccine greater than or equal to 7yo IM  Other Labs Reviewed today: CBC: WNL CMP: WNL  TSH: 1.78  Bunion, Right Foot: Discussed - denied pain when walking or tenderness to palpation. If starts becoming painful or causing pain/difficulty walking let us know.    Bilateral Tinnitus may be contributing to difficulty hearing while in crowded spaces and seems to be louder in the mornings. She would like a hearing test done, referral made for ENT.  Impaired Glucose Tolerance 07/20/2023 HgbA1c, compared to 07/2022: 5.9, down from 6.1. Followed by Dr. Ocie Cornfield, Endocrinologist bi-annually. Interested in going to a weight loss clinic for weight management and glucose intolerance.    Elevated Cholesterol treated with Rosuvastatin 10 mg daily. 07/20/2023 Lipid Panel: WNL.    Anxiety/Depression treated with Prozac 20 mg daily and Xanax 0.5 mg three times daily as needed.   Family hx of breast cancer in mother. GYN has suggested she take Tamoxifen. Patient had complex sclerosing lesion removed in 2020 by Dr. Luisa Hart.  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 Says this has been helping with the jaw pain.     PAP Smear 2023, repeat recommended 2026; Mammogram 2024 through her GYN, last seen 09/07/2022 - will obtain records. Pelvic/Breast exam deferred. Taking Tamoxifen 5 mg daily due to Fhx of Breast Cancer and S/p Lumpectomy 06/2019 by Dr. 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   Colonoscopy 08/19/2021 per Dr. Charna Elizabeth. Pathology shows one hyperplastic polyp and one sessile serrated adenoma. Recommended repeat in 2026.  Bone Density won't be due until 2042.   Vaccine Counseling: Due for Flu, Covid-19, and Tdap - postponed Flu, declined Covid-19, Tdap given today.   Annual wellness visit done today including the all of the following: Reviewed patient's Family Medical History Reviewed and updated list of patient's medical providers Assessment of cognitive impairment was done Assessed patient's functional ability Established a written schedule for health screening services Health Risk Assessent Completed and Reviewed  Discussed health benefits of physical activity, and encouraged her to engage in regular exercise appropriate for her age and condition.    I,Emily Lagle,acting as a Neurosurgeon for Margaree Mackintosh, MD.,have documented all relevant documentation on the behalf of Margaree Mackintosh, MD,as directed by  Margaree Mackintosh, MD while in the presence of Margaree Mackintosh, MD.   I, Margaree Mackintosh, MD, have reviewed all documentation for this visit. The documentation on 08/04/23 for the exam, diagnosis, procedures, and orders are all accurate  and complete.

## 2023-07-21 LAB — CBC WITH DIFFERENTIAL/PLATELET
Absolute Lymphocytes: 2190 {cells}/uL (ref 850–3900)
Absolute Monocytes: 512 {cells}/uL (ref 200–950)
Basophils Absolute: 43 {cells}/uL (ref 0–200)
Basophils Relative: 0.7 %
Eosinophils Absolute: 49 {cells}/uL (ref 15–500)
Eosinophils Relative: 0.8 %
HCT: 39.6 % (ref 35.0–45.0)
Hemoglobin: 12.9 g/dL (ref 11.7–15.5)
MCH: 29.5 pg (ref 27.0–33.0)
MCHC: 32.6 g/dL (ref 32.0–36.0)
MCV: 90.4 fL (ref 80.0–100.0)
MPV: 11.8 fL (ref 7.5–12.5)
Monocytes Relative: 8.4 %
Neutro Abs: 3306 {cells}/uL (ref 1500–7800)
Neutrophils Relative %: 54.2 %
Platelets: 272 10*3/uL (ref 140–400)
RBC: 4.38 10*6/uL (ref 3.80–5.10)
RDW: 12.6 % (ref 11.0–15.0)
Total Lymphocyte: 35.9 %
WBC: 6.1 10*3/uL (ref 3.8–10.8)

## 2023-07-21 LAB — LIPID PANEL
Cholesterol: 179 mg/dL (ref <200)
HDL: 64 mg/dL (ref >=50)
LDL Cholesterol (Calc): 92 mg/dL
Non-HDL Cholesterol (Calc): 115 mg/dL (ref <130)
Total CHOL/HDL Ratio: 2.8 (calc) (ref <5.0)
Triglycerides: 129 mg/dL (ref <150)

## 2023-07-21 LAB — COMPREHENSIVE METABOLIC PANEL
AG Ratio: 1.6 (calc) (ref 1.0–2.5)
ALT: 13 U/L (ref 6–29)
AST: 15 U/L (ref 10–35)
Albumin: 4.4 g/dL (ref 3.6–5.1)
Alkaline phosphatase (APISO): 57 U/L (ref 31–125)
BUN: 11 mg/dL (ref 7–25)
CO2: 27 mmol/L (ref 20–32)
Calcium: 9.4 mg/dL (ref 8.6–10.2)
Chloride: 102 mmol/L (ref 98–110)
Creat: 0.75 mg/dL (ref 0.50–0.99)
Globulin: 2.7 g/dL (ref 1.9–3.7)
Glucose, Bld: 95 mg/dL (ref 65–99)
Potassium: 4.5 mmol/L (ref 3.5–5.3)
Sodium: 138 mmol/L (ref 135–146)
Total Bilirubin: 0.5 mg/dL (ref 0.2–1.2)
Total Protein: 7.1 g/dL (ref 6.1–8.1)

## 2023-07-21 LAB — TSH: TSH: 1.78 m[IU]/L

## 2023-07-21 LAB — HEMOGLOBIN A1C
Hgb A1c MFr Bld: 5.9 %{Hb} — ABNORMAL HIGH (ref <5.7)
Mean Plasma Glucose: 123 mg/dL
eAG (mmol/L): 6.8 mmol/L

## 2023-07-23 ENCOUNTER — Ambulatory Visit (INDEPENDENT_AMBULATORY_CARE_PROVIDER_SITE_OTHER): Payer: BC Managed Care – PPO | Admitting: Internal Medicine

## 2023-07-23 ENCOUNTER — Encounter: Payer: Self-pay | Admitting: Internal Medicine

## 2023-07-23 DIAGNOSIS — E782 Mixed hyperlipidemia: Secondary | ICD-10-CM | POA: Diagnosis not present

## 2023-07-23 DIAGNOSIS — R7302 Impaired glucose tolerance (oral): Secondary | ICD-10-CM | POA: Diagnosis not present

## 2023-07-23 DIAGNOSIS — F419 Anxiety disorder, unspecified: Secondary | ICD-10-CM | POA: Diagnosis not present

## 2023-07-23 DIAGNOSIS — M21611 Bunion of right foot: Secondary | ICD-10-CM

## 2023-07-23 DIAGNOSIS — M26629 Arthralgia of temporomandibular joint, unspecified side: Secondary | ICD-10-CM

## 2023-07-23 DIAGNOSIS — F32A Depression, unspecified: Secondary | ICD-10-CM

## 2023-07-23 DIAGNOSIS — Z0000 Encounter for general adult medical examination without abnormal findings: Secondary | ICD-10-CM

## 2023-07-23 DIAGNOSIS — Z8249 Family history of ischemic heart disease and other diseases of the circulatory system: Secondary | ICD-10-CM

## 2023-07-23 DIAGNOSIS — Z803 Family history of malignant neoplasm of breast: Secondary | ICD-10-CM

## 2023-07-23 DIAGNOSIS — Z23 Encounter for immunization: Secondary | ICD-10-CM

## 2023-07-23 DIAGNOSIS — H9313 Tinnitus, bilateral: Secondary | ICD-10-CM

## 2023-07-31 DIAGNOSIS — J0100 Acute maxillary sinusitis, unspecified: Secondary | ICD-10-CM | POA: Diagnosis not present

## 2023-08-04 NOTE — Patient Instructions (Addendum)
 It was a pleasure to see you today. No change in medications.Continue diet and exercise efforts. Return in one year or as needed.

## 2023-08-14 ENCOUNTER — Other Ambulatory Visit: Payer: Self-pay | Admitting: Internal Medicine

## 2023-08-14 ENCOUNTER — Other Ambulatory Visit: Payer: Self-pay

## 2023-09-12 DIAGNOSIS — Z01419 Encounter for gynecological examination (general) (routine) without abnormal findings: Secondary | ICD-10-CM | POA: Diagnosis not present

## 2023-09-12 DIAGNOSIS — Z1231 Encounter for screening mammogram for malignant neoplasm of breast: Secondary | ICD-10-CM | POA: Diagnosis not present

## 2023-11-26 ENCOUNTER — Encounter: Payer: Self-pay | Admitting: Internal Medicine

## 2023-11-26 ENCOUNTER — Telehealth: Payer: Self-pay | Admitting: Internal Medicine

## 2023-11-26 DIAGNOSIS — H9319 Tinnitus, unspecified ear: Secondary | ICD-10-CM

## 2023-11-26 NOTE — Telephone Encounter (Signed)
 Patient has messaged me that we talked about ENT referral for tinnitus at her physical exam. She would like to do this as she is quite concerned about her hearing. Sending referral to [facility]. MJB, MD

## 2023-11-27 ENCOUNTER — Encounter (INDEPENDENT_AMBULATORY_CARE_PROVIDER_SITE_OTHER): Payer: Self-pay

## 2023-12-07 ENCOUNTER — Ambulatory Visit (INDEPENDENT_AMBULATORY_CARE_PROVIDER_SITE_OTHER): Admitting: Physician Assistant

## 2023-12-07 ENCOUNTER — Encounter (INDEPENDENT_AMBULATORY_CARE_PROVIDER_SITE_OTHER): Payer: Self-pay | Admitting: Physician Assistant

## 2023-12-07 VITALS — BP 114/80 | HR 90

## 2023-12-07 DIAGNOSIS — H9313 Tinnitus, bilateral: Secondary | ICD-10-CM

## 2023-12-07 NOTE — Progress Notes (Signed)
 Dear Dr. Perri, Here is my assessment for our mutual patient, Lisa Golden. Thank you for allowing me the opportunity to care for your patient. Please do not hesitate to contact me should you have any other questions. Sincerely, Lisa Cohen PA-C  Otolaryngology Clinic Note Referring provider: Dr. Perri HPI:  Lisa Golden is a 48 y.o. female kindly referred by Dr. Perri   The patient is a 48 year old female seen in our office for evaluation of tinnitus and hearing loss.  She notes that over the last year she has had bilateral tonal tinnitus and a mid frequency range.  She notes its constant, sometimes it does get louder and somewhat rhythmic but this is occasionally.  She denies any pulsatile nature to this.  No significant pain, dizziness.  No history of trauma to the ears, no noise trauma, no head or neck surgery.  She notes prior to the tinnitus she felt like her hearing was normal but since she has been having tinnitus she feels like it has more difficulty hearing in crowded situations.  She has never had evaluation of her hearing prior.     Independent Review of Additional Tests or Records:  PCP office note on 07/20/2022   PMH/Meds/All/SocHx/FamHx/ROS:   Past Medical History:  Diagnosis Date   Anxiety    Depression    Family history of brain cancer    Family history of breast cancer    Family history of melanoma    History of COVID-19 04/21/2019     Past Surgical History:  Procedure Laterality Date   BREAST LUMPECTOMY WITH RADIOACTIVE SEED LOCALIZATION Left 06/12/2019   Procedure: LEFT BREAST LUMPECTOMY WITH RADIOACTIVE SEED LOCALIZATION;  Surgeon: Vanderbilt Ned, MD;  Location: [facility];  Service: General;  Laterality: Left;   NO PAST SURGERIES      Family History  Problem Relation Age of Onset   Breast cancer Mother 59   Hypertrophic cardiomyopathy Father    Gout Father    Kidney Stones Father    Brain cancer Paternal Uncle        dx. early 83s    Breast cancer Maternal Grandmother 43     Social Connections: Unknown (09/19/2021)   Received from [facility]   Social Network    Social Network: Not on file      Current Outpatient Medications:    ALPRAZolam  (XANAX ) 0.5 MG tablet, Take 1 tablet (0.5 mg total) by mouth 3 (three) times daily as needed for anxiety., Disp: 60 tablet, Rfl: 0   Ergocalciferol  2000 UNITS TABS, Take 2,000 Units by mouth., Disp: , Rfl:    FLUoxetine  (PROZAC ) 20 MG capsule, TAKE 1 CAPSULE BY MOUTH EVERY DAY, Disp: 90 capsule, Rfl: 1   rosuvastatin  (CRESTOR ) 10 MG tablet, TAKE 1 TABLET BY MOUTH EVERY DAY, Disp: 90 tablet, Rfl: 3   tamoxifen (NOLVADEX) 20 MG tablet, Take 1 tablet by mouth daily., Disp: , Rfl:    Physical Exam:   There were no vitals taken for this visit.  Pertinent Findings  CN II-XII intact Bilateral EAC clear and TM intact with well pneumatized middle ear spaces Weber 512: equal Rinne 512: AC > BC b/l  No lesions of oral cavity/oropharynx; dentition wnl No obviously palpable neck masses/lymphadenopathy/thyromegaly No respiratory distress or stridor  Seprately Identifiable Procedures:  None  Impression & Plans:  [name] is a 48 y.o. female with the following   Tinnitus-  48 year old female seen in our office for evaluation of bilateral tinnitus.  She has  no red flags on exam.  No pulsatile nature.  She does note some difficulty hearing in crowded environments suspect she does have some minimal hearing loss.  I would recommend audiological evaluation.  We also advised she may use masking devices to help with the tinnitus, she notes the symptoms are not significantly severe.  If her symptoms worsen she will reach out to the office.  I will call her with the results from the audiological evaluation.   - f/u phone call with audiological results   Thank you for allowing me the opportunity to care for your patient. Please do not hesitate to contact me should you have any other  questions.  Sincerely, Lisa Cohen PA-C [facility] Phone: [phone] Fax: [phone]  12/07/2023, 8:31 AM

## 2023-12-11 DIAGNOSIS — E782 Mixed hyperlipidemia: Secondary | ICD-10-CM | POA: Diagnosis not present

## 2023-12-11 DIAGNOSIS — R7303 Prediabetes: Secondary | ICD-10-CM | POA: Diagnosis not present

## 2023-12-11 DIAGNOSIS — E559 Vitamin D deficiency, unspecified: Secondary | ICD-10-CM | POA: Diagnosis not present

## 2023-12-11 DIAGNOSIS — E669 Obesity, unspecified: Secondary | ICD-10-CM | POA: Diagnosis not present

## 2024-01-09 ENCOUNTER — Encounter: Payer: Self-pay | Admitting: Internal Medicine

## 2024-01-09 ENCOUNTER — Encounter (INDEPENDENT_AMBULATORY_CARE_PROVIDER_SITE_OTHER): Payer: Self-pay

## 2024-01-10 ENCOUNTER — Other Ambulatory Visit: Payer: Self-pay

## 2024-01-10 MED ORDER — ALPRAZOLAM 0.5 MG PO TABS: 0.5000 mg | ORAL_TABLET | Freq: Three times a day (TID) | ORAL | 2 refills | Status: AC | PRN

## 2024-01-10 NOTE — Telephone Encounter (Signed)
 Per patient,   Dr. Perri, I am running low on alprazolam .  I haven't had a refill on this for a few years.  Can you call me in a prescription for it to [facility] on [address] please?     Thanks! Lisa Golden

## 2024-01-15 ENCOUNTER — Ambulatory Visit (INDEPENDENT_AMBULATORY_CARE_PROVIDER_SITE_OTHER): Admitting: Audiology

## 2024-01-15 DIAGNOSIS — Z0110 Encounter for examination of ears and hearing without abnormal findings: Secondary | ICD-10-CM | POA: Diagnosis not present

## 2024-01-15 DIAGNOSIS — H93293 Other abnormal auditory perceptions, bilateral: Secondary | ICD-10-CM

## 2024-01-16 NOTE — Progress Notes (Signed)
  [address] [place], [place] [address] [phone]  Audiological Evaluation    Name: Lisa Golden     DOB:   05/18/1975      MRN:   [id number]                                                                                     Service Date: 01/16/2024     Accompanied by: unaccompanied   Patient comes today after Reyes Cohen, PA-C sent a referral for a hearing evaluation due to concerns with tinnitus.   Symptoms Yes Details  Hearing loss  [x]  Patient may perceive some hearing loss  Tinnitus  [x]  Bilateral with onset one year ago  Ear pain/ infections/pressure  []    Balance problems  []    Noise exposure history  []    Previous ear surgeries  []    Family history of hearing loss  [x]  Father, with age  Amplification  []    Other  []      Otoscopy: Right ear: Clear external ear canal and notable landmarks visualized on the tympanic membrane. Left ear:  Clear external ear canal and notable landmarks visualized on the tympanic membrane.  Tympanometry: Right ear: Type A- Normal external ear canal volume with normal middle ear pressure and tympanic membrane compliance. Left ear: Type A- Normal external ear canal volume with normal middle ear pressure and tympanic membrane compliance.  Distortion Product Otoacoustic Emissions: Equipment not available.  Pure tone Audiometry: Both ears- Normal hearing from  250 Hz - 8000 Hz.  Speech Audiometry: Right ear- Speech Reception Threshold (SRT) was obtained at 20 dBHL. Left ear-Speech Reception Threshold (SRT) was obtained at 20 dBHL.   Word Recognition Score Tested using NU-6 (recorded) Right ear: 100% was obtained at a presentation level of 60 dBHL with contralateral masking which is deemed as  excellent. Left ear: 100% was obtained at a presentation level of 60 dBHL with contralateral masking which is deemed as  excellent.   The hearing test results were completed under headphones and re-checked with inserts  and results are deemed to be of good reliability. Test technique:  conventional    Recommendations: Follow up with ENT as scheduled for today. Return for a hearing evaluation if concerns with hearing changes arise or per MD recommendation. Consider various tinnitus strategies, including the use of a sound generator, and/or tinnitus retraining therapy.    [name], AUD

## 2024-01-31 DIAGNOSIS — N939 Abnormal uterine and vaginal bleeding, unspecified: Secondary | ICD-10-CM | POA: Diagnosis not present

## 2024-02-17 ENCOUNTER — Other Ambulatory Visit: Payer: Self-pay | Admitting: Internal Medicine

## 2024-03-18 DIAGNOSIS — B349 Viral infection, unspecified: Secondary | ICD-10-CM | POA: Diagnosis not present

## 2024-03-20 DIAGNOSIS — N840 Polyp of corpus uteri: Secondary | ICD-10-CM | POA: Diagnosis not present

## 2024-03-20 DIAGNOSIS — N939 Abnormal uterine and vaginal bleeding, unspecified: Secondary | ICD-10-CM | POA: Diagnosis not present

## 2024-03-28 ENCOUNTER — Encounter: Payer: Self-pay | Admitting: Internal Medicine

## 2024-03-31 DIAGNOSIS — N840 Polyp of corpus uteri: Secondary | ICD-10-CM | POA: Diagnosis not present

## 2024-03-31 DIAGNOSIS — N939 Abnormal uterine and vaginal bleeding, unspecified: Secondary | ICD-10-CM | POA: Diagnosis not present

## 2024-05-14 ENCOUNTER — Other Ambulatory Visit: Payer: Self-pay | Admitting: Internal Medicine

## 2024-07-21 ENCOUNTER — Other Ambulatory Visit: Payer: Self-pay

## 2024-07-24 ENCOUNTER — Encounter: Payer: Self-pay | Admitting: Internal Medicine
